# Patient Record
Sex: Female | Born: 1970 | Race: White | Hispanic: No | Smoking: Current some day smoker
Health system: Southern US, Community
[De-identification: ages and names within clinical notes are randomized; demographics above are authoritative.]

## PROBLEM LIST (undated history)

## (undated) DIAGNOSIS — J45909 Unspecified asthma, uncomplicated: Secondary | ICD-10-CM

## (undated) DIAGNOSIS — Q211 Atrial septal defect: Secondary | ICD-10-CM

## (undated) DIAGNOSIS — Z720 Tobacco use: Secondary | ICD-10-CM

## (undated) DIAGNOSIS — Z8709 Personal history of other diseases of the respiratory system: Secondary | ICD-10-CM

## (undated) DIAGNOSIS — I314 Cardiac tamponade: Secondary | ICD-10-CM

## (undated) DIAGNOSIS — Z8679 Personal history of other diseases of the circulatory system: Secondary | ICD-10-CM

## (undated) DIAGNOSIS — F1911 Other psychoactive substance abuse, in remission: Secondary | ICD-10-CM

## (undated) DIAGNOSIS — B1920 Unspecified viral hepatitis C without hepatic coma: Secondary | ICD-10-CM

## (undated) DIAGNOSIS — R413 Other amnesia: Secondary | ICD-10-CM

## (undated) DIAGNOSIS — Q2112 Patent foramen ovale: Secondary | ICD-10-CM

## (undated) DIAGNOSIS — B181 Chronic viral hepatitis B without delta-agent: Secondary | ICD-10-CM

## (undated) DIAGNOSIS — G8929 Other chronic pain: Secondary | ICD-10-CM

## (undated) DIAGNOSIS — I469 Cardiac arrest, cause unspecified: Secondary | ICD-10-CM

## (undated) DIAGNOSIS — N179 Acute kidney failure, unspecified: Secondary | ICD-10-CM

## (undated) DIAGNOSIS — F419 Anxiety disorder, unspecified: Secondary | ICD-10-CM

## (undated) DIAGNOSIS — R519 Headache, unspecified: Secondary | ICD-10-CM

## (undated) DIAGNOSIS — M1990 Unspecified osteoarthritis, unspecified site: Secondary | ICD-10-CM

## (undated) DIAGNOSIS — F32A Depression, unspecified: Secondary | ICD-10-CM

## (undated) DIAGNOSIS — R51 Headache: Secondary | ICD-10-CM

## (undated) DIAGNOSIS — K219 Gastro-esophageal reflux disease without esophagitis: Secondary | ICD-10-CM

## (undated) DIAGNOSIS — I2690 Septic pulmonary embolism without acute cor pulmonale: Secondary | ICD-10-CM

## (undated) DIAGNOSIS — F329 Major depressive disorder, single episode, unspecified: Secondary | ICD-10-CM

## (undated) DIAGNOSIS — I071 Rheumatic tricuspid insufficiency: Secondary | ICD-10-CM

## (undated) DIAGNOSIS — Z87440 Personal history of urinary (tract) infections: Secondary | ICD-10-CM

## (undated) DIAGNOSIS — I38 Endocarditis, valve unspecified: Secondary | ICD-10-CM

## (undated) HISTORY — DX: Personal history of urinary (tract) infections: Z87.440

## (undated) HISTORY — DX: Patent foramen ovale: Q21.12

## (undated) HISTORY — DX: Tobacco use: Z72.0

## (undated) HISTORY — DX: Other chronic pain: G89.29

## (undated) HISTORY — DX: Personal history of other diseases of the respiratory system: Z87.09

## (undated) HISTORY — DX: Atrial septal defect: Q21.1

## (undated) HISTORY — DX: Endocarditis, valve unspecified: I38

## (undated) HISTORY — PX: CARDIAC CATHETERIZATION: SHX172

## (undated) HISTORY — DX: Unspecified viral hepatitis C without hepatic coma: B19.20

## (undated) HISTORY — DX: Other psychoactive substance abuse, in remission: F19.11

## (undated) HISTORY — DX: Chronic viral hepatitis B without delta-agent: B18.1

## (undated) HISTORY — PX: DILATION AND CURETTAGE OF UTERUS: SHX78

## (undated) HISTORY — DX: Headache, unspecified: R51.9

## (undated) HISTORY — DX: Headache: R51

## (undated) HISTORY — DX: Personal history of other diseases of the circulatory system: Z86.79

---

## 1998-03-24 ENCOUNTER — Other Ambulatory Visit: Admission: RE | Admit: 1998-03-24 | Discharge: 1998-03-24 | Payer: Self-pay | Admitting: Obstetrics & Gynecology

## 1998-04-04 ENCOUNTER — Inpatient Hospital Stay (HOSPITAL_COMMUNITY): Admission: AD | Admit: 1998-04-04 | Discharge: 1998-04-04 | Payer: Self-pay | Admitting: Obstetrics and Gynecology

## 1998-04-12 ENCOUNTER — Inpatient Hospital Stay (HOSPITAL_COMMUNITY): Admission: AD | Admit: 1998-04-12 | Discharge: 1998-04-12 | Payer: Self-pay | Admitting: Obstetrics & Gynecology

## 1998-04-18 ENCOUNTER — Inpatient Hospital Stay (HOSPITAL_COMMUNITY): Admission: AD | Admit: 1998-04-18 | Discharge: 1998-04-18 | Payer: Self-pay | Admitting: Obstetrics and Gynecology

## 1998-04-19 ENCOUNTER — Inpatient Hospital Stay (HOSPITAL_COMMUNITY): Admission: AD | Admit: 1998-04-19 | Discharge: 1998-04-22 | Payer: Self-pay | Admitting: Obstetrics and Gynecology

## 1998-06-03 ENCOUNTER — Other Ambulatory Visit: Admission: RE | Admit: 1998-06-03 | Discharge: 1998-06-03 | Payer: Self-pay | Admitting: Obstetrics and Gynecology

## 1998-10-13 ENCOUNTER — Emergency Department (HOSPITAL_COMMUNITY): Admission: EM | Admit: 1998-10-13 | Discharge: 1998-10-13 | Payer: Self-pay | Admitting: Emergency Medicine

## 1998-11-05 ENCOUNTER — Inpatient Hospital Stay (HOSPITAL_COMMUNITY): Admission: EM | Admit: 1998-11-05 | Discharge: 1998-11-06 | Payer: Self-pay | Admitting: Emergency Medicine

## 1999-01-11 ENCOUNTER — Emergency Department (HOSPITAL_COMMUNITY): Admission: EM | Admit: 1999-01-11 | Discharge: 1999-01-11 | Payer: Self-pay | Admitting: Emergency Medicine

## 1999-08-31 ENCOUNTER — Inpatient Hospital Stay (HOSPITAL_COMMUNITY): Admission: AD | Admit: 1999-08-31 | Discharge: 1999-08-31 | Payer: Self-pay | Admitting: Obstetrics

## 1999-09-01 ENCOUNTER — Inpatient Hospital Stay (HOSPITAL_COMMUNITY): Admission: EM | Admit: 1999-09-01 | Discharge: 1999-09-01 | Payer: Self-pay | Admitting: *Deleted

## 1999-09-09 ENCOUNTER — Inpatient Hospital Stay (HOSPITAL_COMMUNITY): Admission: AD | Admit: 1999-09-09 | Discharge: 1999-09-09 | Payer: Self-pay | Admitting: Obstetrics

## 1999-09-09 ENCOUNTER — Emergency Department (HOSPITAL_COMMUNITY): Admission: EM | Admit: 1999-09-09 | Discharge: 1999-09-09 | Payer: Self-pay | Admitting: Emergency Medicine

## 1999-10-28 ENCOUNTER — Inpatient Hospital Stay (HOSPITAL_COMMUNITY): Admission: AD | Admit: 1999-10-28 | Discharge: 1999-10-28 | Payer: Self-pay | Admitting: Obstetrics & Gynecology

## 1999-12-11 ENCOUNTER — Encounter: Payer: Self-pay | Admitting: Emergency Medicine

## 1999-12-11 ENCOUNTER — Emergency Department (HOSPITAL_COMMUNITY): Admission: EM | Admit: 1999-12-11 | Discharge: 1999-12-11 | Payer: Self-pay | Admitting: Emergency Medicine

## 2000-04-12 ENCOUNTER — Emergency Department (HOSPITAL_COMMUNITY): Admission: EM | Admit: 2000-04-12 | Discharge: 2000-04-12 | Payer: Self-pay | Admitting: Emergency Medicine

## 2000-04-13 ENCOUNTER — Emergency Department (HOSPITAL_COMMUNITY): Admission: EM | Admit: 2000-04-13 | Discharge: 2000-04-13 | Payer: Self-pay | Admitting: Emergency Medicine

## 2000-04-13 ENCOUNTER — Encounter: Payer: Self-pay | Admitting: Emergency Medicine

## 2000-06-15 ENCOUNTER — Inpatient Hospital Stay (HOSPITAL_COMMUNITY): Admission: AD | Admit: 2000-06-15 | Discharge: 2000-06-15 | Payer: Self-pay | Admitting: Obstetrics

## 2000-06-21 ENCOUNTER — Emergency Department (HOSPITAL_COMMUNITY): Admission: EM | Admit: 2000-06-21 | Discharge: 2000-06-21 | Payer: Self-pay | Admitting: Emergency Medicine

## 2000-08-10 ENCOUNTER — Emergency Department (HOSPITAL_COMMUNITY): Admission: EM | Admit: 2000-08-10 | Discharge: 2000-08-10 | Payer: Self-pay | Admitting: Emergency Medicine

## 2000-11-08 ENCOUNTER — Emergency Department (HOSPITAL_COMMUNITY): Admission: EM | Admit: 2000-11-08 | Discharge: 2000-11-09 | Payer: Self-pay | Admitting: Emergency Medicine

## 2000-11-09 ENCOUNTER — Emergency Department (HOSPITAL_COMMUNITY): Admission: EM | Admit: 2000-11-09 | Discharge: 2000-11-09 | Payer: Self-pay | Admitting: Emergency Medicine

## 2001-03-23 ENCOUNTER — Emergency Department (HOSPITAL_COMMUNITY): Admission: EM | Admit: 2001-03-23 | Discharge: 2001-03-23 | Payer: Self-pay | Admitting: Emergency Medicine

## 2001-03-23 ENCOUNTER — Encounter: Payer: Self-pay | Admitting: Emergency Medicine

## 2001-08-14 ENCOUNTER — Emergency Department (HOSPITAL_COMMUNITY): Admission: EM | Admit: 2001-08-14 | Discharge: 2001-08-15 | Payer: Self-pay | Admitting: Emergency Medicine

## 2001-09-16 ENCOUNTER — Emergency Department (HOSPITAL_COMMUNITY): Admission: EM | Admit: 2001-09-16 | Discharge: 2001-09-16 | Payer: Self-pay | Admitting: *Deleted

## 2001-09-21 ENCOUNTER — Emergency Department (HOSPITAL_COMMUNITY): Admission: EM | Admit: 2001-09-21 | Discharge: 2001-09-22 | Payer: Self-pay | Admitting: *Deleted

## 2002-09-08 ENCOUNTER — Emergency Department (HOSPITAL_COMMUNITY): Admission: EM | Admit: 2002-09-08 | Discharge: 2002-09-08 | Payer: Self-pay | Admitting: Emergency Medicine

## 2002-10-11 ENCOUNTER — Emergency Department (HOSPITAL_COMMUNITY): Admission: EM | Admit: 2002-10-11 | Discharge: 2002-10-11 | Payer: Self-pay | Admitting: Emergency Medicine

## 2002-11-18 ENCOUNTER — Emergency Department (HOSPITAL_COMMUNITY): Admission: EM | Admit: 2002-11-18 | Discharge: 2002-11-18 | Payer: Self-pay | Admitting: Emergency Medicine

## 2003-02-19 ENCOUNTER — Inpatient Hospital Stay (HOSPITAL_COMMUNITY): Admission: AD | Admit: 2003-02-19 | Discharge: 2003-02-19 | Payer: Self-pay | Admitting: *Deleted

## 2003-08-08 ENCOUNTER — Inpatient Hospital Stay (HOSPITAL_COMMUNITY): Admission: AD | Admit: 2003-08-08 | Discharge: 2003-08-09 | Payer: Self-pay | Admitting: Obstetrics and Gynecology

## 2003-08-08 ENCOUNTER — Encounter: Payer: Self-pay | Admitting: Obstetrics and Gynecology

## 2004-05-02 ENCOUNTER — Emergency Department (HOSPITAL_COMMUNITY): Admission: EM | Admit: 2004-05-02 | Discharge: 2004-05-02 | Payer: Self-pay | Admitting: Emergency Medicine

## 2004-06-21 ENCOUNTER — Emergency Department (HOSPITAL_COMMUNITY): Admission: EM | Admit: 2004-06-21 | Discharge: 2004-06-21 | Payer: Self-pay | Admitting: Emergency Medicine

## 2005-01-16 ENCOUNTER — Emergency Department (HOSPITAL_COMMUNITY): Admission: EM | Admit: 2005-01-16 | Discharge: 2005-01-16 | Payer: Self-pay | Admitting: Emergency Medicine

## 2005-02-08 ENCOUNTER — Emergency Department (HOSPITAL_COMMUNITY): Admission: EM | Admit: 2005-02-08 | Discharge: 2005-02-08 | Payer: Self-pay | Admitting: Emergency Medicine

## 2005-02-26 ENCOUNTER — Emergency Department (HOSPITAL_COMMUNITY): Admission: EM | Admit: 2005-02-26 | Discharge: 2005-02-26 | Payer: Self-pay | Admitting: Emergency Medicine

## 2006-02-24 ENCOUNTER — Encounter: Payer: Self-pay | Admitting: Emergency Medicine

## 2006-02-25 ENCOUNTER — Inpatient Hospital Stay (HOSPITAL_COMMUNITY): Admission: EM | Admit: 2006-02-25 | Discharge: 2006-06-14 | Payer: Self-pay | Admitting: Internal Medicine

## 2006-02-25 ENCOUNTER — Ambulatory Visit: Payer: Self-pay | Admitting: Pulmonary Disease

## 2006-02-25 ENCOUNTER — Ambulatory Visit: Payer: Self-pay | Admitting: Cardiology

## 2006-02-28 ENCOUNTER — Ambulatory Visit: Payer: Self-pay | Admitting: Emergency Medicine

## 2006-02-28 ENCOUNTER — Ambulatory Visit: Payer: Self-pay | Admitting: Infectious Diseases

## 2006-02-28 ENCOUNTER — Ambulatory Visit: Payer: Self-pay | Admitting: Internal Medicine

## 2006-03-04 ENCOUNTER — Encounter: Payer: Self-pay | Admitting: Internal Medicine

## 2006-04-12 ENCOUNTER — Ambulatory Visit: Payer: Self-pay | Admitting: Infectious Diseases

## 2006-04-22 HISTORY — PX: PATENT FORAMEN OVALE CLOSURE: SHX2181

## 2006-04-22 HISTORY — PX: TRICUSPID VALVE SURGERY: SHX817

## 2006-05-07 HISTORY — PX: SUBXYPHOID PERICARDIAL WINDOW: SHX5075

## 2006-05-24 ENCOUNTER — Ambulatory Visit: Payer: Self-pay | Admitting: Infectious Diseases

## 2006-06-02 ENCOUNTER — Ambulatory Visit: Payer: Self-pay | Admitting: Physical Medicine & Rehabilitation

## 2006-06-15 ENCOUNTER — Inpatient Hospital Stay (HOSPITAL_COMMUNITY): Admission: EM | Admit: 2006-06-15 | Discharge: 2006-07-01 | Payer: Self-pay | Admitting: Emergency Medicine

## 2006-06-16 ENCOUNTER — Ambulatory Visit: Payer: Self-pay | Admitting: Dentistry

## 2009-08-06 ENCOUNTER — Telehealth: Payer: Self-pay | Admitting: Internal Medicine

## 2009-08-11 ENCOUNTER — Ambulatory Visit: Payer: Self-pay | Admitting: Cardiovascular Disease

## 2009-09-16 ENCOUNTER — Ambulatory Visit: Payer: Self-pay | Admitting: Cardiovascular Disease

## 2010-11-22 ENCOUNTER — Encounter: Payer: Self-pay | Admitting: Internal Medicine

## 2011-03-16 NOTE — Assessment & Plan Note (Signed)
[facility]                        [place] CARDIOLOGY OFFICE NOTE   Fitzgerald, Tina                       MRN:          [id number]  DATE:09/16/2009                            DOB:          12-31-1970    PROBLEM LIST:   1. h/o TV endocarditis with complicated course  2. PFO s/p closure  3. Hep B & C   INTERVAL HISTORY:  The patient states that since her last visit her  chest pain has greatly improved.  She no longer experiences any chest  discomfort during the day.  She still experiences some chest discomfort  at night when lying down, but it does not significantly bother her.  She  does housework during the day and is able to complete this without any  significant dyspnea on exertion.  She does not perform any other  significant physical activity.  She also continues to smoke cigarettes.   PHYSICAL EXAMINATION:  VITAL SIGNS:  Blood pressure 117/78, pulse 59,  sating 98% on room air, weight is 160 pounds.  GENERAL:  No acute distress.  HEENT:  Normocephalic, atraumatic.  HEART:  Very distant heart sounds.  There is regular rate and rhythm.  LUNGS:  Decreased breath sounds left base otherwise clear bilaterally.  ABDOMEN:  Soft, nontender.  EXTREMITIES:  Without edema.  SKIN:  Warm and dry.  PSYCHIATRIC:  The patient is appropriate.   LABORATORY DATA:  Review of the patient's chest x-ray shows no evidence  of cardiovascular disease, bilateral mammography showed no evidence of  malignancy, transthoracic echocardiogram showed an ejection fraction of  55-65%, and mildly dilated right ventricle and right atrium with  moderate-to-severe TR.  Of note, the patient's echocardiogram from the  2007 also showed dilated right heart and severe tricuspid regurgitation.  The patient's blood cultures have been negative x2.   ASSESSMENT AND PLAN:  The patient appears to do well from cardiovascular  standpoint.  Her chest pain that was musculoskeletal in  origin seems to  have resolved except for some persistent discomfort at night with lying  down.  Her workup for recurrent endocarditis has been negative and the  patient is not having any current signs or symptoms consistent with that  diagnosis.  She continues to smoke cigarettes and she is encouraged to  consider quitting;  however, considering her history of polysubstance abuse in the past, she  has made significant  strides in this regard, as she has been clean for approximately 4 years.  The patient is encouraged to obtain a primary care physician and she  states they will do so.     Brayton El, MD  Electronically Signed    SGA/MedQ  DD: 09/16/2009  DT: 09/17/2009  Job #: [id number]

## 2011-03-16 NOTE — Assessment & Plan Note (Signed)
[facility]                        [place] CARDIOLOGY OFFICE NOTE   Tina Fitzgerald, Tina Fitzgerald                       MRN:          [id number]  DATE:08/11/2009                            DOB:          12/05/1970    CHIEF COMPLAINT:  Chest pain and fatigue.   HISTORY OF PRESENT ILLNESS:  Tina Fitzgerald is a 40 year old white female  with past medical history significant for tricuspid valve methicillin-  sensitive endocarditis, cardiac tamponade, PFO status post surgical  closure, hepatitis B and C, history of polysubstance abuse, who is  presenting with right- sided chest pain as well as increased fatigue  over the past 4 weeks.  The patient states that for the past 4 weeks,  she has had a discomfort beneath the medial aspect of her right breast.  The pain is often exacerbated by bending over and lying down at night.  She also, however, feels the pain intermittently throughout the day.  She states it can last hours at that time and is often worsened by  movement of her torso.  She denies any radiation of the pain or  associated symptoms, although she does state that she has some chronic  issues with dizziness.  The patient also states that for the past 4  weeks, she has had significantly increased fatigue.  She denies any  increase in dyspnea on exertion, lower extremity edema, syncope, PND, or  orthopnea.  She also denies any fevers, but does occasionally have some  chills.  She has not seen a physician since 2007 when she had surgical  debridement of her tricuspid valve regurgitation, had a  pericardiocentesis, and surgical closure of a patent foramen ovale.  The  patient has a history of polysubstance abuse, but has not used any drugs  since then and prior to the past 4 months, she was in her normal state  of health.  Lastly, the patient does deny any trauma to the area.   PAST MEDICAL HISTORY:  Methicillin-sensitive tricuspid valve  endocarditis, status  post debridement of the valve.  The postop course  was complicated by pericardial tamponade.  The patient also had a PFO  that was closed during the surgery.  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  She  had a large pericardial effusion is status post subxiphoid window.  She  has a history of enterococcal bacteremia and fungemia, history of  hepatitis B and C, history of acute renal failure, ventilatory dependent  respiratory failure, history of septic pulmonary emboli.   SOCIAL HISTORY:  History of polysubstance abuse, however, she now lives  with her mother and has not used substances in the past 3 years.  She  does smoke tobacco, but denies any alcohol use.   FAMILY HISTORY:  Negative for premature coronary disease and negative  for malignancy.   ALLERGIES:  No known drug allergies.   MEDICATIONS:  The patient is currently not taking any medications except  Goody Powders p.r.n.   REVIEW OF SYSTEMS:  The patient states that her appetite has been  increased recently in the evenings.  She also endorses anxiety-like  symptoms and occasional headaches.  Other systems as in HPI, otherwise  negative.   PHYSICAL EXAMINATION:  VITAL SIGNS:  Blood pressure is 116/80, pulse is  72, she weighs 161 pounds, and she is sating 99% on room air.  GENERAL:  She is in no acute distress.  HEENT:  Nonfocal.  NECK:  Supple.  There are no carotid bruits.  There is no JVD.  HEART:  Regular rate and rhythm with a 1/6 systolic murmur heard at the  left sternal border.  LUNGS:  Decreased breath sounds at the left base, otherwise clear to  auscultation.  ABDOMEN:  Soft and nontender.  EXTREMITIES:  Without edema.  SKIN:  Cool and dry.  PSYCHIATRIC:  The patient is appropriate.  NEURO:  The patient does have some gait instability.  MUSCULOSKELETAL:  The patient has tenderness to palpation over the  medial portion of the right breast  extending to the sternum.  There are  no masses in that area that are palpated, but the breast tissue does  appear to be tender.   EKG from today independently reviewed by myself demonstrates normal  sinus rhythm with a incomplete right-bundle branch block.  Review of the  patient's medical history as above.   ASSESSMENT AND PLAN:  1. Chest discomfort.  The patient's chest discomfort seems      reproducible on exam.  It is an area with overlying breast tissue.      We will proceed with a PA and lateral chest x-ray as well as a      screening mammography to further evaluate this.  It certainly      appears not to be cardiac in origin.  We recommend that she take      anti-inflammatory medications such as Aleve for this problem.  2. Increased fatigue and other constitutional symptoms.  The patient      has a history of endocarditis and is therefore at increase risk for      recurrence.  Today, we will check an echocardiogram, blood cultures      x2, CMP, CBC, TSH.  We will see the patient back in clinic after      the results of these studies are obtained.     Brayton El, MD  Electronically Signed    SGA/MedQ  DD: 08/11/2009  DT: 08/12/2009  Job #: [id number]

## 2011-03-19 NOTE — Discharge Summary (Signed)
NAME:  Tina Fitzgerald, Tina Fitzgerald                   ACCOUNT NO.:  [id number]   MEDICAL RECORD NO.:  [id number]                   PATIENT TYPE:   LOCATION:                                       FACILITY:   PHYSICIAN:  Phil D. Rose, M.D.                  DATE OF BIRTH:  1970-12-26   DATE OF ADMISSION:  08/08/2003  DATE OF DISCHARGE:  08/09/2003                                 DISCHARGE SUMMARY   DATE THE PATIENT LEFT THE HOSPITAL:  August 09, 2003   HOSPITAL COURSE:  The patient is a 40 year old gravida 56, para 3-2-4-5 with  four therapeutic abortions at 30-5/7 weeks came into the MAU with fever,  nausea, vomiting for two weeks, marked dehydration with a history of illicit  drug use heroin and cocaine, refused a drug screen, and was admitted for  hydration and diagnosis.  The patient, however, the next day left the  hospital against medical advice and it is not known whether she will appear  in the clinic as directed or not.                                               Phil D. Okey Dupre, M.D.    PDR/MEDQ  D:  09/20/2003  T:  09/20/2003  Job:  [id number]

## 2011-03-19 NOTE — Consult Note (Signed)
NAMELUDELLA, PRANGER NO.:  [id number]   MEDICAL RECORD NO.:  [id number]          PATIENT TYPE:  INP   LOCATION:  5024                         FACILITY:  [facility]   PHYSICIAN:  Charlynne Pander, D.D.S.DATE OF BIRTH:  1971-04-30   DATE OF CONSULTATION:  06/16/2006  DATE OF DISCHARGE:                                   CONSULTATION   Tina Fitzgerald is a 40 year old female referred by Dr. Hannah Beat for a  dental consultation.  Patient with a history of falling and having trauma to  the mid face region where she suffered multiple abrasions and lacerations  and trauma to maxillary tooth #9.  Dental consultation requested to evaluate  the dental trauma.   MEDICAL HISTORY:  1. Status post fall with questionable syncope.  The patient suffered      multiple traumas including abrasions, lacerations, fracture of tooth #9      and a minimally displaced fracture of the frontal process of the left      maxilla per CT scan.  2. Cognitive defect possibly from previous anoxic injury.  3. History of tricuspid valve endocarditis with the previous Enterococcus      infection.  The patient is status post tricuspid valve debridement with      Dr. Tressie Stalker.  The patient does need antibiotic premed prior to      invasive dental procedures as part of the [employer] Association      guidelines.  4. History of IV drug abuse.  5. History of being hepatitis B and hepatitis C positive.  6. History of anemia of chronic disease.  7. History of septic emboli the lungs.  8. History of back pain.   ALLERGIES/DRUG REACTIONS:  None known.   MEDICATIONS:  1. Depakote 250 mg 3 times daily.  2. Lexapro 10 mg daily.  3. Senokot-S one at bedtime.  4. Nicotine patch daily.   SOCIAL HISTORY:  A patient with a history of previous IV drug abuse.  Apparently she has not had access to this since her previous  hospitalization.  Mother appears to be involved in her care and can be  contacted at [phone], extension [phone] or cell phone [phone].  The mother's  name is Lemont Fillers.   FAMILY HISTORY:  Noncontributory.   FUNCTION ASSESSMENT:  The patient is dependent for multiple ADLs.   REVIEW OF SYSTEMS:  This is reviewed as best possible from health history  assessment form and previous hospitalization notes.   DENTAL HISTORY:   CHIEF COMPLAINT:  Patient with a history of fall where she suffered multiple  abrasions and lacerations and trauma to tooth #9.  CT scan was done and  found to have minimally displaced fracture of the frontal process of the  left maxilla.  This was discuss with Dr. Narda Bonds and no other treatment  was deemed to be necessary.  Dental consultation requested for evaluation of  a loose tooth #9.   The patient does not appear to be a very good historian at this time and  indicates that she did see a  dentist approximately 1 year ago.  The patient  indicates that she lost a crown or traumatized tooth number 9 during the  fall.  The patient denies having any dental pain at this time from this  tooth.  The tooth is somewhat loose and may be suffering from a root  fracture.  Dental x-ray will be ordered at this time.   DENTAL EXAMINATIONS:  GENERAL:  The patient is a well-developed, slightly  built female in no acute distress.  VITAL SIGNS:  Blood pressure is 96/68, pulse 93, respirations are 20,  temperature is 97.1.  On exam, there is no palpable submandibular lymphadenopathy.  There are no  acute TMJ symptoms.  The patient denies sensitivity to the TMJ areas.  INTRAORAL EXAM:  The patient has normal saliva.  There is no evidence of  abscess formation within the mouth.  The occlusion appears to be acceptable  and there do not appear to be any steps within the remaining maxilla and  mandible areas.  DENTITION:  Tooth #9 coronal segment appears to be loose.  The patient may  be suffering from a loose core portion of the post and core or there  may be  some form of root fracture.  Dental x-rays will be required.  The patient is  missing multiple teeth and appears to be status post orthodontic therapy.  Occlusion appears to be intact at this time.  PERIODONTAL:  Patient with chronic periodontitis with plaque and calculus  accumulations  DENTAL CARIES:  There are no obvious dental caries noted at this time.  ENDODONTIC:  The patient currently denies acute pulpitis symptoms.  Tooth #9  appears to have had a previous root canal therapy and either has a fractured  core portion of a post and core or possible root fracture.  Definitive  dental x-ray will be required.  CROWN OR BRIDGE:  The patient may have had a crown on tooth #9 previously  but this is no longer in place.  PROSTHODONTIC:  There are no dentures.  OCCLUSION:  The occlusion appears to be stable at this time with no evidence  of mandible fracture.  I am unable to palpate any significant steps or  alveolar fracture at this time.   RADIOGRAPHIC INTERPRETATION:  Panoramic x-ray was ordered for June 16, 2006 through the Department of Radiology.   There are multiple missing teeth.  The patient appears to have had  orthodontics therapy with closing of multiple spaces.  There is no obvious  periapical pathology.  Tooth #9 may be suffering from a root fracture and a  periapical radiographs is recommended.  This will need to be done in a  hospital dental clinic as can be arranged at [facility].   ASSESSMENT:  1. History of dental trauma from a fall.  Patient with multiple facial      lacerations and abrasions and trauma to tooth #9.  Will need dental      periapical radiographs at the hospital dental clinic at [facility].      Will discuss with Dr. Angelena Sole to arrange further evaluation of this      tooth.  It may need to be extracted or may need to follow up with a      dentist of her choice for evaluation for further root canal therapy     post-and-core fabrication and  subsequent new crown fabrication.  2. Chronic periodontitis with bone loss.  3. Plaque and calculus accumulations.  4. Multiple missing  teeth status post orthodontic therapy with an      apparently stable occlusion.  5. 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  Need to      assess who is the patient's decision maker at this time.  6. History of hepatitis B and C with risk for bleeding with invasive      dental procedures.  7. History of tricuspid valve endocarditis and need for antibiotic      premedication prior to invasive dental procedures   PLAN/RECOMMENDATIONS:  I discussed the risks, benefits and complications of  the various treatment options with the patient in relationship to her  medical and dental conditions. The next is to further evaluate the trauma to  the maxillary anterior area with selective dental x-rays.  The patient will  need to be transported via [facility] to the hospital dental clinic as time  and space can be arranged to allow for dental x-rays and treatment as  indicated.  I will discuss with Dr. Angelena Sole further.  In the meantime, the  patient denies acute pain from this area and this is consistent with  previous root canal therapy to the area.  This tooth should pose minimal  risk for aspiration but this needs to be followed closely.      Charlynne Pander, D.D.S.  Electronically Signed     RFK/MEDQ  D:  06/17/2006  T:  06/17/2006  Job:  [id number]   cc:   Hettie Holstein, D.O.

## 2011-03-19 NOTE — Op Note (Signed)
Tina Fitzgerald, Tina Fitzgerald NO.:  [id number]   MEDICAL RECORD NO.:  [id number]          PATIENT TYPE:  INP   LOCATION:  5024                         FACILITY:  [facility]   PHYSICIAN:  Charlynne Pander, D.D.S.DATE OF BIRTH:  1970-11-25   DATE OF PROCEDURE:  06/20/2006  DATE OF DISCHARGE:                                 OPERATIVE REPORT   PREOPERATIVE DIAGNOSES:  1. Status post fall with trauma to the maxillary anterior tooth #9.  2. Vertical root fracture making tooth #9 nonrestorable.   POSTOPERATIVE DIAGNOSES:  1. Status post fall with trauma to the maxillary anterior tooth #9.  2. Vertical root fracture making tooth #9 nonrestorable.   PROCEDURES:  1. Dental examination.  2. Periapical radiographs of tooth #9.  3. Surgical extraction of tooth #9 with alveoloplasty.   SURGEON:  Charlynne Pander, D.D.S.   ASSISTANT:  Elliot Dally (Sales executive).   ANESTHESIA:  Local anesthesia with a total utilization of 36 mg of Xylocaine  with 0.018 mg of epinephrine.   MEDICATIONS:  1. Amoxicillin 2.0 grams by mouth 1 hour prior to invasive dental      procedures.  2. Local anesthesia as above.   SPECIMENS:  There was one root segment which was discarded.   CULTURES:  None.   DRAINS:  None.   COMPLICATIONS:  None.   ESTIMATED BLOOD LOSS:  Less than 15 mls.   FLUIDS:  None.   INDICATIONS:  The patient was admitted secondary to a fall with multiple  facial lacerations and abrasions with trauma to tooth #9.  The patient was  examined at [facility] room 3708.  At that time tooth  #9 was loose; and a periapical radiograph needed to be obtained to determine  if the core portion was loose or if there was a root fracture.  The patient  was then scheduled to be transported by [facility] to the hospital dental  clinic for dental x-ray, examination, and discussion of treatment options.  A periapical radiograph was obtained today; and with  examination, it was  determined that tooth #9 was not restorable due to a vertical root fracture  measuring at least 7 mm below the gum line.  The various treatment options  were discussed with the patient.  The various treatment options and  potential complications and risks and benefits were also discussed with the  mother via the phone.  Both gave consent for surgical extraction with  alveoloplasty as indicated.  Both agreed that they would followup with a  general dentist of their choice for evaluation for replacement of the tooth  with a bridge or with an implant and crown.  The patient, therefore, was  noted to be affected by history of dental trauma with a vertical root  fracture making the tooth #9 nonrestorable..   OPERATIVE FINDINGS:  The patient was examined in the dental clinic.  Tooth  #9 was identified for extraction.  The patient was noted be affected by  dental trauma and a vertical root fracture, making the tooth nonrestorable.  The aforementioned necessitated removal of tooth #  9, at this time.   DESCRIPTION OF PROCEDURE:  The patient was brought to the dental clinic.  A  dental x-ray and examination were performed.  An informed consent was  obtained through the patient and through the mother.  The patient was then  ready to proceed with a dental extraction.  The patient was prepped and  draped in the usual manner for a dental medicine procedure.  The patient was  anesthetized utilizing infiltration on the facial and palatal aspects of  tooth #9 with 36 mg of Xylocaine with 0.018 mg of epinephrine.  A Woodson  elevator was utilized to remove the soft tissue from the hard tissue.  The  piece of the distal root was then removed with a rongeurs.  Tooth #9 was  then subluxated carefully with a series of small elevators.  Tooth #9 was  then carefully removed atraumatically with a 150 forceps without further  complications.  The socket was curetted and compressed appropriately.   The  socket was irrigated with sterile saline.  A piece of Gelfoam was placed in  the extraction socket to aid hemostasis.  Surgical site was then closed  utilizing 3-0 chromic gut suture in a figure-of-eight suture technique x1.  The patient was given postop instructions, verbally; and these instructions  were then placed on the hospital chart.  The patient then had a 4 x 4 gauze  placed to aid hemostasis.  The patient was instructed to avoid straws and  vigorous swishing, spitting, or smoking at this time.  The suture will  dissolve by itself in approximately 1 week.  There were no complications  with the procedure.  The patient tolerated the procedure well.  Postop blood  pressure and pulse were acceptable.  The patient then had a list of postop  instructions placed on the hospital chart.  The patient will be stable for  discharge from a dental standpoint at this time.      Charlynne Pander, D.D.S.  Electronically Signed     RFK/MEDQ  D:  06/20/2006  T:  06/20/2006  Job:  [id number]   cc:   Hettie Holstein, D.O.

## 2011-03-19 NOTE — Consult Note (Signed)
[name], Fitzgerald NO.:  [id number]   MEDICAL RECORD NO.:  [id number]          PATIENT TYPE:  INP   LOCATION:  5024                         FACILITY:  [facility]   PHYSICIAN:  Antonietta Breach, M.D.  DATE OF BIRTH:  10/30/1971   DATE OF CONSULTATION:  06/23/2006  DATE OF DISCHARGE:                                   CONSULTATION   [name] continues with impaired short-term memory.  She demonstrates  impaired judgment by not being able to follow medical recommendations,  although she does not have the capacity to function independently outside  the hospital, she continues to forget that she has been instructed to stay  on the ward and she repeatedly tries to go out on the elevators.   She told a nurse today, as well as the undersigned, that she had been out of  the hospital for 5 hours and had been driven by a female friend around town to  run some errands.  See the mental status below.   The patient's mood is within normal limits.  She is not combative.  She is  not having any hallucinations or delusions.  She is redirectable.  She is  not having any adverse psychotropic effects.   Mental status exam:  [name] is alert.  She is demonstrating some mild  distractibility on concentration.  Thought process involves a partial  confabulation, particularly when she does not recall things.   Thought content:  No thoughts of harming herself, no thoughts of harming  others.  No delusions, no hallucinations.  She has short-term deficits and  cannot recall what date it is.  She does know it is August.  She does not  know the day of the month.  When she was asked where she was at, she said  [place] and then was able to correct herself as the conversation  proceeded and she realized that she was in a hospital.  Then, she was able  to say the name of the hospital.  Her judgment is impaired.  Her insight is  poor.   ASSESSMENT:  1. Unspecified persistent mental  disorder, NOS-294.9.  2. The patient has some dementia symptoms.  3. Bipolar disorder not otherwise specified.  4. Cocaine dependence.   RECOMMENDATIONS:  1. Would continue the Depakote at 250 mg t.i.d. as the primary mood      stabilizer with periodic checking of CBC and LFTs for Depakote      screening.  2. Would continue the Lexapro 10 mg daily for anti-depression and would      schedule the patient with the [facility] for      psychotropic medication follow up.  3. Would also schedule the patient for alcohol drug services follow up,      ADS.  4. The patient's capacity to respond to self-help groups such as 12-step      method and psychotherapy is limited at this time.  However, if her      capacity improves, she would then again be a candidate for 12-step  groups and substance abuse counseling.  5. Would confirm that the reversible memory dysfunction etiology workup      has been completed including RPR, thyroid function tests, B12, and      folic acid.  6. Would consider Aricept and/or Namenda therapy if the reversible memory      workup is negative.   DISCUSSION:  [name] has critical deficits in short-term memory,  reasoning, and judgment.  She does not have the capacity for informed  consent.  She does not have the capacity to provide guardianship.  She also  does not have the capacity to take care of administrative and financial  affairs.      Antonietta Breach, M.D.  Electronically Signed     JW/MEDQ  D:  06/23/2006  T:  06/24/2006  Job:  [id number]

## 2011-03-19 NOTE — H&P (Signed)
NAMEMATRACA, HUNKINS NO.:  [id number]   MEDICAL RECORD NO.:  [id number]          PATIENT TYPE:  INP   LOCATION:  1829                         FACILITY:  [facility]   PHYSICIAN:  Hettie Holstein, D.O.    DATE OF BIRTH:  09/10/1971   DATE OF ADMISSION:  06/15/2006  DATE OF DISCHARGE:                                HISTORY & PHYSICAL   PRIMARY CARE PHYSICIAN:  Unassigned.   CHIEF COMPLAINT:  Status post fall, questionable syncope.   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  For  full details, please refer to the recent summations done over the past few  weeks and also in the past medical history outlined further below.   Tina Fitzgerald was discharged in medically stable condition to [facility].  She had suffered a mild anoxic injury during her recent  hospitalization felt to be related to hypoperfusion in the setting of a  cardiac tamponade, who subsequently underwent a pericardial window with  resolution.  In any event, at the baseline she had initially appeared with  some nonspecific cognitive deficits.  She had seen Psychiatry, Dr.  Jeanie Sewer, during her hospitalization, who had initiated Depakote and  Lexapro to continue for at least the next month.  In any event, she had been  doing fairly well, according to her; however, her history is again not  absolutely reliable.  She stated that she had been walking around doing  fairly well since her discharge.  [facility] unfortunately has declined  to  reaccept her at the assisted living due to this wandering behavior.  In any  event, she presents with no other companions to provide further history.  In  any event, history is provided by notes recorded by the ED nursing staff in  addition to EMS record that reported that she was walking in front of [employer] [address] and reportedly tripped on the curb.  She fell forward onto her  face, nose, and the knees.  She denied loss of consciousness; in fact, she  had abrasions on her outstretched hand indicating that she did try to catch  herself.  She was oriented at the time of their evaluation.  She had some  abrasions on her forehead, bridge of the nose, and had a laceration on her  upper lip.  She had abrasions on her knees bilaterally.  In the emergency  department, she was evaluated by Dr. Lynelle Doctor.  She had a cervical brace  placed.  She underwent CT scanning of her head as well as a cervical spine  film, plain x-rays of her hands.  There was no evidence of fractures with  the exception of a minimally displaced fracture of  the frontal process of  her left maxilla.  I discussed these findings with the on-call ENT, Dr.  Ezzard Standing, who felt that this was not requiring of surgical intervention and  only conservative management from here.  He did recommend dental followup,  which we will attempt to coordinate.   PAST MEDICAL HISTORY:  As below.  1. Tina Fitzgerald had suffered a cardiac arrest with a transient      anoxic injury with nonspecific cognitive deficit that has minimally      improved throughout her course.  She is alertable and orientable;      however, she does have a poor retention and exhibits some confabulation      perhaps to make up for the short term memory deficits.  Apparently she      has been wandering at the previous facility, which was not behavior      noted here during her prolonged course.  2. Recent prolonged hospital course with transient courses on a mechanical       ventilator due to an asystolic arrest presumed to be related to cardiac      tamponade.  She had septic pulmonary emboli initially and an empyema      for which she underwent chest tube drainage with remarkable resolution.  3. History of tricuspid valve endocarditis with a Methicillin-SENSITIVE      Staph aureus and an Enterococcus bacteremia as well as a fungemia.  She      had vegetation on her septal leaflet.  She underwent median sternotomy      and debridement for cardiothoracic surgery by Dr. Tressie Stalker on April 22, 2006.  Subsequently, she did undergo a series of cardiac      catheterizations and assessments by Dr. Gala Romney of [employer] [facility].  The surgery on April 22, 2006, preserved the anterior and      posterior leaflets of her tricuspid valve.  In addition, she underwent      closure of a patent foramen ovale during that operation.  On May 07, 2006, she required a subxiphoid window by Dr. Dorris Fetch for a large      pericardial effusion.  In addition, on May 19, 2006, she underwent      drainage of her pericardial effusion with 800 ml of fluid evacuated at      that time.  She underwent serial echocardiogram followups with the last      one revealing no evidence of a pericardial effusion on a two-D      echocardiogram dated June 13, 2006.  She was noted to have a      moderately dilated right ventricle, moderately reduced right      ventricular systolic function, and severe tricuspid regurgitation.  4. She did have a history of Enterococcal bacteremia as well as fungemia      under which she went treatment per recommendations of Infectious      Disease.  She is to undergo q.2 weeks blood cultures, her most recent      being on June 14, 2006, negative to date.  She is to continue these      at q.2 weeks x2 months for surveillance.  5. Anemia of chronic disease per previous hospitalization course, though     her last hemoglobin was 12.5 and her last  MCV was 93, and the last      platelet count was 350,  last WBC count was 6.2.  6. Resolved renal failure.  She did undergo a course of hemodialysis, but      her renal function has returned to normal.  7. She does have a history of hepatitis B and hepatitis C and had been      seen by Dr. Lindie Spruce during previous hospitalizations for concerns      regarding cholecystitis with elevated LFTs.  It was felt that there was      no need to pursue surgical intervention or a percutaneous drainage at      the time, though this could be required in the future if she were to      become symptomatic.   MEDICATIONS:  1. Depakote 250 mg p.o. t.i.d. to continue for one month and a trial off      when she is in her new place and setting for at least a month and then      Lexapro 10 mg daily.  2. Multivitamins daily.   ALLERGIES:  No known drug allergies.   SOCIAL HISTORY:  Tina Fitzgerald does have a prior history of IV drug  abuse; however, she has not had access to these since she has been in the  hospital for the past several months.  Apparently her mother is involved,  pager [phone].  Other numbers include [phone], ext. [phone], cell [phone].  Her mother's name is Lemont Fillers.   REVIEW OF SYSTEMS:  Tina Fitzgerald's review of systems is not reliable  as she does tend to confabulate.   PHYSICAL EXAMINATION:  VITAL SIGNS:  Temperature is 98.2, heart rate 79,  respirations 28, blood pressure 108/66.  GENERAL:  She is alert, pleasant, in no acute distress.  She does have  abrasions from the bridge of her nose to the tip of her nose down her upper  lip, and her hands and knees.  NECK:  Supple and nontender.  CARDIOVASCULAR:  Normal S1 and S2 with a left sternal border diastolic  murmur.  LUNGS:  Clear to auscultation bilaterally.  ABDOMEN:  Soft and nontender.  EXTREMITIES:  No edema.  Her peripheral pulses are symmetrical and palpable  bilaterally.  NEUROLOGIC:  She is alert and oriented.   She continues to maintain that  Redge Gainer is [employer], I am not certain.  We do reorient her, though she  does exhibit poor retention.   LABORATORY STUDIES:  She underwent imaging studies that were not revealing  of acute fractures.  She underwent CT of her head with findings only of a  minimally displaced fracture of the frontal process of the left maxilla as  described above.  I discussed this with Dr. Ovidio Kin.  Otherwise, no  other fractures were discovered.   ASSESSMENT AND PLAN:  1. Status post fall/tripped.  2. History of tricuspid valve endocarditis with a complicated course      including a pericardial tamponade and an effusion that had resolved at      time of discharge.  3. History of intravenous drug abuse.  4. History of hepatitis B and C.  5. Nonspecific cognitive impairments.  6. Multiple abrasions and lacerations.   Our plan at this time is we are going to admit Tina Fitzgerald. Unfortunately, she is not being accepted back at [facility].  Requesting  assistance of Case Management and a Child psychotherapist.  She may need a more  supervised environment.  We will place Steri-Strips on her and follow her  clinical course,  and then we will attempt to address her loose tooth with a  Dentistry consultation if this can be made available.  In any event, we will  follow her on the telemetry and follow her clinically for the possibility  that this was a syncopal event, though this certainly does not seem so as  described by available historians.      Hettie Holstein, D.O.  Electronically Signed     ESS/MEDQ  D:  06/15/2006  T:  06/15/2006  Job:  [id number]

## 2011-03-19 NOTE — Discharge Summary (Signed)
Tina Fitzgerald, Tina Fitzgerald NO.:  [id number]   MEDICAL RECORD NO.:  [id number]          PATIENT TYPE:  INP   LOCATION:  5024                         FACILITY:  [facility]   PHYSICIAN:  Hillery Aldo, M.D.   DATE OF BIRTH:  1971-02-17   DATE OF ADMISSION:  06/15/2006  DATE OF DISCHARGE:  07/01/2006                                 DISCHARGE SUMMARY   PRIMARY CARE PHYSICIAN:  The patient is unassigned.   DISCHARGE DIAGNOSES:  1. Status post fall suffering facial trauma.  2. Fracture of the #9 maxillary tooth, status post extraction.  3. 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  4. History of tricuspid valve endocarditis with a complicated course      including pericardial tamponade.  5. Status post median sternotomy and debridement on April 22, 2006.  6. Status post closure of a patent foramen ovale.  7. History of large pericardial effusion, status post subxiphoid window.  8. History of Enterococcal bacteremia and fungemia.  9. Anemia of chronic disease.  10.History of acute renal failure.  11.History of hepatitis B.  12.History of hepatitis C.  13.History of polysubstance abuse.  14.Ventilatory-dependent respiratory failure, secondary to asystolic      arrest related to history of cardiac tamponade.  15.History of septic pulmonary emboli and empyema.  16.Minimally-displaced left frontal process maxilla fracture.   DISCHARGE MEDICATIONS:  1. Depakote 250 mg t.i.d..  2. Lexapro 10 mg daily.  3. Senokot S, 1 tab q.h.s..  4. Nicotine patch 21 mg daily.  5. Protonix 40 mg daily.  6. Carafate 1 gm t.i.d.  7. Maalox suspension 30-60 mL q.4h. p.r.n.  8. Tylenol 650 mg q.4h. p.r.n.  9. Ultracet, 2 tabs q.4h. p.r.n.  10.Ibuprofen 800 mg q.8h. p.r.n.  11.Trazodone 50 mg q.h.s. p.r.n.  12.Multivitamin daily.   CONSULTATIONS:  1. Dr. Kristin Bruins of Dental Medicine.  2. Dr. Jeanie Sewer of Psychiatry.   BRIEF ADMISSION  HISTORY OF PRESENT ILLNESS:  The patient is a 40 year old  female with a past medical history of tricuspid valve endocarditis with  prolonged hospitalization and multiple medical complications during that  hospitalization, who was discharged to [facility].  The patient exhibited wandering behavior and frequently left the facility,  and during one of these excursions, she tripped and fell suffering facial  trauma, for which she was transported to the emergency department for  further evaluation and workup.  Because of her wandering behavior and safety  issues, [facility] would not accept her back to their facility.   PROCEDURES AND DIAGNOSTIC STUDIES:  1. CT scan of the head on June 15, 2006 showed minimally-displaced left      frontal process maxilla fracture.  No acute intracranial abnormalities.      No additional fractures.  2. Cervical spine four views on June 15, 2006 were negative.  3. Four views of the left knee on June 15, 2006 were negative.  4. Four views of the right knee on June 15, 2006 were negative.  5. Three views of  the right hand on June 15, 2006 were negative.  6. Orthopantogram on June 16, 2006 showed probable root canal and crown      of tooth #9.  No fracture identified.   DISCHARGE LABORATORY VALUES:  White blood cell count was 6.2, hemoglobin  11.6, hematocrit 34, platelets 192, sodium 139, potassium 4.3, chloride 108,  bicarb 26, BUN 27, creatinine 0.9, glucose 95.  The TSH was 1.368.  The B12  was 568, RBC folate 19.7, and RPR was nonreactive.   HOSPITAL COURSE BY PROBLEM:  1. Facial trauma, status post fall:  The patient's facial trauma was fully      evaluated with radiographs as noted above.  Dental Medicine was      consulted due to her maxillary fracture.  It was felt that she had a      probable root fracture and would need the tooth extracted.  This was      done, and the patient's postoperative course was  unremarkable.  Her      fall was not felt to be due to any kind of syncopal event.  Her      hospitalization was prolonged, secondary to placement issues.   1. Mood disorder, not otherwise specified:  The patient was seen in      consultation with Psychiatry, who recommended continuing her on her      usual doses of Depakote and Lexapro.  Trazodone was added for sleep      p.r.n..  It was felt that the patient would need long-term care in a      locked unit to maintain her safety due to her wandering behavior and      short-term memory deficits.  She was maintained with 24-hour sitter      after it became apparent that the patient's wandering behavior was      going to continue in the hospital.  She had no further psychiatric      issues.   1. Recent endocarditis:  Patient had a recent prolonged hospital course      with endocarditis.  Please see previous discharge summaries for a full      account of that hospitalization.  There were no medical complications      during this hospitalization.   DISPOSITION:  The patient was accepted to [facility] for  [place], [place].  The patient is stable for discharge and plans are  to transfer her today to that facility.      Hillery Aldo, M.D.  Electronically Signed     CR/MEDQ  D:  07/01/2006  T:  07/01/2006  Job:  [id number]

## 2011-06-24 ENCOUNTER — Encounter: Payer: Self-pay | Admitting: Cardiovascular Disease

## 2011-07-21 ENCOUNTER — Encounter: Payer: Self-pay | Admitting: Cardiovascular Disease

## 2011-10-13 ENCOUNTER — Encounter: Payer: Self-pay | Admitting: Cardiovascular Disease

## 2013-04-16 ENCOUNTER — Ambulatory Visit (INDEPENDENT_AMBULATORY_CARE_PROVIDER_SITE_OTHER): Payer: Medicare Other | Admitting: Cardiovascular Disease

## 2013-04-16 ENCOUNTER — Encounter: Payer: Self-pay | Admitting: Cardiovascular Disease

## 2013-04-16 VITALS — BP 119/87 | HR 66 | Ht 65.5 in | Wt 168.8 lb

## 2013-04-16 DIAGNOSIS — E119 Type 2 diabetes mellitus without complications: Secondary | ICD-10-CM

## 2013-04-16 DIAGNOSIS — Z8679 Personal history of other diseases of the circulatory system: Secondary | ICD-10-CM

## 2013-04-16 DIAGNOSIS — Z720 Tobacco use: Secondary | ICD-10-CM

## 2013-04-16 DIAGNOSIS — R42 Dizziness and giddiness: Secondary | ICD-10-CM

## 2013-04-16 DIAGNOSIS — I369 Nonrheumatic tricuspid valve disorder, unspecified: Secondary | ICD-10-CM

## 2013-04-16 DIAGNOSIS — F17200 Nicotine dependence, unspecified, uncomplicated: Secondary | ICD-10-CM

## 2013-04-16 DIAGNOSIS — I079 Rheumatic tricuspid valve disease, unspecified: Secondary | ICD-10-CM

## 2013-04-16 DIAGNOSIS — I2510 Atherosclerotic heart disease of native coronary artery without angina pectoris: Secondary | ICD-10-CM

## 2013-04-16 HISTORY — DX: Personal history of other diseases of the circulatory system: Z86.79

## 2013-04-16 NOTE — Progress Notes (Signed)
History of Present Illness: 42 yo female with history of TV endocarditis, former polysubstance abuse, Hepatitis B and C, PFO s/p closure here today to re-establish cardiology care. She was last seen in November 2010 by Dr. Raynelle Bring in the [facility] office. 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 Seen in 2010 in our [place] office for right sided chest pain which was not felt to be cardiac.   She tells me today that she has had dizziness and SOB. She has fatigue and flu-like symptoms in the am. She is a very anxious person. She smokes 1 ppd. No chest pain. She has been using a friends supplemental O2 at times because it relaxes her and helps her sleep.   Primary Care Physician: Dr. Anner Crete [facility])  Past Medical History  Diagnosis Date  . Endocarditis     (TV) with complicated course  . PFO (patent foramen ovale)     s/p closure  . Hepatitis B carrier   . Hepatitis C   . Tobacco abuse     Past Surgical History  Procedure Laterality Date  . Tricuspid valve surgery      Endocarditis debridement  . Patent foramen ovale closure      Current Outpatient Prescriptions  Medication Sig Dispense Refill  . famotidine (PEPCID) 20 MG tablet Take 20 mg by mouth daily.      Marland Kitchen PROAIR HFA 108 (90 BASE) MCG/ACT inhaler       . sertraline (ZOLOFT) 50 MG tablet Take 50 mg by mouth daily.        No current facility-administered medications for this visit.    No Known Allergies  History   Social History  . Marital Status: Divorced    Spouse Name: N/A    Number of Children: 6  . Years of Education: N/A   Occupational History  . disabled    Social History Main Topics  . Smoking status: Current Every Day Smoker -- 1.00 packs/day for 30 years    Types: Cigarettes  . Smokeless tobacco: Not on file  . Alcohol Use: No  . Drug Use: Yes   Comment: last used in 2007  . Sexually Active: Not on file   Other Topics Concern  . Not on file   Social History Narrative  . No narrative on file    Family History  Problem Relation Age of Onset  . Heart disease Neg Hx     Review of Systems:  As stated in the HPI and otherwise negative.   BP 119/87  Pulse 66  Ht 5' 5.5" (1.664 m)  Wt 168 lb 12.8 oz (76.567 kg)  BMI 27.65 kg/m2  Physical Examination: General: Well developed, well nourished, NAD HEENT: OP clear, mucus membranes moist SKIN: warm, dry. No rashes. Neuro: No focal deficits Musculoskeletal: Muscle strength 5/5 all ext Psychiatric: Mood and affect normal Neck: No JVD, no carotid bruits, no thyromegaly, no lymphadenopathy. Lungs:Clear bilaterally, no wheezes, rhonci, crackles Cardiovascular: Regular rate and rhythm. No murmurs, gallops or rubs. Abdomen:Soft. Bowel sounds present. Non-tender.  Extremities: No lower extremity edema. Pulses are 2 + in the bilateral DP/PT.  EKG: NSR, rate 68 bpm.   Assessment and Plan:   1. History of TV endocarditis: She had tricuspid valve debridement in 2007 secondary to endocarditis in setting of IV drug abuse. She had not used IV drugs since then. Overall  feeling poorly lately with c/o SOB and fatigue. EKG is normal today. BP is in a normal range. Will arrange echocardiogram to assess her valves and assess LVEF. I do not think she needs an ischemic workup at this time. No chest pain is reported.

## 2013-04-16 NOTE — Patient Instructions (Addendum)
Your physician recommends that you schedule a follow-up appointment in: about 6 weeks.   Your physician has requested that you have an echocardiogram. Echocardiography is a painless test that uses sound waves to create images of your heart. It provides your doctor with information about the size and shape of your heart and how well your heart's chambers and valves are working. This procedure takes approximately one hour. There are no restrictions for this procedure.

## 2013-04-23 ENCOUNTER — Ambulatory Visit (HOSPITAL_COMMUNITY): Payer: Medicare Other | Attending: Cardiovascular Disease | Admitting: Radiology

## 2013-04-23 DIAGNOSIS — B1920 Unspecified viral hepatitis C without hepatic coma: Secondary | ICD-10-CM | POA: Insufficient documentation

## 2013-04-23 DIAGNOSIS — F17200 Nicotine dependence, unspecified, uncomplicated: Secondary | ICD-10-CM | POA: Insufficient documentation

## 2013-04-23 DIAGNOSIS — I369 Nonrheumatic tricuspid valve disorder, unspecified: Secondary | ICD-10-CM

## 2013-04-23 DIAGNOSIS — B1910 Unspecified viral hepatitis B without hepatic coma: Secondary | ICD-10-CM | POA: Insufficient documentation

## 2013-04-23 DIAGNOSIS — I079 Rheumatic tricuspid valve disease, unspecified: Secondary | ICD-10-CM

## 2013-04-23 NOTE — Progress Notes (Signed)
Echocardiogram performed.  

## 2013-04-26 ENCOUNTER — Telehealth: Payer: Self-pay | Admitting: Cardiovascular Disease

## 2013-04-26 NOTE — Telephone Encounter (Signed)
Spoke with pt and her husband. They would like to move appt scheduled for May 14, 2013 to an earlier date if possible. Appt moved to May 02, 2013 at 10:00

## 2013-04-26 NOTE — Telephone Encounter (Signed)
Left message to call back  

## 2013-04-26 NOTE — Telephone Encounter (Signed)
New problem ° ° °Pt returning your call. °

## 2013-05-01 ENCOUNTER — Encounter: Payer: Self-pay | Admitting: *Deleted

## 2013-05-02 ENCOUNTER — Encounter: Payer: Self-pay | Admitting: *Deleted

## 2013-05-02 ENCOUNTER — Ambulatory Visit (INDEPENDENT_AMBULATORY_CARE_PROVIDER_SITE_OTHER): Payer: Medicare Other | Admitting: Cardiovascular Disease

## 2013-05-02 ENCOUNTER — Encounter (HOSPITAL_COMMUNITY): Payer: Self-pay | Admitting: Pharmacy Technician

## 2013-05-02 ENCOUNTER — Encounter: Payer: Self-pay | Admitting: Cardiovascular Disease

## 2013-05-02 VITALS — BP 107/77 | HR 69 | Resp 12 | Ht 65.5 in | Wt 168.0 lb

## 2013-05-02 DIAGNOSIS — I071 Rheumatic tricuspid insufficiency: Secondary | ICD-10-CM

## 2013-05-02 DIAGNOSIS — I079 Rheumatic tricuspid valve disease, unspecified: Secondary | ICD-10-CM

## 2013-05-02 LAB — CBC WITH DIFFERENTIAL/PLATELET
Basophils Absolute: 0.1 10*3/uL (ref 0.0–0.1)
Eosinophils Relative: 2.1 % (ref 0.0–5.0)
HCT: 46.3 % — ABNORMAL HIGH (ref 36.0–46.0)
Hemoglobin: 15.7 g/dL — ABNORMAL HIGH (ref 12.0–15.0)
Lymphocytes Relative: 27.3 % (ref 12.0–46.0)
Lymphs Abs: 2.6 10*3/uL (ref 0.7–4.0)
Monocytes Relative: 5.1 % (ref 3.0–12.0)
Neutro Abs: 6.3 10*3/uL (ref 1.4–7.7)
RBC: 4.94 Mil/uL (ref 3.87–5.11)
WBC: 9.7 10*3/uL (ref 4.5–10.5)

## 2013-05-02 LAB — BASIC METABOLIC PANEL
BUN: 10 mg/dL (ref 6–23)
Chloride: 108 mEq/L (ref 96–112)
GFR: 84.72 mL/min (ref >60.00)
Glucose, Bld: 96 mg/dL (ref 70–99)
Potassium: 4.2 mEq/L (ref 3.5–5.1)
Sodium: 137 mEq/L (ref 135–145)

## 2013-05-02 NOTE — Patient Instructions (Addendum)
Your physician has requested that you have a cardiac catheterization. Cardiac catheterization is used to diagnose and/or treat various heart conditions. Doctors may recommend this procedure for a number of different reasons. The most common reason is to evaluate chest pain. Chest pain can be a symptom of coronary artery disease (CAD), and cardiac catheterization can show whether plaque is narrowing or blocking your heart's arteries. This procedure is also used to evaluate the valves, as well as measure the blood flow and oxygen levels in different parts of your heart. For further information please visit [email or web address] Please follow instruction sheet, as given.   Your physician has requested that you have a TEE. During a TEE, sound waves are used to create images of your heart. It provides your doctor with information about the size and shape of your heart and how well your heart's chambers and valves are working. In this test, a transducer is attached to the end of a flexible tube that's guided down your throat and into your esophagus (the tube leading from you mouth to your stomach) to get a more detailed image of your heart. You are not awake for the procedure. Please see the instruction sheet given to you today. For further information please visit [email or web address]  Coronary Angiography Coronary angiography is an X-ray procedure used to look at the arteries in the heart. In this procedure, a dye is injected through a long, hollow tube (catheter). The catheter is about the size of a piece of cooked spaghetti. The catheter injects a dye into an artery in your groin. X-rays are then taken to show if there is a blockage in the arteries of your heart. BEFORE THE PROCEDURE   Let your caregiver know if you have allergies to shellfish or contrast dye. Also let your caregiver know if you have kidney problems or failure.  Do not eat or drink starting from midnight up to the time of the procedure, or  as directed.  You may drink enough water to take your medications the morning of the procedure if you were instructed to do so.  You should be at the hospital or outpatient facility where the procedure is to be done 60 minutes prior to the procedure or as directed. PROCEDURE  You may be given an IV medication to help you relax before the procedure.  You will be prepared for the procedure by washing and shaving the area where the catheter will be inserted. This is usually done in the groin but may be done in the fold of your arm by your elbow.  A medicine will be given to numb your groin where the catheter will be inserted.  A specially trained doctor will insert the catheter into an artery in your groin. The catheter is guided by using a special type of X-ray (fluoroscopy) to the blood vessel being examined.  A special dye is then injected into the catheter and X-rays are taken. The dye helps to show where any narrowing or blockages are located in the heart arteries. AFTER THE PROCEDURE   After the procedure you will be kept in bed lying flat for several hours. You will be instructed to not bend or cross your legs.  The groin insertion site will be watched and checked frequently.  The pulse in your feet will be checked frequently.  Additional blood tests, X-rays and an EKG may be done.  You may stay in the hospital overnight for observation. SEEK IMMEDIATE MEDICAL CARE IF:   You develop chest  pain, shortness of breath, feel faint, or pass out.  There is bleeding, swelling, or drainage from the catheter insertion site.  You develop pain, discoloration, coldness, or severe bruising in the leg or area where the catheter was inserted.  You have a fever. Document Released: 04/24/2003 Document Revised: 01/10/2012 Document Reviewed: 06/12/2008 [facility] Patient Information 2014 [place], [place].   Transesophageal Echocardiography A transesophageal echocardiogram (TEE) is a special  type of test that produces images of the heart by sound waves (echocardiogram). This type of echocardiogram can obtain better images of the heart than a standard echocardiogram. A TEE is done by passing a flexible tube down the esophagus. The heart is located in front of the esophagus. Because the heart and esophagus are close to one another, your caregiver can take very clear, detailed pictures of the heart via ultrasound waves. WHY HAVE A TEE? Your caregiver may need more information based on your medical condition. A TEE is usually performed due to the following:  Your caregiver needs more information based on standard echocardiogram findings.  If you had a stroke, this might have happened because a clot formed in your heart. A TEE can visualize different areas of the heart and check for clots.  To check valve anatomy and function. Your caregiver will especially look at the mitral valve.  To check for redness, soreness, and swelling (inflammation) on the inside lining of the heart (endocarditis).  To evaluate the dividing wall (septum) of the heart and presence of a hole that did not close after birth (patent foramen ovale, PFO).  To help diagnose a tear in the wall of the aorta (aortic dissection).  During cardiac valve surgery, a TEE probe is placed. This allows the surgeon to assess the valve repair before closing the chest. LET YOUR CAREGIVER KNOW ABOUT:   Swallowing difficulties.  An esophageal obstruction.  Use of aspirin or antiplatelet therapy. RISKS AND COMPLICATIONS  Though extremely rare, an esophageal tear (rupture) is a potential complication. BEFORE THE PROCEDURE   Arrive at least 1 hour before the procedure or as told by your caregiver.  Do not eat or drink for 6 hours before the procedure or as told by your caregiver.  An intravenous (IV) access tube will be started in the arm. PROCEDURE   A medicine to help you relax (sedative) will be given through the IV.  A  medicine that numbs the area (local anesthetic) may be sprayed to the back of the throat.  Your blood pressure, heart rate, and breathing (vital signs) will be monitored during the procedure.  The TEE probe is a long, flexible tube. It is about the width of an adult female's index finger. The tip of the probe is placed into the back of the mouth and you will be asked to swallow. This helps to pass the tip of the probe into the esophagus. Once the tip of the probe is in the correct area, your caregiver can take pictures of the heart.  A TEE is usually not a painful procedure. You may feel the probe press against the back of the throat. The probe does not enter the trachea and does not affect your breathing.  Your time spent at the hospital is usually less than 2 hours. AFTER THE PROCEDURE   You will be in bed, resting until you have fully returned to consciousness.  When you first awaken, your throat may feel slightly sore and will probably still feel numb. This will improve slowly over time.  You will not be allowed to eat or drink until it is clear that numbness has improved.  Once you have been able to drink, urinate, and sit on the edge of the bed without feeling sick to your stomach (nauseous) or dizzy, you may be cleared to dress and go home.  Do not drive yourself home. You have had medications that can continue to make you feel drowsy and can impair your reflexes.  You should have a friend or family member with you for the next 24 hours after your examination. Obtaining the test results It is your responsibility to obtain your test results. Ask the lab or department performing the test when and how you will get your results. SEEK IMMEDIATE MEDICAL CARE IF:   There is chest pain.  You have a hard time breathing or have shortness of breath.  You cough or throw up (vomit) blood. MAKE SURE YOU:   Understand these instructions.  Will watch this condition.  Will get help right  away if you is not doing well or gets worse. Document Released: 01/08/2003 Document Revised: 01/10/2012 Document Reviewed: 04/01/2009 [facility] Patient Information 2014 [place], [place].

## 2013-05-02 NOTE — Progress Notes (Signed)
 History of Present Illness: 42 yo female with history of TV endocarditis, former polysubstance abuse, Hepatitis B and C, PFO s/p closure here today for cardiac follow up. I saw her 04/16/13 to re-establish cardiology care. She had been followed in the past in our [place] office by Dr. Allen. 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 At the visit in our office 04/16/13, she c/o dizziness, SOB, fatigue, flu-like symptoms.  She smokes 1 ppd. No chest pain. She has been using a friends supplemental O2 at times because it relaxes her and helps her sleep. I arranged an echo 04/23/13 which showed LVEF=45%, dilated RV and RA, severe TR.   She is here today for follow up. She continues to c/o SOB, dizziness, fatigue and headaches. No syncope. She becomes profoundly dyspneic with exertion.   Primary Care Physician: Dr. Wells ([facility])  Past Medical History  Diagnosis Date  . Endocarditis     (TV) with complicated course  . PFO (patent foramen ovale)     s/p closure  . Hepatitis B carrier   . Hepatitis C   . Tobacco abuse     Past Surgical History  Procedure Laterality Date  . Tricuspid valve surgery      Endocarditis debridement  . Patent foramen ovale closure      Current Outpatient Prescriptions  Medication Sig Dispense Refill  . famotidine (PEPCID) 20 MG tablet Take 20 mg by mouth daily.      . PROAIR HFA 108 (90 BASE) MCG/ACT inhaler       . sertraline (ZOLOFT) 50 MG tablet Take 50 mg by mouth daily.        No current facility-administered medications for this visit.    No Known Allergies  History   Social History  . Marital Status: Divorced    Spouse Name: N/A    Number of Children: 6  . Years of Education: N/A   Occupational History  . disabled    Social History Main Topics  . Smoking status: Current Every Day Smoker -- 1.00  packs/day for 30 years    Types: Cigarettes  . Smokeless tobacco: Not on file  . Alcohol Use: No  . Drug Use: Yes     Comment: last used in 2007  . Sexually Active: Not on file   Other Topics Concern  . Not on file   Social History Narrative  . No narrative on file    Family History  Problem Relation Age of Onset  . Heart disease Neg Hx     Review of Systems:  As stated in the HPI and otherwise negative.   BP 107/77  Pulse 69  Resp 12  Wt 168 lb (76.204 kg)  BMI 27.52 kg/m2  Physical Examination: General: Well developed, well nourished, NAD HEENT: OP clear, mucus membranes moist SKIN: warm, dry. No rashes. Neuro: No focal deficits Musculoskeletal: Muscle strength 5/5 all ext Psychiatric: Mood and affect normal Neck: No JVD, no carotid bruits, no thyromegaly, no lymphadenopathy. Lungs:Clear bilaterally, no wheezes, rhonci, crackles Cardiovascular: Regular rate and rhythm with systolic murmur. No gallops or rubs. Abdomen:Soft. Bowel sounds present. Non-tender.  Extremities: No lower extremity edema. Pulses are 2 + in the bilateral DP/PT.  Echo 04/23/13: Left ventricle: The cavity size was normal. Wall thickness was normal. The estimated ejection fraction was 45%. Diffuse hypokinesis. - Right ventricle: The cavity size   was severely dilated. - Right atrium: The atrium was moderately dilated. - Atrial septum: No defect or patent foramen ovale was identified. - Tricuspid valve: Etiology of the severe TR is not apparent Severe regurgitation.  Assessment and Plan:   1. 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 She is s/p TV debridement in 2007 in setting of endocarditis. No follow up since then. She will need TEE and Right/Left heart cath. We will have surgical follow up arranged after the TEE and cath. Will schedule procedures on 05/08/13. Risks and benefits are reviewed. She agrees to proceed. Will arrange TEE then cath later that day.  

## 2013-05-08 ENCOUNTER — Observation Stay (HOSPITAL_COMMUNITY)
Admission: RE | Admit: 2013-05-08 | Discharge: 2013-05-09 | Disposition: A | Discharge status: Home / Self Care | Payer: Medicare Other | Source: Ambulatory Visit | Attending: Cardiology | Admitting: Cardiology

## 2013-05-08 ENCOUNTER — Other Ambulatory Visit: Payer: Self-pay | Admitting: *Deleted

## 2013-05-08 ENCOUNTER — Encounter (HOSPITAL_COMMUNITY)
Admission: RE | Disposition: A | Discharge status: Home / Self Care | Payer: Self-pay | Source: Ambulatory Visit | Attending: Cardiology

## 2013-05-08 ENCOUNTER — Encounter (HOSPITAL_COMMUNITY): Payer: Self-pay

## 2013-05-08 DIAGNOSIS — I079 Rheumatic tricuspid valve disease, unspecified: Principal | ICD-10-CM | POA: Insufficient documentation

## 2013-05-08 DIAGNOSIS — F3289 Other specified depressive episodes: Secondary | ICD-10-CM | POA: Insufficient documentation

## 2013-05-08 DIAGNOSIS — K219 Gastro-esophageal reflux disease without esophagitis: Secondary | ICD-10-CM | POA: Insufficient documentation

## 2013-05-08 DIAGNOSIS — J45909 Unspecified asthma, uncomplicated: Secondary | ICD-10-CM | POA: Insufficient documentation

## 2013-05-08 DIAGNOSIS — F17200 Nicotine dependence, unspecified, uncomplicated: Secondary | ICD-10-CM | POA: Insufficient documentation

## 2013-05-08 DIAGNOSIS — M129 Arthropathy, unspecified: Secondary | ICD-10-CM | POA: Insufficient documentation

## 2013-05-08 DIAGNOSIS — B1920 Unspecified viral hepatitis C without hepatic coma: Secondary | ICD-10-CM | POA: Insufficient documentation

## 2013-05-08 DIAGNOSIS — F329 Major depressive disorder, single episode, unspecified: Secondary | ICD-10-CM | POA: Insufficient documentation

## 2013-05-08 DIAGNOSIS — B1910 Unspecified viral hepatitis B without hepatic coma: Secondary | ICD-10-CM | POA: Insufficient documentation

## 2013-05-08 DIAGNOSIS — R413 Other amnesia: Secondary | ICD-10-CM | POA: Insufficient documentation

## 2013-05-08 DIAGNOSIS — Y840 Cardiac catheterization as the cause of abnormal reaction of the patient, or of later complication, without mention of misadventure at the time of the procedure: Secondary | ICD-10-CM | POA: Insufficient documentation

## 2013-05-08 DIAGNOSIS — F411 Generalized anxiety disorder: Secondary | ICD-10-CM | POA: Insufficient documentation

## 2013-05-08 DIAGNOSIS — Z79899 Other long term (current) drug therapy: Secondary | ICD-10-CM | POA: Insufficient documentation

## 2013-05-08 DIAGNOSIS — I369 Nonrheumatic tricuspid valve disorder, unspecified: Secondary | ICD-10-CM

## 2013-05-08 DIAGNOSIS — I071 Rheumatic tricuspid insufficiency: Secondary | ICD-10-CM

## 2013-05-08 DIAGNOSIS — IMO0002 Reserved for concepts with insufficient information to code with codable children: Secondary | ICD-10-CM | POA: Insufficient documentation

## 2013-05-08 DIAGNOSIS — R0602 Shortness of breath: Secondary | ICD-10-CM

## 2013-05-08 HISTORY — DX: Depression, unspecified: F32.A

## 2013-05-08 HISTORY — DX: Anxiety disorder, unspecified: F41.9

## 2013-05-08 HISTORY — DX: Unspecified osteoarthritis, unspecified site: M19.90

## 2013-05-08 HISTORY — DX: Other amnesia: R41.3

## 2013-05-08 HISTORY — PX: LEFT AND RIGHT HEART CATHETERIZATION WITH CORONARY ANGIOGRAM: SHX5449

## 2013-05-08 HISTORY — DX: Rheumatic tricuspid insufficiency: I07.1

## 2013-05-08 HISTORY — DX: Major depressive disorder, single episode, unspecified: F32.9

## 2013-05-08 HISTORY — DX: Septic pulmonary embolism without acute cor pulmonale: I26.90

## 2013-05-08 HISTORY — PX: TEE WITHOUT CARDIOVERSION: SHX5443

## 2013-05-08 HISTORY — DX: Cardiac arrest, cause unspecified: I46.9

## 2013-05-08 HISTORY — DX: Gastro-esophageal reflux disease without esophagitis: K21.9

## 2013-05-08 HISTORY — DX: Cardiac tamponade: I31.4

## 2013-05-08 HISTORY — DX: Acute kidney failure, unspecified: N17.9

## 2013-05-08 HISTORY — DX: Unspecified asthma, uncomplicated: J45.909

## 2013-05-08 LAB — POCT I-STAT 3, ART BLOOD GAS (G3+)
Acid-base deficit: 4 mmol/L — ABNORMAL HIGH (ref 0.0–2.0)
Bicarbonate: 21.1 mEq/L (ref 20.0–24.0)
O2 Saturation: 96 %
TCO2: 22 mmol/L (ref 0–100)
pCO2 arterial: 39.8 mmHg (ref 35.0–45.0)
pO2, Arterial: 85 mmHg (ref 80.0–100.0)

## 2013-05-08 LAB — PREGNANCY, URINE: Preg Test, Ur: NEGATIVE

## 2013-05-08 LAB — POCT I-STAT 3, VENOUS BLOOD GAS (G3P V)
Acid-base deficit: 4 mmol/L — ABNORMAL HIGH (ref 0.0–2.0)
O2 Saturation: 66 %
TCO2: 24 mmol/L (ref 0–100)

## 2013-05-08 SURGERY — ECHOCARDIOGRAM, TRANSESOPHAGEAL
Anesthesia: Moderate Sedation

## 2013-05-08 SURGERY — LEFT AND RIGHT HEART CATHETERIZATION WITH CORONARY ANGIOGRAM
Anesthesia: LOCAL

## 2013-05-08 MED ORDER — FAMOTIDINE 20 MG PO TABS
20.0000 mg | ORAL_TABLET | Freq: Every day | ORAL | Status: DC
  Administered 2013-05-08: 21:00:00 20 mg via ORAL
  Filled 2013-05-08 (×2): qty 1

## 2013-05-08 MED ORDER — SODIUM CHLORIDE 0.9 % IV SOLN: INTRAVENOUS | Status: DC

## 2013-05-08 MED ORDER — SODIUM CHLORIDE 0.9 % IJ SOLN: 3.0000 mL | Freq: Two times a day (BID) | INTRAMUSCULAR | Status: DC

## 2013-05-08 MED ORDER — SODIUM CHLORIDE 0.9 % IV SOLN
250.0000 mL | INTRAVENOUS | Status: DC | PRN
Start: 2013-05-08 — End: 2013-05-08

## 2013-05-08 MED ORDER — FENTANYL CITRATE 0.05 MG/ML IJ SOLN
INTRAMUSCULAR | Status: DC | PRN
  Administered 2013-05-08: 25 ug via INTRAVENOUS

## 2013-05-08 MED ORDER — HYDROXYZINE HCL 25 MG PO TABS
25.0000 mg | ORAL_TABLET | Freq: Every evening | ORAL | Status: DC | PRN
  Filled 2013-05-08: qty 1

## 2013-05-08 MED ORDER — DIPHENHYDRAMINE HCL 50 MG/ML IJ SOLN
INTRAMUSCULAR | Status: AC
  Filled 2013-05-08: qty 1

## 2013-05-08 MED ORDER — SODIUM CHLORIDE 0.9 % IJ SOLN: 3.0000 mL | INTRAMUSCULAR | Status: DC | PRN

## 2013-05-08 MED ORDER — FENTANYL CITRATE 0.05 MG/ML IJ SOLN
INTRAMUSCULAR | Status: AC
  Filled 2013-05-08: qty 2

## 2013-05-08 MED ORDER — HEPARIN (PORCINE) IN NACL 2-0.9 UNIT/ML-% IJ SOLN
INTRAMUSCULAR | Status: AC
  Filled 2013-05-08: qty 1000

## 2013-05-08 MED ORDER — ASPIRIN 81 MG PO CHEW: 324.0000 mg | CHEWABLE_TABLET | ORAL | Status: DC

## 2013-05-08 MED ORDER — SODIUM CHLORIDE 0.9 % IV SOLN: 250.0000 mL | INTRAVENOUS | Status: DC | PRN

## 2013-05-08 MED ORDER — MIDAZOLAM HCL 10 MG/2ML IJ SOLN
INTRAMUSCULAR | Status: DC | PRN
  Administered 2013-05-08 (×2): 2 mg via INTRAVENOUS

## 2013-05-08 MED ORDER — ALBUTEROL SULFATE HFA 108 (90 BASE) MCG/ACT IN AERS: 2.0000 | INHALATION_SPRAY | Freq: Four times a day (QID) | RESPIRATORY_TRACT | Status: DC | PRN

## 2013-05-08 MED ORDER — SODIUM CHLORIDE 0.9 % IV SOLN
INTRAVENOUS | Status: DC
  Administered 2013-05-08: 09:00:00 via INTRAVENOUS

## 2013-05-08 MED ORDER — LIDOCAINE HCL (PF) 1 % IJ SOLN
INTRAMUSCULAR | Status: AC
  Filled 2013-05-08: qty 30

## 2013-05-08 MED ORDER — DIAZEPAM 5 MG PO TABS: 5.0000 mg | ORAL_TABLET | ORAL | Status: DC

## 2013-05-08 MED ORDER — NICOTINE 21 MG/24HR TD PT24
21.0000 mg | MEDICATED_PATCH | TRANSDERMAL | Status: DC
  Administered 2013-05-08: 21 mg via TRANSDERMAL
  Filled 2013-05-08 (×2): qty 1

## 2013-05-08 MED ORDER — ACETAMINOPHEN 325 MG PO TABS: 650.0000 mg | ORAL_TABLET | ORAL | Status: DC | PRN

## 2013-05-08 MED ORDER — OXYCODONE-ACETAMINOPHEN 5-325 MG PO TABS
2.0000 | ORAL_TABLET | Freq: Once | ORAL | Status: AC
  Administered 2013-05-08: 2 via ORAL

## 2013-05-08 MED ORDER — OXYCODONE-ACETAMINOPHEN 5-325 MG PO TABS
ORAL_TABLET | ORAL | Status: AC
  Filled 2013-05-08: qty 2

## 2013-05-08 MED ORDER — IBUPROFEN 200 MG PO TABS
200.0000 mg | ORAL_TABLET | Freq: Four times a day (QID) | ORAL | Status: DC | PRN
  Filled 2013-05-08: qty 1

## 2013-05-08 MED ORDER — MIDAZOLAM HCL 2 MG/2ML IJ SOLN
INTRAMUSCULAR | Status: AC
  Filled 2013-05-08: qty 2

## 2013-05-08 MED ORDER — MIDAZOLAM HCL 5 MG/ML IJ SOLN
INTRAMUSCULAR | Status: AC
  Filled 2013-05-08: qty 2

## 2013-05-08 MED ORDER — ONDANSETRON HCL 4 MG/2ML IJ SOLN: 4.0000 mg | Freq: Four times a day (QID) | INTRAMUSCULAR | Status: DC | PRN

## 2013-05-08 MED ORDER — BUTAMBEN-TETRACAINE-BENZOCAINE 2-2-14 % EX AERO
INHALATION_SPRAY | CUTANEOUS | Status: DC | PRN
  Administered 2013-05-08: 2 via TOPICAL

## 2013-05-08 MED ORDER — SERTRALINE HCL 50 MG PO TABS
50.0000 mg | ORAL_TABLET | Freq: Every day | ORAL | Status: DC
  Filled 2013-05-08: qty 1

## 2013-05-08 NOTE — Interval H&P Note (Signed)
History and Physical Interval Note:  05/08/2013 10:08 AM  Tina Fitzgerald  has presented today for surgery, with the diagnosis of TRICUSPID VALVE REGURGITATION  The various methods of treatment have been discussed with the patient and family. After consideration of risks, benefits and other options for treatment, the patient has consented to  Procedure(s): TRANSESOPHAGEAL ECHOCARDIOGRAM (TEE) (N/A) as a surgical intervention .  The patient's history has been reviewed, patient examined, no change in status, stable for surgery.  I have reviewed the patient's chart and labs.  Questions were answered to the patient's satisfaction.     Olga Millers

## 2013-05-08 NOTE — Progress Notes (Signed)
CLIENT GOT UP TO BATHROOM AND C/O PAIN RIGHT GROIN AND HEMATOMA 4X4CM NOTED AND PRESSURE HELD FOR AND SOFTER AND DR MCALHANY IN TO SEE AND TO STAY 23HR OBS

## 2013-05-08 NOTE — H&P (Signed)
Tina Fitzgerald  05/02/2013 10:00 AM   Office Visit  MRN:  [id number]   Description: 42 year old female  Provider: Kathleene Hazel, MD  Department: [facility]         Diagnoses    Tricuspid valve regurgitation    -  Primary    397.0          Progress Notes    Kathleene Hazel, MD at 05/02/2013 11:04 AM    Status: Signed                      History of Present Illness: 42 yo female with history of TV endocarditis, former polysubstance abuse, Hepatitis B and C, PFO s/p closure here today for cardiac follow up. I saw her 04/16/13 to re-establish cardiology care. She had been followed in the past in our [place] office by Dr. Freida Busman. 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 At the visit in our office 04/16/13, she c/o dizziness, SOB, fatigue, flu-like symptoms.  She smokes 1 ppd. No chest pain. She has been using a friends supplemental O2 at times because it relaxes her and helps her sleep. I arranged an echo 04/23/13 which showed LVEF=45%, dilated RV and RA, severe TR.    She is here today for follow up. She continues to c/o SOB, dizziness, fatigue and headaches. No syncope. She becomes profoundly dyspneic with exertion.    Primary Care Physician: Dr. Anner Crete [facility])    Past Medical History   Diagnosis  Date   .  Endocarditis         (TV) with complicated course   .  PFO (patent foramen ovale)         s/p closure   .  Hepatitis B carrier     .  Hepatitis C     .  Tobacco abuse           Past Surgical History   Procedure  Laterality  Date   .  Tricuspid valve surgery           Endocarditis debridement   .  Patent foramen ovale closure             Current Outpatient Prescriptions   Medication  Sig  Dispense  Refill   .  famotidine (PEPCID) 20 MG tablet  Take 20 mg by mouth daily.         Marland Kitchen  PROAIR HFA  108 (90 BASE) MCG/ACT inhaler           .  sertraline (ZOLOFT) 50 MG tablet  Take 50 mg by mouth daily.              No current facility-administered medications for this visit.        No Known Allergies    History       Social History   .  Marital Status:  Divorced       Spouse Name:  N/A       Number of Children:  6   .  Years of Education:  N/A       Occupational History   .  disabled         Social History Main Topics   .  Smoking status:  Current Every Day Smoker -- 1.00 packs/day for 30 years  Types:  Cigarettes   .  Smokeless tobacco:  Not on file   .  Alcohol Use:  No   .  Drug Use:  Yes         Comment: last used in 2007   .  Sexually Active:  Not on file       Other Topics  Concern   .  Not on file       Social History Narrative   .  No narrative on file         Family History   Problem  Relation  Age of Onset   .  Heart disease  Neg Hx          Review of Systems:  As stated in the HPI and otherwise negative.    BP 107/77  Pulse 69  Resp 12  Wt 168 lb (76.204 kg)  BMI 27.52 kg/m2   Physical Examination: General: Well developed, well nourished, NAD  HEENT: OP clear, mucus membranes moist  SKIN: warm, dry. No rashes. Neuro: No focal deficits  Musculoskeletal: Muscle strength 5/5 all ext  Psychiatric: Mood and affect normal  Neck: No JVD, no carotid bruits, no thyromegaly, no lymphadenopathy.  Lungs:Clear bilaterally, no wheezes, rhonci, crackles Cardiovascular: Regular rate and rhythm with systolic murmur. No gallops or rubs. Abdomen:Soft. Bowel sounds present. Non-tender.   Extremities: No lower extremity edema. Pulses are 2 + in the bilateral DP/PT.   Echo 04/23/13: Left ventricle: The cavity size was normal. Wall thickness was normal. The estimated ejection fraction was 45%. Diffuse hypokinesis. - Right ventricle: The cavity size was severely dilated. - Right atrium: The atrium was moderately dilated. - Atrial septum:  No defect or patent foramen ovale was identified. - Tricuspid valve: Etiology of the severe TR is not apparent Severe regurgitation.   Assessment and Plan:    1. 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 She is s/p TV debridement in 2007 in setting of endocarditis. No follow up since then. She will need TEE and Right/Left heart cath. We will have surgical follow up arranged after the TEE and cath. Will schedule procedures on 05/08/13. Risks and benefits are reviewed. She agrees to proceed. Will arrange TEE then cath later that day.      For TEE; no changes. Olga Millers

## 2013-05-08 NOTE — Progress Notes (Signed)
Pt is s/p diagnostic right and left heart cath today with access in the right femoral artery and vein. After bedrest, she went to the restroom and was found to have a hematoma of the right groin. Now stable after pressure. She is lying in bed. Hemodynamically stable. No abdominal pain. Groin soft to touch. Will admit overnight for bedrest and plan d/c in am if stable.   Gianelle Mccaul 4:36 PM 05/08/2013

## 2013-05-08 NOTE — H&P (View-Only) (Signed)
History of Present Illness: 42 yo female with history of TV endocarditis, former polysubstance abuse, Hepatitis B and C, PFO s/p closure here today for cardiac follow up. I saw her 04/16/13 to re-establish cardiology care. She had been followed in the past in our [place] office by Dr. Freida Busman. 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 At the visit in our office 04/16/13, she c/o dizziness, SOB, fatigue, flu-like symptoms.  She smokes 1 ppd. No chest pain. She has been using a friends supplemental O2 at times because it relaxes her and helps her sleep. I arranged an echo 04/23/13 which showed LVEF=45%, dilated RV and RA, severe TR.   She is here today for follow up. She continues to c/o SOB, dizziness, fatigue and headaches. No syncope. She becomes profoundly dyspneic with exertion.   Primary Care Physician: Dr. Anner Crete [facility])  Past Medical History  Diagnosis Date  . Endocarditis     (TV) with complicated course  . PFO (patent foramen ovale)     s/p closure  . Hepatitis B carrier   . Hepatitis C   . Tobacco abuse     Past Surgical History  Procedure Laterality Date  . Tricuspid valve surgery      Endocarditis debridement  . Patent foramen ovale closure      Current Outpatient Prescriptions  Medication Sig Dispense Refill  . famotidine (PEPCID) 20 MG tablet Take 20 mg by mouth daily.      Marland Kitchen PROAIR HFA 108 (90 BASE) MCG/ACT inhaler       . sertraline (ZOLOFT) 50 MG tablet Take 50 mg by mouth daily.        No current facility-administered medications for this visit.    No Known Allergies  History   Social History  . Marital Status: Divorced    Spouse Name: N/A    Number of Children: 6  . Years of Education: N/A   Occupational History  . disabled    Social History Main Topics  . Smoking status: Current Every Day Smoker -- 1.00  packs/day for 30 years    Types: Cigarettes  . Smokeless tobacco: Not on file  . Alcohol Use: No  . Drug Use: Yes     Comment: last used in 2007  . Sexually Active: Not on file   Other Topics Concern  . Not on file   Social History Narrative  . No narrative on file    Family History  Problem Relation Age of Onset  . Heart disease Neg Hx     Review of Systems:  As stated in the HPI and otherwise negative.   BP 107/77  Pulse 69  Resp 12  Wt 168 lb (76.204 kg)  BMI 27.52 kg/m2  Physical Examination: General: Well developed, well nourished, NAD HEENT: OP clear, mucus membranes moist SKIN: warm, dry. No rashes. Neuro: No focal deficits Musculoskeletal: Muscle strength 5/5 all ext Psychiatric: Mood and affect normal Neck: No JVD, no carotid bruits, no thyromegaly, no lymphadenopathy. Lungs:Clear bilaterally, no wheezes, rhonci, crackles Cardiovascular: Regular rate and rhythm with systolic murmur. No gallops or rubs. Abdomen:Soft. Bowel sounds present. Non-tender.  Extremities: No lower extremity edema. Pulses are 2 + in the bilateral DP/PT.  Echo 04/23/13: Left ventricle: The cavity size was normal. Wall thickness was normal. The estimated ejection fraction was 45%. Diffuse hypokinesis. - Right ventricle: The cavity size  was severely dilated. - Right atrium: The atrium was moderately dilated. - Atrial septum: No defect or patent foramen ovale was identified. - Tricuspid valve: Etiology of the severe TR is not apparent Severe regurgitation.  Assessment and Plan:   1. 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 She is s/p TV debridement in 2007 in setting of endocarditis. No follow up since then. She will need TEE and Right/Left heart cath. We will have surgical follow up arranged after the TEE and cath. Will schedule procedures on 05/08/13. Risks and benefits are reviewed. She agrees to proceed. Will arrange TEE then cath later that day.

## 2013-05-08 NOTE — CV Procedure (Signed)
    Cardiac Catheterization Operative Report  Tina Fitzgerald [id number] 7/8/201412:02 PM No primary provider on file.  Procedure Performed:  1. Left Heart Catheterization 2. Selective Coronary Angiography 3. Right Heart Catheterization 4. Left ventricular angiogram  Operator: Verne Carrow, MD  Indication: 42 yo female with history of TV endocarditis, former polysubstance abuse, Hepatitis B and C, PFO s/p closure here today for cardiac cath. I saw her 04/16/13 to re-establish cardiology care. She had been followed in the past in our [place] office by Dr. Freida Busman. 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 At the visit in our office 04/16/13, she c/o dizziness, SOB, fatigue, flu-like symptoms. She smokes 1 ppd. No chest pain.  I arranged an echo 04/23/13 which showed LVEF=45%, dilated RV and RA, severe TR. TEE this am with severe TR.                                Procedure Details: The risks, benefits, complications, treatment options, and expected outcomes were discussed with the patient. The patient and/or family concurred with the proposed plan, giving informed consent. The patient was brought to the cath lab after IV hydration was begun and oral premedication was given. The patient was further sedated with Versed and Fentanyl. The right groin was prepped and draped in the usual manner. Using the modified Seldinger access technique, a 5 French sheath was placed in the right femoral artery. A 7 French sheath was inserted into the right femoral vein. A balloon tipped catheter was used to perform a right heart catheterization. Standard diagnostic catheters were used to perform selective coronary angiography. A pigtail catheter was used to perform a left ventricular angiogram. There were no immediate complications. The patient was taken to the recovery area in stable  condition.   Hemodynamic Findings: Ao: 152/87              LV: 153/5/12 RA: 12                RV: 29/8/12 PA: 26/5 (mean 16)     PCWP:  8 Fick Cardiac Output: 3.86 L/min Fick Cardiac Index: 2.08 L/min/m2 Central Aortic Saturation: 96% Pulmonary Artery Saturation: 66%  Angiographic Findings:  Left main: No obstructive disease.   Left Anterior Descending Artery:Large caliber vessel that courses to the apex. Several small caliber diagonal branches. No obstructive disease.   Circumflex Artery: Moderate caliber vessel with early obtuse marginal branch. No obstructive disease.  Right Coronary Artery: Large dominant vessel with no obstructive disease.   Left Ventricular Angiogram: LVEF=45% with global hypokinesis.   Impression: 1. No angiographic evidence of CAD 2. Mild global LV systolic dysfunction 3. Known severe tricuspid valve regurgitation  Recommendations: Will review findings of TEE this am and plan referral to see Dr. Cornelius Moras in [place] surgery office for evaluation of her tricuspid valve. Dr. Cornelius Moras performed her tricuspid valve procedure in 2007.        Complications:  None; patient tolerated the procedure well.

## 2013-05-08 NOTE — Interval H&P Note (Signed)
History and Physical Interval Note:  05/08/2013 11:21 AM  Tina Fitzgerald  has presented today for cardiac cath  with the diagnosis of TV regurgitation.  The various methods of treatment have been discussed with the patient and family. After consideration of risks, benefits and other options for treatment, the patient has consented to  Procedure(s): LEFT AND RIGHT HEART CATHETERIZATION WITH CORONARY ANGIOGRAM (N/A) as a surgical intervention .  The patient's history has been reviewed, patient examined, no change in status, stable for surgery.  I have reviewed the patient's chart and labs.  Questions were answered to the patient's satisfaction.     MCALHANY,CHRISTOPHER

## 2013-05-08 NOTE — Progress Notes (Signed)
  Echocardiogram Echocardiogram Transesophageal has been performed.  Mohamud Mrozek 05/08/2013, 10:45 AM

## 2013-05-08 NOTE — CV Procedure (Signed)
See full TEE report in [facility]; low normal LV function; tricuspid leaflets do not coapt; wide open TR.

## 2013-05-09 ENCOUNTER — Encounter (HOSPITAL_COMMUNITY): Payer: Self-pay | Admitting: General Practice

## 2013-05-09 DIAGNOSIS — I079 Rheumatic tricuspid valve disease, unspecified: Secondary | ICD-10-CM

## 2013-05-09 LAB — BASIC METABOLIC PANEL
CO2: 20 mEq/L (ref 19–32)
Calcium: 8.7 mg/dL (ref 8.4–10.5)
Creatinine, Ser: 0.78 mg/dL (ref 0.50–1.10)
GFR calc Af Amer: >90 mL/min (ref >90)
Sodium: 136 mEq/L (ref 135–145)

## 2013-05-09 LAB — CBC
MCH: 31.7 pg (ref 26.0–34.0)
MCV: 90.3 fL (ref 78.0–100.0)
Platelets: 167 10*3/uL (ref 150–400)
RBC: 4.86 MIL/uL (ref 3.87–5.11)
RDW: 13.3 % (ref 11.5–15.5)
WBC: 10 10*3/uL (ref 4.0–10.5)

## 2013-05-09 NOTE — Progress Notes (Signed)
Utilization Review Completed.   Roseana Rhine, RN, BSN Nurse Case Manager  [phone]  

## 2013-05-09 NOTE — Discharge Summary (Signed)
See full note this am. cdm 

## 2013-05-09 NOTE — Discharge Summary (Signed)
Discharge Summary   Patient ID: Tina Fitzgerald MRN: [id number], DOB/AGE: [birth year] 42 y.o. Admit date: 05/08/2013 D/C date:     05/09/2013  Primary Cardiologist: Clifton James  Primary Discharge Diagnoses:  1. Severe TR - for outpt referral to [facility] 2. History of TV endocarditis in 2007 - prolonged hospitalization 2007 requiring surgical debridement of the TV with asystolic cardiac arrest, pericardial tamponade s/p subxiphoid window and PFO closure surgically at that time, along with septic pulmonary emboli, respiratory failure, and renal failure requiring short duration of dialysis at that time 3. R groin hematoma 4. Tobacco abuse  Secondary Discharge Diagnoses:  1. Former polysubstance abuse 2. Hepatitis B 3. Hepatitis C 4. PFO s/p closure at time of TV surgery 5. Pericardial tamponade at time of TV surgery 6. Asthma 7. Memory loss short term - "since [facility] in 2007"  8. GERD 9. Arthritis 10. Anxiety 11. Depression  Hospital Course: Tina Fitzgerald is a 42 y/o F with history of TV endocarditis, former polysubstance abuse, Hepatitis B and C, PFO s/p closure who recently presented to the office to Dr. Clifton James to re-establish cardiac care. She had previously been followed in the past in our  office by Dr. Freida Busman. 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 At office visit on 04/16/13, she c/o dizziness, SOB, fatigue, and flu-like symptoms. Dr. Clifton James arranged an echo 04/23/13 which showed LVEF=45%, dilated RV and RA, severe TR. At her followup visit on 05/02/13, she continued to report SOB, DOE, dizziness, fatigue and headaches. She denied syncope. Dr. Clifton James recommended TEE and cath. She was brought into the hospital 05/08/13 for these procedures. TEE showed low normal  LV function; tricuspid leaflets do not coapt; wide open TR. R&L heart cath showed: 1. No angiographic evidence of CAD  2. Mild global LV systolic dysfunction - EF 45% 3. Known severe tricuspid valve regurgitation Post-procedurally she developed a groin hematoma after getting up to go to the bathroom. Manual pressure was held and she was placed in 23 hour observation. She remained hemodynamically stable. H/H has remained stable. Dr. Clifton James notes that a referral to CT surgery has been placed as an outpatient. The office will call pt with this information. Smoking cessation has been advised. Dr. Clifton James has seen and examined her today and feels she is stable for discharge. He would like to see her back in 4-6 weeks. He does not have any f/u appt in this time frame so we have scheduled with an extender on a day when he is in the office as well.  Discharge Vitals: Blood pressure 132/98, pulse 80, temperature 98 F (36.7 C), temperature source Oral, resp. rate 20, height 5' 5.6" (1.666 m), weight 174 lb 2.6 oz (79 kg), last menstrual period 04/25/2013, SpO2 95.00%.  Labs: Lab Results  Component Value Date   WBC 10.0 05/09/2013   HGB 15.4* 05/09/2013   HCT 43.9 05/09/2013   MCV 90.3 05/09/2013   PLT 167 05/09/2013     Recent Labs Lab 05/09/13 0430  NA 136  K 4.2  CL 108  CO2 20  BUN 17  CREATININE 0.78  CALCIUM 8.7  GLUCOSE 100*    Diagnostic Studies/Procedures   TEE & cath, see above   Discharge Medications     Medication List  famotidine 20 MG tablet  Commonly known as:  PEPCID  Take 20 mg by mouth daily.     hydrOXYzine 25 MG tablet  Commonly known as:  ATARAX/VISTARIL  Take 25 mg by mouth at bedtime as needed. For sleep     ibuprofen 200 MG tablet  Commonly known as:  ADVIL,MOTRIN  Take 200 mg by mouth every 6 (six) hours as needed for pain.     naproxen 500 MG tablet  Commonly known as:  NAPROSYN  Take 500 mg by mouth 2 (two) times daily with a meal. Only takes  during menstrual cycle     omega-3 acid ethyl esters 1 G capsule  Commonly known as:  LOVAZA  Take 1 g by mouth daily.     ONE-A-DAY WOMENS FORMULA PO  Take 1 tablet by mouth daily.     OVER THE COUNTER MEDICATION  Apply 1 application topically daily as needed. For rash     potassium gluconate 595 MG Tabs  Take 595 mg by mouth every other day.     PROAIR HFA 108 (90 BASE) MCG/ACT inhaler  Generic drug:  albuterol  Inhale 2 puffs into the lungs every 6 (six) hours as needed for wheezing or shortness of breath.     sertraline 50 MG tablet  Commonly known as:  ZOLOFT  Take 50 mg by mouth daily.     zinc gluconate 50 MG tablet  Take 50 mg by mouth every 7 (seven) days.        Disposition   The patient will be discharged in stable condition to home. Discharge Orders   Future Appointments Provider Department Dept Phone   06/11/2013 10:00 AM Rosalio Macadamia, NP [facility] Main Office [place]) [phone]   Future Orders Complete By Expires     Diet - low sodium heart healthy  As directed     Increase activity slowly  As directed     Comments:      No driving for 2 days. No lifting over 5 lbs for 1 week. No sexual activity for 1 week. Keep procedure site clean & dry. If you notice increased pain, swelling, bleeding or pus, call/return!  You may shower, but no soaking baths/hot tubs/pools for 1 week.      Follow-up Information   Follow up with [facility]. (The cardiac surgery office will call you to set up an appointment. If you have not heard from them in 3 days, please call their office.)    Contact information:   [address] [employer] [address] [place] [address] [phone]      Follow up with Norma Fredrickson, NP. (You will see Norma Fredrickson NP on a day when Dr. Clifton James is in the office - 06/11/13 at 10am)    Contact information:   [address] [place] [address] [phone] [facility]         Duration of Discharge  Encounter: Greater than 30 minutes including physician and PA time.  Signed, Mahsa Hanser PA-C 05/09/2013, 8:40 AM

## 2013-05-09 NOTE — Progress Notes (Addendum)
    SUBJECTIVE: Feels great. Right groin sore.   BP 142/85  Pulse 62  Temp(Src) 98.1 F (36.7 C) (Oral)  Resp 17  Wt 174 lb 2.6 oz (79 kg)  BMI 28.53 kg/m2  SpO2 94%  LMP 04/25/2013  Intake/Output Summary (Last 24 hours) at 05/09/13 0657 Last data filed at 05/09/13 4098  Gross per 24 hour  Intake    630 ml  Output   1850 ml  Net  -1220 ml    PHYSICAL EXAM General: Well developed, well nourished, in no acute distress. Alert and oriented x 3.  Psych:  Good affect, responds appropriately Neck: No JVD. No masses noted.  Lungs: Clear bilaterally with no wheezes or rhonci noted.  Heart: RRR with no murmurs noted. Abdomen: Bowel sounds are present. Soft, non-tender.  Extremities: No lower extremity edema. Right groin soft. No hematoma.   LABS: Basic Metabolic Panel:  Recent Labs  11/91/47 0430  NA 136  K 4.2  CL 108  CO2 20  GLUCOSE 100*  BUN 17  CREATININE 0.78  CALCIUM 8.7   CBC:  Recent Labs  05/09/13 0430  WBC 10.0  HGB 15.4*  HCT 43.9  MCV 90.3  PLT 167   Current Meds: . famotidine  20 mg Oral Daily  . nicotine  21 mg Transdermal Q24H  . sertraline  50 mg Oral Daily     ASSESSMENT AND PLAN:  1. Severe TR: Referral to CT surgery has been placed as an outpatient.   2. Right groin hematoma: Post diagnostic cath yesterday. Small hematoma post cath. H/H stable this am.   3. Tobacco abuse: Smoking cessation encouraged. Will not d/c with nicotine patch.   D/C home this am. Follow up with me as planned in 4-6 weeks.   Tina Fitzgerald  7/9/20146:57 AM

## 2013-05-14 ENCOUNTER — Ambulatory Visit: Payer: Medicare Other | Admitting: Cardiovascular Disease

## 2013-05-22 ENCOUNTER — Telehealth: Payer: Self-pay | Admitting: Cardiovascular Disease

## 2013-05-22 NOTE — Telephone Encounter (Signed)
Spoke with pt, she usually takes naproxen for menstrual cramps. She wanted to make sure was still okay to take. Med is listed on discharge summ and okay given for pt to use as needed for menstrual cramps.

## 2013-05-22 NOTE — Telephone Encounter (Signed)
New problem   Pt wants to know if something for pain can be called in for her

## 2013-05-24 ENCOUNTER — Other Ambulatory Visit: Payer: Self-pay | Admitting: *Deleted

## 2013-05-24 ENCOUNTER — Encounter: Payer: Self-pay | Admitting: Thoracic Surgery (Cardiothoracic Vascular Surgery)

## 2013-05-24 ENCOUNTER — Institutional Professional Consult (permissible substitution) (INDEPENDENT_AMBULATORY_CARE_PROVIDER_SITE_OTHER): Payer: Medicare Other | Admitting: Thoracic Surgery (Cardiothoracic Vascular Surgery)

## 2013-05-24 VITALS — BP 124/82 | HR 62 | Resp 18 | Ht 65.0 in | Wt 170.0 lb

## 2013-05-24 DIAGNOSIS — I071 Rheumatic tricuspid insufficiency: Secondary | ICD-10-CM

## 2013-05-24 DIAGNOSIS — I079 Rheumatic tricuspid valve disease, unspecified: Secondary | ICD-10-CM

## 2013-05-24 NOTE — Progress Notes (Signed)
[address]       Tina Fitzgerald [address]             [phone]     CARDIOTHORACIC SURGERY CONSULTATION REPORT  Referring Provider is Tina Fitzgerald PCP is Tina Fitzgerald  Chief Complaint  Patient presents with  . 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  At that time she presented with an asystolic cardiac arrest and multiple septic pulmonary emboli with respiratory failure and renal failure.  Surgical debridement of the tricuspid valve was performed when she failed a trial of medical therapy. She was not felt to be candidate for tricuspid valve replacement at the time. She slowly recovered although she did require delayed subxiphoid pericardial window for a large postoperative pericardial effusion.  She gradually recovered after a prolonged convalescence. She reportedly never went back any type of substance abuse, and she has within the past 2 years gotten married. She has been disabled since 2007 because of problems with memory loss attributed to her previous illness and surgery.  She apparently was seen by Tina Fitzgerald at [facility] in [place] in 2010 for right-sided chest discomfort that was not felt to be cardiac origin.  She was otherwise lost to followup from a cardiac standpoint until last month when she presented to Tina Fitzgerald to reestablish care.  Followup transthoracic echocardiogram revealed severe tricuspid regurgitation but also was notable for local left ventricular systolic dysfunction with ejection fraction estimated 45% there was severe right ventricular chamber enlargement.  Subsequently a  trans-esophageal echocardiogram was performed. Left ventricular systolic function appeared more normal on this exam with ejection fraction estimated 55%. The right ventricle was moderately dilated. There was severe tricuspid regurgitation. No other abnormalities were noted.  Left and right heart catheterization was performed and notable for the absence of significant coronary artery disease. There was mild global left ventricular systolic dysfunction with ejection fraction estimated 45%. Pulmonary artery pressures were only slightly elevated. The patient was referred for elective surgical consultation.  The patient is a difficult historian although she is accompanied by her husband who helps considerably. She describes some vague tightness across her chest with exertion. She has mild exertional shortness of breath. She has frequent dizzy spells and headaches of unclear etiology. She has never had syncope.  She has occasional palpitations. She denies any lower extremity edema. She has not had any abdominal bloating.  She has had problems with stress urinary incontinence and recurrent urinary tract infections as well as recurrent skin infections in the perineal region.  Past Medical History  Diagnosis Date  . Endocarditis     a. 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  Marland Kitchen PFO (patent foramen ovale)     a. s/p closure in 2007.  Marland Kitchen Hepatitis B carrier   . Tobacco abuse   . Asthma   . Hepatitis C   . Cardiac tamponade  a. s/p subxiphoid window 2007 in setting of TV endocarditis. b. She had suffered a mild anoxic injury during her recent hospitalization felt to be related to hypoperfusion Tina Fitzgerald 06/15/2006 (05/09/2013)  .  Cardiac arrest     a. 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  . Memory loss, short term     "since OHS in 2007" (05/09/2013)  . GERD (gastroesophageal reflux disease)   . Arthritis     "knees" (05/09/2013)  . Anxiety   . Depression   . ARF (acute renal failure)     a. 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  . Septic pulmonary embolism     a. During admission for TV endocarditis.  . Severe tricuspid regurgitation     a. By TEE/cath 04/2013 - for outpt referral to [facility].     Past Surgical History  Procedure Laterality Date  . Tricuspid valve surgery  04/22/2006    debridement of septal leaflet for MSSA bacterial endocarditis with closure PFO  . Patent foramen ovale closure  04/22/2006    closed during tricuspid valve surgery  . Cardiac catheterization      multiple/notes 06/15/2006 (05/09/2013)  . Subxyphoid pericardial window  05/07/2006    for large postoperative pericardial effusion   . Dilation and curettage of uterus      "couple of those years ago; one a couple years ago after I lost a pregnancy" (05/09/2013)  . Tee without cardioversion N/A 05/08/2013    Procedure: TRANSESOPHAGEAL ECHOCARDIOGRAM (TEE);  Surgeon: Tina Bunting, MD;  Location: [facility] ENDOSCOPY;  Service: Cardiovascular;  Laterality: N/A;    Family History  Problem Relation Age of Onset  . Hyperlipidemia Mother   . Hyperlipidemia Brother   . Heart disease Maternal Grandmother     History   Social History  . Marital Status: Married    Spouse Name: N/A    Number of Children: 6  . Years of Education: N/A   Occupational History  . disabled    Social History Main Topics  . Smoking status: Current Every Day Smoker -- 0.75 packs/day for 30 years    Types: Cigarettes  . Smokeless tobacco: Never Used  . Alcohol Use: No  . Drug Use: Yes    Special: Marijuana, Heroin, "Crack" cocaine, Cocaine     Comment: 05/09/2013 "<1 joint/day; last heroin & crack  was before heart OR in 2007"  . Sexually Active: Yes   Other Topics Concern  . Not on file   Social History Narrative  . No narrative on file    Current Outpatient Prescriptions  Medication Sig Dispense Refill  . famotidine (PEPCID) 20 MG tablet Take 20 mg by mouth daily.      . hydrOXYzine (ATARAX/VISTARIL) 25 MG tablet Take 25 mg by mouth at bedtime as needed. For sleep      . ibuprofen (ADVIL,MOTRIN) 200 MG tablet Take 200 mg by mouth every 6 (six) hours as needed for pain.      . Multiple Vitamins-Calcium (ONE-A-DAY WOMENS FORMULA PO) Take 1 tablet by mouth daily.      . naproxen (NAPROSYN) 500 MG tablet Take 500 mg by mouth 2 (two) times daily with a meal. Only takes during menstrual cycle      . omega-3 acid ethyl esters (LOVAZA) 1 G capsule Take 1 g by mouth daily.      Marland Kitchen OVER THE COUNTER MEDICATION Apply 1 application topically daily as needed. For rash      .  potassium gluconate 595 MG TABS Take 595 mg by mouth every other day.      Marland Kitchen PROAIR HFA 108 (90 BASE) MCG/ACT inhaler Inhale 2 puffs into the lungs every 6 (six) hours as needed for wheezing or shortness of breath.       . sertraline (ZOLOFT) 50 MG tablet Take 50 mg by mouth daily.       Marland Kitchen zinc gluconate 50 MG tablet Take 50 mg by mouth every 7 (seven) days.       No current facility-administered medications for this visit.    No Known Allergies    Review of Systems:   General:  normal appetite, decreased energy, + weight gain, no weight loss, no fever  Cardiac:  + chest pain with exertion, no chest pain at rest, +SOB with exertion, no resting SOB, no PND, no orthopnea, no palpitations, no arrhythmia, no atrial fibrillation, no LE edema, + dizzy spells, no syncope  Respiratory:  + shortness of breath, + home oxygen (she uses a family member's oxygen for treating her headaches), no productive cough, no dry cough, no bronchitis, + wheezing, no hemoptysis, no asthma, no pain with inspiration or cough, no sleep apnea, no  CPAP at night  GI:   no difficulty swallowing, + reflux, + frequent heartburn, no hiatal hernia, no abdominal pain, no constipation, no diarrhea, no hematochezia, no hematemesis, no melena  GU:   no dysuria,  + frequency, + urinary tract infection, + hematuria, no kidney stones, no kidney disease  Vascular:  no pain suggestive of claudication, no pain in feet, no leg cramps, + varicose veins, no DVT, no non-healing foot ulcer  Neuro:   no stroke, no TIA's, no seizures, + headaches, no temporary blindness one eye,  no slurred speech, no peripheral neuropathy, no chronic pain, no instability of gait, + significant memory/cognitive dysfunction  Musculoskeletal: no arthritis, no joint swelling, no myalgias, no difficulty walking, normal mobility   Skin:   no rash, no itching, + recurrent skin infections in perineal region, no pressure sores or ulcerations  Psych:   + anxiety, + depression, + nervousness, + unusual recent stress  Eyes:   no blurry vision, no floaters, no recent vision changes, + wears glasses or contacts  ENT:   no hearing loss, no loose or painful teeth, partial dentures, last saw dentist unknown  Hematologic:  + easy bruising, + abnormal bleeding, no clotting disorder, no frequent epistaxis  Endocrine:  no diabetes, does not check CBG's at home     Physical Exam:   BP 124/82  Pulse 62  Resp 18  Ht 5\' 5"  (1.651 m)  Wt 170 lb (77.111 kg)  BMI 28.29 kg/m2  SpO2 96%  LMP 04/25/2013  General:  Anxious but o/w  well-appearing  HEENT:  Unremarkable   Neck:   no JVD, no bruits, no adenopathy   Chest:   clear to auscultation, symmetrical breath sounds, no wheezes, no rhonchi   CV:   RRR, soft systolic murmur   Abdomen:  soft, non-tender, no masses   Extremities:  warm, well-perfused, pulses palpable, no LE edema  Rectal/GU  Deferred  Neuro:   Grossly non-focal and symmetrical throughout  Skin:   Clean and dry, no rashes, no breakdown   Diagnostic Tests:  Transthoracic  Echocardiography  Patient: Tina Fitzgerald, Tina Fitzgerald MR #: [id number] Study Date: 04/23/2013 Gender: F Age: 67 Height: 165.1cm Weight: 74.8kg BSA: 1.28m^2 Pt. Status: Room:  ATTENDING Charlton Haws SONOGRAPHER Aida Raider, RDCS PERFORMING [facility],  Site [facility] ORDERING McAlhany, Christopher REFERRING Chinchilla, Christopher cc:  ------------------------------------------------------------ LV EF: 45%  ------------------------------------------------------------ Indications: 424.2 Tricuspid valve disease.  ------------------------------------------------------------ History: PMH: Acquired from the patient and from the patient's chart. PMH: History of Tricuspid Valve Endocarditis. Hepatitis B and C. History of PFO. Risk factors: Former Polysubstance Abuse. Former IV Drug Abuse. Current tobacco use.  ------------------------------------------------------------ Study Conclusions  - Left ventricle: The cavity size was normal. Wall thickness was normal. The estimated ejection fraction was 45%. Diffuse hypokinesis. - Right ventricle: The cavity size was severely dilated. - Right atrium: The atrium was moderately dilated. - Atrial septum: No defect or patent foramen ovale was identified. - Tricuspid valve: Etiology of the severe TR is not apparent Severe regurgitation.  ------------------------------------------------------------ Labs, prior tests, procedures, and surgery: Status post Tricuspid Valve Debridement-2007. Status post PFO closure. Transthoracic echocardiography. M-mode, complete 2D, spectral Doppler, and color Doppler. Height: Height: 165.1cm. Height: 65in. Weight: Weight: 74.8kg. Weight: 164.7lb. Body mass index: BMI: 27.5kg/m^2. Body surface area: BSA: 1.29m^2. Blood pressure: 119/87. Patient status: Outpatient. Location: [facility]  3  ------------------------------------------------------------  ------------------------------------------------------------ Left ventricle: The cavity size was normal. Wall thickness was normal. The estimated ejection fraction was 45%. Diffuse hypokinesis.  ------------------------------------------------------------ Aortic valve: Mildly thickened leaflets.  ------------------------------------------------------------ Mitral valve: Mildly thickened leaflets .  ------------------------------------------------------------ Left atrium: The atrium was normal in size.  ------------------------------------------------------------ Atrial septum: No defect or patent foramen ovale was identified.  ------------------------------------------------------------ Right ventricle: The cavity size was severely dilated.  ------------------------------------------------------------ Pulmonic valve: Doppler: Mild regurgitation.  ------------------------------------------------------------ Tricuspid valve: Etiology of the severe TR is not apparent Doppler: Severe regurgitation. Mean gradient: 2mm Hg (D). Peak gradient: 6mm Hg (D).  ------------------------------------------------------------ Right atrium: The atrium was moderately dilated.  ------------------------------------------------------------ Pericardium: The pericardium was normal in appearance.  ------------------------------------------------------------  2D measurements Normal Doppler measurements Normal Left ventricle Left ventricle LVID ED, 39.3 mm 43-52 Ea, lat 17. cm/s ------ chord, ann, tiss 7 PLAX DP LVID ES, 27.6 mm 23-38 E/Ea, lat 3.4 ------ chord, ann, tiss 7 PLAX DP FS, chord, 30 % >29 Ea, med 11 cm/s ------ PLAX ann, tiss LVPW, ED 9.76 mm ------ DP IVS/LVPW 0.8 <1.3 E/Ea, med 5.5 ------ ratio, ED ann, tiss 8 Ventricular septum DP IVS, ED 7.85 mm ------ LVOT LVOT Peak vel, S 75. cm/s ------ Diam, S 23 mm  ------ 2 Area 4.15 cm^2 ------ VTI, S 13. cm ------ Diam 23 mm ------ 9 Aorta Stroke vol 57. ml ------ Root diam, 29 mm ------ 8 ED Stroke 31. ml/m^2 ------ Left atrium index 7 AP dim 39 mm ------ Mitral valve AP dim 2.14 cm/m^2 <2.2 Peak E vel 61. cm/s ------ index 4 Peak A vel 46. cm/s ------ 6 Deceleratio 236 ms 150-23 n time 0 Peak E/A 1.3 ------ ratio Tricuspid valve Mean vel, D 68. cm/s ------ 2 Mean 2 mm Hg ------ gradient, D Peak 6 mm Hg ------ gradient, D Max inflow 121 cm/s ------ vel VTI at 40 cm ------ annulus Regurg peak 217 cm/s ------ vel Peak RV-RA 19 mm Hg ------ gradient, S Right ventricle Sa vel, lat 13. cm/s ------ ann, tiss 3 DP  ------------------------------------------------------------ Prepared and Electronically Authenticated by  Charlton Haws 2014-06-23T16:52:[id number]     Transesophageal Echocardiography  Patient: Tina Fitzgerald, Tina Fitzgerald MR #: [id number] Study Date: 05/08/2013 Gender: F Age: 42 Height: 157.5cm Weight: 76.4kg BSA: 1.3m^2 Pt. Status: Room: [facility]  ADMITTING Olga Millers ATTENDING Crenshaw, Arlys John PERFORMING Olga Millers SONOGRAPHER Dewitt Hoes, RDCS ORDERING [place], Christopher Myna Hidalgo, Christopher cc:  ------------------------------------------------------------  LV EF: 50% - 55%  ------------------------------------------------------------ Indications: Tricuspid insufficiency 424.2.  ------------------------------------------------------------ Study Conclusions  - Left ventricle: Systolic function was normal. The estimated ejection fraction was in the range of 50% to 55%. Hypokinesis of the distalanteroseptal myocardium. - Aortic valve: No evidence of vegetation. - Mitral valve: No evidence of vegetation. - Right ventricle: The cavity size was moderately dilated. Systolic function was mildly reduced. - Right atrium: The atrium was moderately dilated. - Atrial septum: No defect or patent  foramen ovale was identified. There was an atrial septal aneurysm. - Tricuspid valve: Mobility of the septal leaflet was restricted. No evidence of vegetation. Wide-open regurgitation. - Pulmonic valve: No evidence of vegetation. Transesophageal echocardiography. 2D and color Doppler. Height: Height: 157.5cm. Height: 62in. Weight: Weight: 76.4kg. Weight: 168lb. Body mass index: BMI: 30.8kg/m^2. Body surface area: BSA: 1.58m^2. Blood pressure: 120/62. Patient status: Outpatient. Location: Endoscopy.  ------------------------------------------------------------  ------------------------------------------------------------ Left ventricle: Systolic function was lownormal. The estimated ejection fraction was in the range of 50% to 55%. Regional wall motion abnormalities: Hypokinesis of the distalanteroseptal myocardium.  ------------------------------------------------------------ Aortic valve: Structurally normal valve. Trileaflet. Cusp separation was normal. No evidence of vegetation. Doppler: There was no stenosis. No regurgitation.  ------------------------------------------------------------ Aorta: Descending aorta: Mild atherosclerosis.  ------------------------------------------------------------ Mitral valve: Structurally normal valve. Leaflet separation was normal. No evidence of vegetation. Doppler: Trivial regurgitation.  ------------------------------------------------------------ Left atrium: The atrium was normal in size.  ------------------------------------------------------------ Atrial septum: No defect or patent foramen ovale was identified. There was an atrial septal aneurysm.  ------------------------------------------------------------ Right ventricle: The cavity size was moderately dilated. Systolic function was mildly reduced.  ------------------------------------------------------------ Pulmonic valve: Structurally normal valve. Cusp separation was  normal. No evidence of vegetation. Doppler: No regurgitation.  ------------------------------------------------------------ Tricuspid valve: Tricuspid valve leaflets do not coapt. Mobility of the septal leaflet was restricted. No evidence of vegetation. Doppler: Wide-open regurgitation.  ------------------------------------------------------------ Right atrium: The atrium was moderately dilated.  ------------------------------------------------------------ Pericardium: There was no pericardial effusion.  ------------------------------------------------------------ Prepared and Electronically Authenticated by  Olga Millers 2014-07-08T18:57:[id number]     Cardiac Catheterization Operative Report  Tina Fitzgerald  [id number]  7/8/201412:02 PM  No primary provider on file.  Procedure Performed:  1. Left Heart Catheterization 2. Selective Coronary Angiography 3. Right Heart Catheterization 4. Left ventricular angiogram Operator: Tina Carrow, MD  Indication: 42 yo female with history of TV endocarditis, former polysubstance abuse, Hepatitis B and C, PFO s/p closure here today for cardiac cath. I saw her 04/16/13 to re-establish cardiology care. She had been followed in the past in our [place] office by Tina. Freida Fitzgerald. 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 At the visit in our office 04/16/13, she c/o dizziness, SOB, fatigue, flu-like symptoms. She smokes 1 ppd. No chest pain. I arranged an echo 04/23/13 which showed LVEF=45%, dilated RV and RA, severe TR. TEE this am with severe TR.  Procedure Details:  The risks, benefits, complications, treatment options, and expected outcomes were discussed with the patient. The patient and/or family concurred with the proposed plan, giving informed consent. The patient was brought to the cath lab after IV hydration was  begun and oral premedication was given. The patient was further sedated with Versed and Fentanyl. The right groin was prepped and draped in the usual manner. Using the modified Seldinger access technique, a 5 French sheath was placed in the right femoral artery. A 7 French sheath was inserted into the right  femoral vein. A balloon tipped catheter was used to perform a right heart catheterization. Standard diagnostic catheters were used to perform selective coronary angiography. A pigtail catheter was used to perform a left ventricular angiogram. There were no immediate complications. The patient was taken to the recovery area in stable condition.  Hemodynamic Findings:  Ao: 152/87  LV: 153/5/12  RA: 12  RV: 29/8/12  PA: 26/5 (mean 16)  PCWP: 8  Fick Cardiac Output: 3.86 L/min  Fick Cardiac Index: 2.08 L/min/m2  Central Aortic Saturation: 96%  Pulmonary Artery Saturation: 66%  Angiographic Findings:  Left main: No obstructive disease.  Left Anterior Descending Artery:Large caliber vessel that courses to the apex. Several small caliber diagonal branches. No obstructive disease.  Circumflex Artery: Moderate caliber vessel with early obtuse marginal branch. No obstructive disease.  Right Coronary Artery: Large dominant vessel with no obstructive disease.  Left Ventricular Angiogram: LVEF=45% with global hypokinesis.  Impression:  1. No angiographic evidence of CAD  2. Mild global LV systolic dysfunction  3. Known severe tricuspid valve regurgitation  Recommendations: Will review findings of TEE this am and plan referral to see Tina. Cornelius Moras in [place] surgery office for evaluation of her tricuspid valve. Tina. Cornelius Moras performed her tricuspid valve procedure in 2007.  Complications: None; patient tolerated the procedure well.      Impression:  The patient has severe tricuspid regurgitation related to previous surgical debridement of the valve for management of medically refractory bacterial endocarditis in  the setting of IV drug abuse in 2007.  Recent followup echocardiogram demonstrates severe right ventricular chamber enlargement, perhaps with some secondary effects on left ventricular function, although this seemed less impressive on TEE. The patient does not have coronary artery disease and right heart pressures are essentially normal.  The patient has some symptoms of exertional shortness of breath and atypical chest pain that may or may not be related to her underlying tricuspid regurgitation and right heart dysfunction, but overall she does not seem to have any symptoms of right-sided heart failure at this time.  The patient's biggest physical complaints are that of recurrent dizzy spells and headaches of unclear etiology.  She also suffers from recurrent urinary tract infections and recurrent skin infections in the perineal region.    Overall I am quite concerned by the patient's significant right ventricular chamber enlargement seen on followup echocardiogram, potentially with associated depressed left ventricular function.  She appears to have successfully rehabilitated from her previous problems with IV drug abuse, and it might be reasonable to consider elective tricuspid valve repair or replacement.  However, surgical intervention would come with significant likelihood that the valve may need to be replaced, and this would also be associated with significant risk for postoperative complete heart block requiring pacemaker placement.  Even with successful valve repair the patient would have at the very minimum a tricuspid annuloplasty ring which could be a source for infection given her problems with recurrent bladder infections and skin infections.  Finally, the patient has not seen a dentist in several years.  All of these issues raise concerns as to whether or not surgical intervention should be considered at this time.   Plan:  We will send the patient for routine dental consultation. We will  discuss matters at length with Tina Fitzgerald and consider referring the patient to a gynecologist and/or urologist locally hearing [facility] to further assist with management of her recurrent urinary and pelvic skin infections.    Salvatore Decent. Cornelius Moras, MD 05/24/2013 7:15 PM

## 2013-05-25 ENCOUNTER — Encounter: Payer: Self-pay | Admitting: *Deleted

## 2013-05-28 ENCOUNTER — Ambulatory Visit (HOSPITAL_COMMUNITY): Payer: Self-pay | Admitting: Dentistry

## 2013-05-28 ENCOUNTER — Encounter (HOSPITAL_COMMUNITY): Payer: Self-pay | Admitting: Dentistry

## 2013-05-28 VITALS — BP 109/75 | HR 72 | Temp 98.2°F

## 2013-05-28 DIAGNOSIS — M264 Malocclusion, unspecified: Secondary | ICD-10-CM

## 2013-05-28 DIAGNOSIS — Z0189 Encounter for other specified special examinations: Secondary | ICD-10-CM

## 2013-05-28 DIAGNOSIS — IMO0002 Reserved for concepts with insufficient information to code with codable children: Secondary | ICD-10-CM

## 2013-05-28 DIAGNOSIS — K036 Deposits [accretions] on teeth: Secondary | ICD-10-CM

## 2013-05-28 DIAGNOSIS — I38 Endocarditis, valve unspecified: Secondary | ICD-10-CM

## 2013-05-28 DIAGNOSIS — K0530 Chronic periodontitis, unspecified: Secondary | ICD-10-CM

## 2013-05-28 DIAGNOSIS — K0889 Other specified disorders of teeth and supporting structures: Secondary | ICD-10-CM

## 2013-05-28 DIAGNOSIS — K08409 Partial loss of teeth, unspecified cause, unspecified class: Secondary | ICD-10-CM

## 2013-05-28 DIAGNOSIS — I079 Rheumatic tricuspid valve disease, unspecified: Secondary | ICD-10-CM

## 2013-05-28 DIAGNOSIS — K089 Disorder of teeth and supporting structures, unspecified: Secondary | ICD-10-CM

## 2013-05-28 MED ORDER — AMOXICILLIN 500 MG PO CAPS: ORAL_CAPSULE | ORAL | Status: DC

## 2013-05-28 MED ORDER — CHLORHEXIDINE GLUCONATE 0.12 % MT SOLN: OROMUCOSAL | Status: DC

## 2013-05-28 NOTE — Patient Instructions (Signed)
The patient is to return to clinic for initial periodontal therapy with antibiotic premedication given one hour prior dental appointment. Patient is to use on amoxicillin 500 mg and take four capsules one hour prior to dental appointment. Patient is to brush after meals and at bedtime. Patient is to floss at bedtime. Patient is to use chlorhexidine rinses twice daily after breakfast and at bedtime to aid in this infection of the oral cavity.  Dr. Kristin Bruins

## 2013-05-28 NOTE — Progress Notes (Signed)
DENTAL CONSULTATION  Date of Consultation:  05/28/2013 Patient Name:   Tina Fitzgerald Date of Birth:   1971/09/20 Medical Record Number: [id number]  VITALS: BP 109/75  Pulse 72  Temp(Src) 98.2 F (36.8 C)  LMP 04/25/2013  CHIEF COMPLAINT: The patient was referred by Dr. Cornelius Moras for a medically necessary pre-heart valve surgery dental protocol examination.  HPI: Tina Fitzgerald is a 42 year old female recently diagnosed with severe tricuspid regurgitation. Patient with anticipated tricuspid valve repair or replacement with Dr. Cornelius Moras. Patient now seen as part of a medically necessary pre-heart valve surgery dental protocol examination to rule out dental infection that may affect the patient's systemic health and anticipated heart valve surgery.  Patient currently denies acute toothache, swellings, or abscesses. The patient does have some occasional sensitivity associated with the upper right molar at the gum line. This is an area of gingival recession.  Patient last saw the dentist approximately one year ago in [place], [place] for evaluation for implant therapy to replace tooth #9. Patient was UNable to proceed with that treatment due to economic restrictions. Patient also had a lower tooth extracted last year in [place], [place] with no complications by report. The patient, nor her husband, can remember the names of the dentists seen for previous treatment and evaluations. The patient has a maxillary acrylic partial denture to replace tooth #9. This was fabricated approximately 2007 or 2008.  The patient currently is having no problems with this maxillary acrylic partial denture. Patient has not been seen for a dental cleaning for periodontal therapy appointment for " a long time".   Patient Active Problem List   Diagnosis Date Noted  . Severe tricuspid regurgitation 05/24/2013  . History of tricuspid valve disorder 04/16/2013  . Tobacco abuse 04/16/2013    PMH: Past Medical History  Diagnosis Date   . Endocarditis     a. 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  Marland Kitchen PFO (patent foramen ovale)     a. s/p closure in 2007.  Marland Kitchen Hepatitis B carrier   . Tobacco abuse   . Asthma   . Hepatitis C   . Cardiac tamponade     a. s/p subxiphoid window 2007 in setting of TV endocarditis. b. She had suffered a mild anoxic injury during her recent hospitalization felt to be related to hypoperfusion [name] 06/15/2006 (05/09/2013)  . Cardiac arrest     a. 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  . Memory loss, short term     "since OHS in 2007" (05/09/2013)  . GERD (gastroesophageal reflux disease)   . Arthritis     "knees" (05/09/2013)  . Anxiety   . Depression   . ARF (acute renal failure)     a. 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  . Septic pulmonary embolism     a. During admission for TV endocarditis.  . Severe tricuspid regurgitation     a. By TEE/cath 04/2013 - for outpt referral to [facility].     PSH: Past Surgical History  Procedure Laterality Date  . Tricuspid valve surgery  04/22/2006    debridement of septal leaflet for MSSA bacterial endocarditis with closure PFO  . Patent foramen ovale closure  04/22/2006  closed during tricuspid valve surgery  . Cardiac catheterization      multiple/notes 06/15/2006 (05/09/2013)  . Subxyphoid pericardial window  05/07/2006    for large postoperative pericardial effusion   . Dilation and curettage of uterus      "couple of those years ago; one a couple years ago after I lost a pregnancy" (05/09/2013)  . Tee without cardioversion N/A 05/08/2013    Procedure: TRANSESOPHAGEAL ECHOCARDIOGRAM (TEE);  Surgeon:  Lewayne Bunting, MD;  [facility] ENDOSCOPY;  Service: Cardiovascular;  Laterality: N/A;    ALLERGIES: Allergies  Allergen Reactions  . Latex Rash    " I'm allergic to latex, and it breaks by skin out in an itchy rash"   MEDICATIONS: Current Outpatient Prescriptions  Medication Sig Dispense Refill  . famotidine (PEPCID) 20 MG tablet Take 20 mg by mouth daily.      . hydrOXYzine (ATARAX/VISTARIL) 25 MG tablet Take 25 mg by mouth at bedtime as needed. For sleep      . ibuprofen (ADVIL,MOTRIN) 200 MG tablet Take 200 mg by mouth every 6 (six) hours as needed for pain.      . Multiple Vitamins-Calcium (ONE-A-DAY WOMENS FORMULA PO) Take 1 tablet by mouth daily.      . naproxen (NAPROSYN) 500 MG tablet Take 500 mg by mouth 2 (two) times daily with a meal. Only takes during menstrual cycle      . omega-3 acid ethyl esters (LOVAZA) 1 G capsule Take 1 g by mouth daily.      Marland Kitchen OVER THE COUNTER MEDICATION Apply 1 application topically daily as needed. For rash      . potassium gluconate 595 MG TABS Take 595 mg by mouth every other day.      Marland Kitchen PROAIR HFA 108 (90 BASE) MCG/ACT inhaler Inhale 2 puffs into the lungs every 6 (six) hours as needed for wheezing or shortness of breath.       . sertraline (ZOLOFT) 50 MG tablet Take 50 mg by mouth daily.       Marland Kitchen zinc gluconate 50 MG tablet Take 50 mg by mouth every 7 (seven) days.       No current facility-administered medications for this visit.    LABS: Lab Results  Component Value Date   WBC 10.0 05/09/2013   HGB 15.4* 05/09/2013   HCT 43.9 05/09/2013   MCV 90.3 05/09/2013   PLT 167 05/09/2013      Component Value Date/Time   NA 136 05/09/2013 0430   K 4.2 05/09/2013 0430   CL 108 05/09/2013 0430   CO2 20 05/09/2013 0430   GLUCOSE 100* 05/09/2013 0430   BUN 17 05/09/2013 0430   CREATININE 0.78 05/09/2013 0430   CALCIUM 8.7 05/09/2013 0430   GFRNONAA >90 05/09/2013 0430   GFRAA >90 05/09/2013 0430   Lab Results  Component Value Date   INR 1.1* 05/02/2013   No  results found for this basename: PTT    SOCIAL HISTORY: History   Social History  . Marital Status: Married    Spouse Name: N/A    Number of Children: 6  . Years of Education: N/A   Occupational History  . disabled    Social History Main Topics  . Smoking status: Current Every Day Smoker -- 0.75 packs/day for 30 years    Types: Cigarettes  . Smokeless tobacco: Never Used  . Alcohol Use: No  . Drug Use: Yes    Special: Marijuana, Heroin, "Crack" cocaine, Cocaine  Comment: 05/09/2013 "<1 joint/day; last heroin & crack was before heart OR in 2007"  . Sexually Active: Yes   Other Topics Concern  . Not on file   Social History Narrative  . No narrative on file    FAMILY HISTORY: Family History  Problem Relation Age of Onset  . Hyperlipidemia Mother   . Hyperlipidemia Brother   . Heart disease Maternal Grandmother      REVIEW OF SYSTEMS: Reviewed with the patient and husband today and reviewed from Dr. Orvan July note of 05/24/13.  DENTAL HISTORY: CHIEF COMPLAINT: The patient was referred by Dr. Cornelius Moras for a medically necessary pre-heart valve surgery dental protocol examination.  HPI: REYGAN Tina Fitzgerald is a 42 year old female recently diagnosed with severe tricuspid regurgitation. Patient with anticipated tricuspid valve repair or replacement with Dr. Cornelius Moras. Patient now seen as part of a medically necessary pre-heart valve surgery dental protocol examination to rule out dental infection that may affect the patient's systemic health and anticipated heart valve surgery.  Patient currently denies acute toothache, swellings, or abscesses. The patient does have some occasional sensitivity associated with the upper right molar at the gum line. This is an area of gingival recession.  Patient last saw the dentist approximately one year ago in [place], [place] for evaluation for implant therapy to replace tooth #9. Patient was UNable to proceed with that treatment due to economic restrictions.  Patient also had a lower tooth extracted last year in [place], [place] with no complications by report. The patient, nor her husband, can remember the names of the dentists seen for previous treatment and evaluations. The patient has a maxillary acrylic partial denture to replace tooth #9. This was fabricated approximately 2007 or 2008.  The patient currently is having no problems with this maxillary acrylic partial denture. Patient has not been seen for a dental cleaning for periodontal therapy appointment for " a long time".   DENTAL EXAMINATION:  GENERAL: The patient is a well-developed, well-nourished female in no acute distress. HEAD AND NECK: There is no palpable submandibular lymphadenopathy. The patient denies acute TMJ symptoms. INTRAORAL EXAM: Patient has normal saliva. I do not see any evidence of abscess formation. DENTITION: Patient is missing tooth numbers 1, 4, 9, 13, 16, 17, 21, 24, 28, and 32. Multiple premolar teeth were extracted for orthodontic therapy as a teenager. Many of the spaces have an closed with the orthodontic therapy.  PERIODONTAL: The patient has chronic periodontitis with plaque and calculus accumulations, selective areas gingival recession, and mandibular anterior tooth mobility as charted. Patient has a 5 mm dehiscence on the facial aspect of tooth #25. This tooth also has class I mobility. DENTAL CARIES/SUBOPTIMAL RESTORATIONS: No obvious dental caries are noted. The patient does have a fracture line on the lingual of tooth #19. This tooth could be evaluated for crown restoration. ENDODONTIC: The patient currently denies acute pulpitis symptoms. I do not see any evidence of periapical pathology. CROWN AND BRIDGE: There are no crown or bridge restorations. The patient could be evaluated for future crown or bridge therapy by the primary dentist of her choice  PROSTHODONTIC: The patient has a maxillary acrylic partial denture replacing tooth #9. The patient indicates that  this partial denture" fits fine". The retention and stability of this acrylic partial denture, however, is less than ideal. This replacement also is at risk for aspiration and patient was cautioned on the use and care of this aesthetic replacement. Patient was encouraged to take this appliance out at bedtime. She  has not been doing this. OCCLUSION: The patient has a poor occlusal scheme secondary to multiple missing teeth, supra-eruption and drifting of the unopposed teeth into the edentulous areas, and lack of replacement of missing teeth with clinically acceptable dental prostheses. The occlusion does appear to be stable however.  RADIOGRAPHIC INTERPRETATION: An orthopantogram was obtained and supplemented with a full series of dental radiographs.  ASSESSMENTS: 1. Chronic periodontitis with bone loss 2. Plaque and calculus accumulations 3. Gingival recession 4. Incipient tooth mobility 5. Multiple missing teeth. 6. Supra-eruption and drifting of the unopposed teeth into the edentulous areas 7. History of orthodontic therapy as a teenager 8. Multiple flexure lesions 9. Periodontal dehiscence on the facial of tooth #25 with a class I mobility 10. History of maxillary acrylic partial denture with less than ideal retention and stability and some risk for aspiration/ingestion of the appliance 11. Poor occlusal scheme but a stable occlusion at this time 12. Fracture line on the lingual of tooth #19 with questionable need for full coverage restoration in the future 13. History of endocarditis with need for antibiotic premedication prior to invasive dental procedures according to American Heart Association guidelines 14. History of latex allergy with need for latex precautions.  PLAN/RECOMMENDATIONS: 1. I discussed the risks, benefits, and complications of various treatment options with the patient in relationship to to her current medical and dental conditions, history of endocarditis, tricuspid  regurgitation, and anticipated heart valve surgery. We discussed various treatment options to include no treatment, extraction of tooth #25 with alveoloplasty, pre-prosthetic surgery as indicated, periodontal therapy, dental restorations, root canal therapy, crown and bridge therapy, implant therapy, and replacement of missing teeth as indicated. The patient currently wishes to proceed with periodontal therapy with antibiotic premedication.  This has been scheduled for Wednesday, 05/30/2013 at 10:30 AM. Patient will take 2 G of amoxicillin by mouth one hour prior to dental appointment. This prescription has been faxed to [facility] pharmacy.  The patient will then followup with a primary dentist of her choice for routine dental care and discussion of other dental treatment needs. The patient adamantly refused extraction of tooth #25 at this time. The patient was again informed of the potential risk for aspiration or ingestion of the maxillary acrylic partial denture. The patient is to start taking the acrylic partial denture out at bedtime. The patient is to start using chlorhexidine rinses twice daily as instructed. This prescription was also faxed to [facility] pharmacy. The patient apparently has a latex allergy and latex precautions will be utilized during dental treatment.  2. Discussion of findings with medical team and coordination of future medical and dental care.  Charlynne Pander, DDS

## 2013-05-30 ENCOUNTER — Ambulatory Visit (HOSPITAL_COMMUNITY): Payer: Self-pay | Admitting: Dentistry

## 2013-05-30 ENCOUNTER — Encounter (HOSPITAL_COMMUNITY): Payer: Self-pay | Admitting: Dentistry

## 2013-05-30 VITALS — BP 119/82 | HR 69 | Temp 97.7°F

## 2013-05-30 DIAGNOSIS — K036 Deposits [accretions] on teeth: Secondary | ICD-10-CM

## 2013-05-30 DIAGNOSIS — K0530 Chronic periodontitis, unspecified: Secondary | ICD-10-CM

## 2013-05-30 DIAGNOSIS — I079 Rheumatic tricuspid valve disease, unspecified: Secondary | ICD-10-CM

## 2013-05-30 DIAGNOSIS — I38 Endocarditis, valve unspecified: Secondary | ICD-10-CM

## 2013-05-30 MED ORDER — CEPHALEXIN 500 MG PO CAPS: ORAL_CAPSULE | ORAL | Status: DC

## 2013-05-30 NOTE — Patient Instructions (Signed)
The patient is to brush her teeth after meals and at bedtime. Patient is to floss at bedtime. Patient is to followup for general dentist once medically stable from the anticipated valve surgery for routine periodontal maintenance. Patient is to use Keflex/cephalexin prior to invasive dental procedures as per American heart association guidelines. Patient is now dentally cleared for tricuspid valve surgery as indicated.  Dr. Kristin Bruins

## 2013-05-30 NOTE — Progress Notes (Signed)
05/30/2013  Patient:            Tina Fitzgerald Date of Birth:  Dec 02, 1970 MRN:                [id number]  BP 119/82  Pulse 69  Temp(Src) 97.7 F (36.5 C) (Oral)  LMP 04/25/2013  Tina Fitzgerald presents for periodontal therapy today. Patient took 2 Grams of cephalexin antibiotic therapy one hour prior to invasive dental procedures per American Heart Association guidelines. Patient agrees to proceed with dental cleaning today. Patient refused extraction of tooth number 25 again today.  Procedure: Adult prophylaxis with sonic scaler and hand curettes. [place]. Floss. Oral hygiene instructions provided. 30 second chlorhexidine rinse provided preoperatively and postoperatively. Patient tolerated the procedure well. Patient is cleared for heart valve surgery at this time. Patient was instructed to followup with her regular general dentist once medically stable from the anticipated heart valve surgery in approximately 4-6 months. Patient will require antibiotic premedication prior to invasive dental procedures. Patient dismissed in stable condition.  Charlynne Pander, DDS

## 2013-05-31 ENCOUNTER — Encounter (HOSPITAL_COMMUNITY): Payer: Medicare Other

## 2013-05-31 ENCOUNTER — Telehealth: Payer: Self-pay | Admitting: *Deleted

## 2013-05-31 NOTE — Telephone Encounter (Signed)
Spoke with pt and gave her information from Dr. Clifton James. Her gynecologist is Dr. Juliene Pina at [facility] in [place]. She will contact them to schedule office visit. I have asked her to forward notes from this office visit to Dr. Clifton James and Dr. Barry Dienes.  I told her Dr. Clifton James will complete paperwork and we would call her when it was ready.

## 2013-05-31 NOTE — Telephone Encounter (Signed)
I tried to reach pt to discuss message below from Dr. Clifton James but voicemail is full. No other number listed for pt. Will continue to try to reach her.  ----------------------------------------  Would you let Tina Fitzgerald know that I will fill out that paperwork for her but it may not be ready until next week. Also, can we ask her to set up an appt with OB-Gyn for evaluation of her issues with pelvic infections (see CT surgery note from last week). Thanks, chris

## 2013-06-06 ENCOUNTER — Ambulatory Visit: Payer: Medicare Other | Admitting: Cardiovascular Disease

## 2013-06-06 NOTE — Telephone Encounter (Signed)
Spoke with pt and told her paperwork has been completed and will be mailed to her.

## 2013-06-07 ENCOUNTER — Ambulatory Visit (HOSPITAL_COMMUNITY)
Admission: RE | Admit: 2013-06-07 | Discharge: 2013-06-07 | Disposition: A | Discharge status: Home / Self Care | Payer: Medicare Other | Source: Ambulatory Visit | Attending: Thoracic Surgery (Cardiothoracic Vascular Surgery) | Admitting: Thoracic Surgery (Cardiothoracic Vascular Surgery)

## 2013-06-07 ENCOUNTER — Encounter: Payer: Self-pay | Admitting: Cardiovascular Disease

## 2013-06-07 DIAGNOSIS — F17200 Nicotine dependence, unspecified, uncomplicated: Secondary | ICD-10-CM | POA: Insufficient documentation

## 2013-06-07 DIAGNOSIS — I079 Rheumatic tricuspid valve disease, unspecified: Secondary | ICD-10-CM

## 2013-06-07 DIAGNOSIS — Z01811 Encounter for preprocedural respiratory examination: Secondary | ICD-10-CM | POA: Insufficient documentation

## 2013-06-07 LAB — PULMONARY FUNCTION TEST

## 2013-06-07 MED ORDER — ALBUTEROL SULFATE (5 MG/ML) 0.5% IN NEBU
2.5000 mg | INHALATION_SOLUTION | Freq: Once | RESPIRATORY_TRACT | Status: AC
  Administered 2013-06-07: 2.5 mg via RESPIRATORY_TRACT

## 2013-06-11 ENCOUNTER — Ambulatory Visit (INDEPENDENT_AMBULATORY_CARE_PROVIDER_SITE_OTHER): Payer: Medicare Other | Admitting: Nurse Practitioner

## 2013-06-11 ENCOUNTER — Encounter: Payer: Self-pay | Admitting: Nurse Practitioner

## 2013-06-11 VITALS — BP 108/80 | HR 72 | Ht 65.5 in | Wt 169.4 lb

## 2013-06-11 DIAGNOSIS — I079 Rheumatic tricuspid valve disease, unspecified: Secondary | ICD-10-CM

## 2013-06-11 NOTE — Patient Instructions (Addendum)
See Dr. Cornelius Moras this Wednesday - his office will be calling you with time  Stay on your current medicines for now  We will see you back after your surgery  Call the [facility] office at [phone] if you have any questions, problems or concerns.

## 2013-06-11 NOTE — Progress Notes (Addendum)
Tina Fitzgerald Date of Birth: 04-20-1971 Medical Record #[id number]  History of Present Illness: Ms. Tina Fitzgerald is seen back today for a post hospital visit. Seen for Dr. Clifton Fitzgerald. She is a former polysubstance abuser, has had Hepatitis B & C, prior PFO with closure at time of her original TV surgery for endocarditis back in 2007 that was also associated with septic pulmonary emboli, respiratory failure and renal failure that required short term dialysis and pericardial tamponande with subxiphoid window at time of TV surgery as well, asthma, short term memory issues since her heart surgery, GERD, anxiety, depression and OA.   She came back here to reestablish cardiology care back in June. Was complaining of dizziness, SOB and fatigue. Had echo updated showing severe TR with EF 45%, dilated RV and RA. Then referred for TEE and cath. No angiographic evidence of CAD noted. EF is 45%. TEE showed that the tricuspid leaflets do not coapt with wide open TR. Most recently hospitalized back in early July following cardiac cath due to groin hematoma. Has been referred to [facility] for surgical intervention. Groin has done fine with no problems noted.   Comes back today. Here with her husband. Very nervous and tearful. Says she still "knows something is wrong". Feels achy. Still short of breath. Still smoking and has switched to E cigs. Has seen the dentist and saw a GYN in [place]. Waiting to see Dr. Cornelius Fitzgerald but her appointment is with Dr. Tyrone Fitzgerald instead??? She admits that she has "mental issues and it is hard for her to cope".   Current Outpatient Prescriptions  Medication Sig Dispense Refill  . cephALEXin (KEFLEX) 500 MG capsule Take four (4) capsules one hour prior dental appointment.  4 capsule  1  . chlorhexidine (PERIDEX) 0.12 % solution Rinse with 15 mls twice daily for 30 seconds. Use after breakfast and at bedtime. Spit out excess. Do not swallow.  480 mL  6  . famotidine (PEPCID) 20 MG tablet Take 20 mg by  mouth daily.      . hydrOXYzine (ATARAX/VISTARIL) 25 MG tablet Take 25 mg by mouth at bedtime as needed. For sleep      . ibuprofen (ADVIL,MOTRIN) 200 MG tablet Take 200 mg by mouth every 6 (six) hours as needed for pain.      . Multiple Vitamins-Calcium (ONE-A-DAY WOMENS FORMULA PO) Take 1 tablet by mouth daily.      . naproxen (NAPROSYN) 500 MG tablet Take 500 mg by mouth 2 (two) times daily with a meal. Only takes during menstrual cycle      . omega-3 acid ethyl esters (LOVAZA) 1 G capsule Take 1 g by mouth daily.      . potassium gluconate 595 MG TABS Take 595 mg by mouth every other day.      Marland Kitchen PROAIR HFA 108 (90 BASE) MCG/ACT inhaler Inhale 2 puffs into the lungs every 6 (six) hours as needed for wheezing or shortness of breath.       . sertraline (ZOLOFT) 50 MG tablet Take 50 mg by mouth daily.       Marland Kitchen zinc gluconate 50 MG tablet Take 50 mg by mouth every 7 (seven) days.       No current facility-administered medications for this visit.    Allergies  Allergen Reactions  . Amoxicillin Other (See Comments)    " I get very sick with this medication"  . Latex Rash    " I'm allergic to latex, and it breaks by skin  out in an itchy rash"    Past Medical History  Diagnosis Date  . Endocarditis     a. 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  Marland Kitchen PFO (patent foramen ovale)     a. s/p closure in 2007.  Marland Kitchen Hepatitis B carrier   . Tobacco abuse   . Asthma   . Hepatitis C   . Cardiac tamponade     a. s/p subxiphoid window 2007 in setting of TV endocarditis. b. She had suffered a mild anoxic injury during her recent hospitalization felt to be related to hypoperfusion Tina Fitzgerald 06/15/2006 (05/09/2013)  . Cardiac  arrest     a. 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  . Memory loss, short term     "since OHS in 2007" (05/09/2013)  . GERD (gastroesophageal reflux disease)   . Arthritis     "knees" (05/09/2013)  . Anxiety   . Depression   . ARF (acute renal failure)     a. 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  . Septic pulmonary embolism     a. During admission for TV endocarditis.  . Severe tricuspid regurgitation     a. By TEE/cath 04/2013 - for outpt referral to [facility].     Past Surgical History  Procedure Laterality Date  . Tricuspid valve surgery  04/22/2006    debridement of septal leaflet for MSSA bacterial endocarditis with closure PFO  . Patent foramen ovale closure  04/22/2006    closed during tricuspid valve surgery  . Cardiac catheterization      multiple/notes 06/15/2006 (05/09/2013)  . Subxyphoid pericardial window  05/07/2006    for large postoperative pericardial effusion   . Dilation and curettage of uterus      "couple of those years ago; one a couple years ago after I lost a pregnancy" (05/09/2013)  . Tee without cardioversion N/A 05/08/2013    Procedure: TRANSESOPHAGEAL ECHOCARDIOGRAM (TEE);  Surgeon: Tina Bunting, MD;  Location: [facility] ENDOSCOPY;  Service: Cardiovascular;  Laterality: N/A;    History  Smoking status  . Current Every Day Smoker -- 0.75 packs/day for 30 years  . Types: Cigarettes  Smokeless tobacco  . Never Used    History  Alcohol Use No    Family History  Problem Relation Age of Onset  . Hyperlipidemia Mother   . Hyperlipidemia Brother   . Heart disease Maternal Grandmother     Review of Systems: The review of systems is per the HPI.  All other systems were reviewed and are negative.  Physical Exam: BP 108/80  Pulse 72  Ht 5' 5.5" (1.664 m)  Wt 169 lb 6.4 oz (76.839 kg)  BMI 27.75 kg/m2 Patient is quite nervous but in no acute distress. Smells of tobacco. Skin is warm and dry.  Color is normal.  HEENT is unremarkable. Normocephalic/atraumatic. PERRL. Sclera are nonicteric. Neck is supple. No masses. No JVD. Lungs are clear. Cardiac exam shows a regular rate and rhythm. Soft systolic murmur. Abdomen is soft. Extremities are without edema. Gait and ROM are intact. No gross neurologic deficits noted.  LABORATORY DATA: Lab Results  Component Value Date   WBC 10.0 05/09/2013   HGB 15.4* 05/09/2013   HCT 43.9 05/09/2013  PLT 167 05/09/2013   GLUCOSE 100* 05/09/2013   NA 136 05/09/2013   K 4.2 05/09/2013   CL 108 05/09/2013   CREATININE 0.78 05/09/2013   BUN 17 05/09/2013   CO2 20 05/09/2013   INR 1.1* 05/02/2013   Echo Study Conclusions  - Left ventricle: Systolic function was normal. The estimated ejection fraction was in the range of 50% to 55%. Hypokinesis of the distalanteroseptal myocardium. - Aortic valve: No evidence of vegetation. - Mitral valve: No evidence of vegetation. - Right ventricle: The cavity size was moderately dilated. Systolic function was mildly reduced. - Right atrium: The atrium was moderately dilated. - Atrial septum: No defect or patent foramen ovale was identified. There was an atrial septal aneurysm. - Tricuspid valve: Mobility of the septal leaflet was restricted. No evidence of vegetation. Wide-open regurgitation. - Pulmonic valve: No evidence of vegetation.  Angiographic Findings:  Left main: No obstructive disease.  Left Anterior Descending Artery:Large caliber vessel that courses to the apex. Several small caliber diagonal branches. No obstructive disease.  Circumflex Artery: Moderate caliber vessel with early obtuse marginal branch. No obstructive disease.  Right Coronary Artery: Large dominant vessel with no obstructive disease.  Left Ventricular Angiogram: LVEF=45% with global hypokinesis.  Impression:  1. No angiographic evidence of CAD  2. Mild global LV systolic dysfunction  3. Known severe tricuspid valve regurgitation    Recommendations: Will review findings of TEE this am and plan referral to see Dr. Cornelius Fitzgerald in [place] surgery office for evaluation of her tricuspid valve. Dr. Cornelius Fitzgerald performed her tricuspid valve procedure in 2007.  Complications: None; patient tolerated the procedure well.    Assessment / Plan: 1. Severe TR - I have called [facility] - will get her back to see Dr. Cornelius Fitzgerald for further disposition for her situation. For now, no change in current therapy.  2. Tobacco abuse - cessation is encouraged however I do not feel she is going to be able to stop given her anxieties.   Patient is agreeable to this plan and will call if any problems develop in the interim.   Rosalio Macadamia, RN, ANP-C [facility] [address] [place], [place]  [address]

## 2013-06-13 ENCOUNTER — Ambulatory Visit (INDEPENDENT_AMBULATORY_CARE_PROVIDER_SITE_OTHER): Payer: Medicare Other | Admitting: Thoracic Surgery (Cardiothoracic Vascular Surgery)

## 2013-06-13 ENCOUNTER — Encounter: Payer: Self-pay | Admitting: Thoracic Surgery (Cardiothoracic Vascular Surgery)

## 2013-06-13 ENCOUNTER — Ambulatory Visit: Payer: Medicare Other | Admitting: Thoracic Surgery (Cardiothoracic Vascular Surgery)

## 2013-06-13 VITALS — BP 117/75 | HR 84 | Resp 20 | Ht 65.5 in | Wt 169.0 lb

## 2013-06-13 DIAGNOSIS — I079 Rheumatic tricuspid valve disease, unspecified: Secondary | ICD-10-CM

## 2013-06-13 DIAGNOSIS — I071 Rheumatic tricuspid insufficiency: Secondary | ICD-10-CM

## 2013-06-13 NOTE — Progress Notes (Signed)
[address]       [name] [address]             [phone]     CARDIOTHORACIC SURGERY OFFICE NOTE  Referring Provider is Verne Carrow D*MD PCP is Marin Comment, FNP   HPI:  Patient returns for followup of severe tricuspid regurgitation. She was seen in consultation on 05/24/2013. Since then she underwent pulmonary function tests and dental consultation with dental cleaning. She was seen in follow up by Marin Comment in [place] who has treated her in the past for recurrent skin infections in the perineal region.  She did not have a pelvic exam performed at this last visit, but she has been told that the skin infections were caused by shaving and given a prescription for oral Keflex.  She has also been seen in followup by Norma Fredrickson at Dr. Gibson Ramp office earlier this week.  She returns to our office with her husband for further followup today.  The patient has continued to have significant problems with anxiety. In fact, earlier today she called to cancel her appointment but then called back later to reschedule it. She freely admits that she is struggling with anxiety and depression.  She continues to complain of exertional shortness of breath and vague atypical chest pain. She states that she doesn't have much pain but she does feels as though something is wrong. She continues to smoke although she has cut back some and she has started using e-cigarettes.   Current Outpatient Prescriptions  Medication Sig Dispense Refill  . cephALEXin (KEFLEX) 500 MG capsule Take four (4) capsules one hour prior dental appointment.  4 capsule  1  . chlorhexidine (PERIDEX) 0.12 % solution Rinse with 15 mls twice daily for 30 seconds. Use after breakfast and at bedtime. Spit out excess. Do not swallow.  480 mL  6  . famotidine (PEPCID) 20 MG tablet Take 20 mg by mouth daily.      . hydrOXYzine (ATARAX/VISTARIL) 25 MG tablet Take 25 mg by mouth at bedtime as needed. For  sleep      . ibuprofen (ADVIL,MOTRIN) 200 MG tablet Take 200 mg by mouth every 6 (six) hours as needed for pain.      . Multiple Vitamins-Calcium (ONE-A-DAY WOMENS FORMULA PO) Take 1 tablet by mouth daily.      . naproxen (NAPROSYN) 500 MG tablet Take 500 mg by mouth 2 (two) times daily with a meal. Only takes during menstrual cycle      . omega-3 acid ethyl esters (LOVAZA) 1 G capsule Take 1 g by mouth daily.      . potassium gluconate 595 MG TABS Take 595 mg by mouth every other day.      Marland Kitchen PROAIR HFA 108 (90 BASE) MCG/ACT inhaler Inhale 2 puffs into the lungs every 6 (six) hours as needed for wheezing or shortness of breath.       . sertraline (ZOLOFT) 50 MG tablet Take 50 mg by mouth daily.       Marland Kitchen zinc gluconate 50 MG tablet Take 50 mg by mouth every 7 (seven) days.       No current facility-administered medications for this visit.      Physical Exam:   BP 117/75  Pulse 84  Resp 20  Ht 5' 5.5" (1.664 m)  Wt 169 lb (76.658 kg)  BMI 27.69 kg/m2  SpO2 97%  LMP 05/13/2013  General:  Anxious and visably upset  Chest:  Scattered rhonchi  CV:   RRR w/ systolic murmur  Incisions:  n/a  Abdomen:  soft  Extremities:  warm   Diagnostic Tests:  Pulmonary Function Tests  Baseline      Post-bronchodilator  FVC  2.92 L  (77% predicted) FVC  3.47 L  (91% predicted) FEV1  1.47 L  (47% predicted) FEV1  1.99 L  (64% predicted) FEF25-75 0.56 L  (18% predicted) FEF25-75 1.27 L  (41% predicted)  RV  2.83 L  (168% predicted) DLCO  54% predicted    Impression:  The patient has severe tricuspid regurgitation related to previous surgical debridement of the valve for management of 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 Recent followup echocardiogram demonstrates severe right ventricular chamber enlargement, perhaps with some secondary effects on left ventricular function, although this seemed less impressive on TEE. The patient does not have  coronary artery disease and right heart pressures are essentially normal. The patient has some symptoms of exertional shortness of breath and atypical chest pain that may or may not be related to her underlying tricuspid regurgitation and right heart dysfunction, but overall she does not seem to have any symptoms of right-sided heart failure at this time.     The patient has many other ongoing problems with likely contribute to and/or potentially completely explain her problems with shortness of breath and atypical chest pain. Pulmonary function testing reveals findings suggestive of severe airway obstruction and COPD. She might benefit from formal pulmonary medicine consultation to further adjust medical therapy and hopefully assist her with finding a way to quit smoking.  The patient clearly continues to struggle with anxiety and/or depression. She may have more significant underlying psychological problems as well. She might benefit from formal psychological evaluation.  I favor continued medical therapy for the patient's tricuspid regurgitation with close followup for the time being. I am reluctant to consider elective tricuspid valve repair or replacement at this time.   Plan:  I've discussed with the patient and her husband the results of her pulmonary function tests and how important it is for her to find a way to quit smoking.  I've offered to refer her for formal pulmonary medicine consultation to assist with medical therapy for COPD and tobacco abuse. I've also offered to refer the patient to the [facility] for formal psychological evaluation. I've reminded recommended that she returns to see Dr. Clifton James for repeat echocardiogram in 3 months.  We will plan to see her back in 6 months to see how she is getting along.    Salvatore Decent. Cornelius Moras, MD 06/13/2013 3:52 PM

## 2013-06-13 NOTE — Patient Instructions (Signed)
The patient should make every effort to stop smoking immediately and permanently.  

## 2013-06-15 ENCOUNTER — Other Ambulatory Visit: Payer: Self-pay | Admitting: *Deleted

## 2013-06-15 DIAGNOSIS — J449 Chronic obstructive pulmonary disease, unspecified: Secondary | ICD-10-CM

## 2013-06-15 DIAGNOSIS — R0602 Shortness of breath: Secondary | ICD-10-CM

## 2013-06-18 ENCOUNTER — Institutional Professional Consult (permissible substitution): Payer: Self-pay | Admitting: Internal Medicine

## 2013-06-20 ENCOUNTER — Encounter: Payer: Self-pay | Admitting: Thoracic Surgery (Cardiothoracic Vascular Surgery)

## 2013-06-21 ENCOUNTER — Ambulatory Visit: Payer: Medicare Other | Admitting: Cardiothoracic Surgery

## 2013-07-11 ENCOUNTER — Encounter: Payer: Self-pay | Admitting: Cardiovascular Disease

## 2013-07-12 ENCOUNTER — Telehealth: Payer: Self-pay | Admitting: Cardiovascular Disease

## 2013-07-12 ENCOUNTER — Encounter: Payer: Self-pay | Admitting: Cardiovascular Disease

## 2013-07-12 DIAGNOSIS — I38 Endocarditis, valve unspecified: Secondary | ICD-10-CM

## 2013-07-12 DIAGNOSIS — K0530 Chronic periodontitis, unspecified: Secondary | ICD-10-CM

## 2013-07-12 MED ORDER — CEPHALEXIN 500 MG PO CAPS: ORAL_CAPSULE | ORAL | Status: DC

## 2013-07-12 NOTE — Telephone Encounter (Signed)
Done

## 2013-07-12 NOTE — Telephone Encounter (Signed)
Routed to Dr. Letta Moynahan, RN.

## 2013-07-12 NOTE — Telephone Encounter (Signed)
Spoke with pt and told her letter had been written and I would fax to Dr. Lorin Picket.  She is aware of need for antibiotics prior to dental work. She has taken Keflex in past. Will send refills to [facility] in [place]

## 2013-07-12 NOTE — Telephone Encounter (Signed)
New message:  Pt is going to have dental work done and the dentist needs to get a letter stating any problems or contradictions she might have.  Please fax letter to [phone], Dr. Geryl Councilman.  Appt. Is pending this letter.  Going to have 2 teeth implanted.

## 2013-08-01 ENCOUNTER — Encounter: Payer: Self-pay | Admitting: Cardiovascular Disease

## 2013-08-01 ENCOUNTER — Other Ambulatory Visit (HOSPITAL_COMMUNITY): Payer: Self-pay | Admitting: Cardiovascular Disease

## 2013-08-01 ENCOUNTER — Ambulatory Visit (HOSPITAL_COMMUNITY): Payer: Medicare Other | Attending: Cardiovascular Disease | Admitting: Radiology

## 2013-08-01 ENCOUNTER — Ambulatory Visit (INDEPENDENT_AMBULATORY_CARE_PROVIDER_SITE_OTHER): Payer: Medicare Other | Admitting: Cardiovascular Disease

## 2013-08-01 VITALS — BP 122/73 | HR 57 | Ht 65.0 in | Wt 168.0 lb

## 2013-08-01 DIAGNOSIS — I369 Nonrheumatic tricuspid valve disorder, unspecified: Secondary | ICD-10-CM

## 2013-08-01 DIAGNOSIS — I071 Rheumatic tricuspid insufficiency: Secondary | ICD-10-CM

## 2013-08-01 DIAGNOSIS — R0602 Shortness of breath: Secondary | ICD-10-CM

## 2013-08-01 DIAGNOSIS — I079 Rheumatic tricuspid valve disease, unspecified: Secondary | ICD-10-CM | POA: Insufficient documentation

## 2013-08-01 DIAGNOSIS — J4489 Other specified chronic obstructive pulmonary disease: Secondary | ICD-10-CM | POA: Insufficient documentation

## 2013-08-01 DIAGNOSIS — Z8674 Personal history of sudden cardiac arrest: Secondary | ICD-10-CM | POA: Insufficient documentation

## 2013-08-01 DIAGNOSIS — F17200 Nicotine dependence, unspecified, uncomplicated: Secondary | ICD-10-CM | POA: Insufficient documentation

## 2013-08-01 DIAGNOSIS — Z720 Tobacco use: Secondary | ICD-10-CM

## 2013-08-01 DIAGNOSIS — J449 Chronic obstructive pulmonary disease, unspecified: Secondary | ICD-10-CM

## 2013-08-01 NOTE — Progress Notes (Signed)
History of Present Illness: 42 yo female with history of TV endocarditis, former polysubstance abuse, Hepatitis B and C, PFO s/p closure here today for cardiac follow up. I saw her 04/16/13 to re-establish cardiology care. She had been followed in the past in our [place] office by Dr. Freida Busman. 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 At the visit in our office 04/16/13, she c/o dizziness, SOB, fatigue, flu-like symptoms.  She smokes 1 ppd. No chest pain. She had been using a friends supplemental O2 at times because it relaxes her and helps her sleep. I arranged an echo 04/23/13 which showed LVEF=45%, dilated RV and RA, severe TR. Cardiac cath 05/08/13 with no evidence of CAD. TEE confirmed severe TR. PFTS with severe COPD. She continues to smoke.   She is here today for follow up. She feels great. Mild SOB but much improved. No LE edema. No chest pain. She is much more relaxed. Anxiety level has been lower. She has not followed up in Pulmonary clinic as recommended when she was seen by Dr. Cornelius Moras.   Primary Care Physician: Dr. Anner Crete [facility])  Past Medical History  Diagnosis Date  . Endocarditis     a. 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  Marland Kitchen PFO (patent foramen ovale)     a. s/p closure in 2007.  Marland Kitchen Hepatitis B carrier   . Tobacco abuse   . Asthma   . Hepatitis C   . Cardiac tamponade     a. s/p subxiphoid window 2007 in setting of TV endocarditis. b. She had suffered a mild anoxic injury during her recent hospitalization felt to be related to hypoperfusion [name]  06/15/2006 (05/09/2013)  . Cardiac arrest     a. 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  . Memory loss, short term     "since OHS in 2007" (05/09/2013)  . GERD (gastroesophageal reflux disease)   . Arthritis     "knees" (05/09/2013)  . Anxiety   . Depression   . ARF (acute renal failure)     a. 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  . Septic pulmonary embolism     a. During admission for TV endocarditis.  . Severe tricuspid regurgitation     a. By TEE/cath 04/2013 - for outpt referral to [facility].     Past Surgical History  Procedure Laterality Date  . Tricuspid valve surgery  04/22/2006    debridement of septal leaflet for MSSA bacterial endocarditis with closure PFO  . Patent foramen ovale closure  04/22/2006    closed during tricuspid valve surgery  . Cardiac catheterization      multiple/notes 06/15/2006 (05/09/2013)  . Subxyphoid pericardial window  05/07/2006    for large postoperative pericardial effusion   . Dilation and curettage of uterus      "couple of those years ago; one a couple years ago after I lost a pregnancy" (05/09/2013)  . Tee without cardioversion N/A 05/08/2013    Procedure: TRANSESOPHAGEAL ECHOCARDIOGRAM (TEE);  Surgeon: Lewayne Bunting, MD;  Location: [facility] ENDOSCOPY;  Service: Cardiovascular;  Laterality: N/A;    Current  Outpatient Prescriptions  Medication Sig Dispense Refill  . cephALEXin (KEFLEX) 500 MG capsule Take four (4) capsules one hour prior dental appointment.  4 capsule  3  . chlorhexidine (PERIDEX) 0.12 % solution Rinse with 15 mls twice daily for 30 seconds. Use after breakfast and at bedtime. Spit out excess. Do not swallow.  480 mL  6  . ibuprofen (ADVIL,MOTRIN) 200 MG tablet Take 200 mg by mouth every 6 (six) hours as needed for pain.      . Multiple Vitamins-Calcium (ONE-A-DAY WOMENS FORMULA PO) Take 1 tablet by mouth daily.      . naproxen (NAPROSYN) 500 MG tablet Take 500 mg by mouth  2 (two) times daily with a meal. Only takes during menstrual cycle      . omega-3 acid ethyl esters (LOVAZA) 1 G capsule Take 1 g by mouth daily.      . potassium gluconate 595 MG TABS Take 595 mg by mouth every other day.      Marland Kitchen PROAIR HFA 108 (90 BASE) MCG/ACT inhaler Inhale 2 puffs into the lungs every 6 (six) hours as needed for wheezing or shortness of breath.       . sertraline (ZOLOFT) 50 MG tablet Take 50 mg by mouth daily.       Marland Kitchen zinc gluconate 50 MG tablet Take 50 mg by mouth every 7 (seven) days.       No current facility-administered medications for this visit.    Allergies  Allergen Reactions  . Amoxicillin Other (See Comments)    " I get very sick with this medication"  . Latex Rash    " I'm allergic to latex, and it breaks by skin out in an itchy rash"    History   Social History  . Marital Status: Married    Spouse Name: N/A    Number of Children: 6  . Years of Education: N/A   Occupational History  . disabled    Social History Main Topics  . Smoking status: Current Every Day Smoker -- 0.75 packs/day for 30 years    Types: Cigarettes  . Smokeless tobacco: Never Used  . Alcohol Use: No  . Drug Use: Yes    Special: Marijuana, Heroin, "Crack" cocaine, Cocaine     Comment: 05/09/2013 "<1 joint/day; last heroin & crack was before heart OR in 2007"  . Sexual Activity: Yes   Other Topics Concern  . Not on file   Social History Narrative  . No narrative on file    Family History  Problem Relation Age of Onset  . Hyperlipidemia Mother   . Hyperlipidemia Brother   . Heart disease Maternal Grandmother     Review of Systems:  As stated in the HPI and otherwise negative.   BP 122/73  Pulse 57  Ht 5\' 5"  (1.651 m)  Wt 168 lb (76.204 kg)  BMI 27.96 kg/m2  Physical Examination: General: Well developed, well nourished, NAD HEENT: OP clear, mucus membranes moist SKIN: warm, dry. No rashes. Neuro: No focal deficits Musculoskeletal: Muscle strength 5/5 all  ext Psychiatric: Mood and affect normal Neck: No JVD, no carotid bruits, no thyromegaly, no lymphadenopathy. Lungs:Clear bilaterally, no wheezes, rhonci, crackles Cardiovascular: Regular rate and rhythm with systolic murmur. No gallops or rubs. Abdomen:Soft. Bowel sounds present. Non-tender.  Extremities: No lower extremity edema. Pulses are 2 + in the bilateral DP/PT.  Echo 04/23/13: Left ventricle: The cavity size was normal. Wall thickness was normal. The estimated ejection fraction was 45%. Diffuse  hypokinesis. - Right ventricle: The cavity size was severely dilated. - Right atrium: The atrium was moderately dilated. - Atrial septum: No defect or patent foramen ovale was identified. - Tricuspid valve: Etiology of the severe TR is not apparent Severe regurgitation.  Cardiac cath 05/08/13:  Hemodynamic Findings:  Ao: 152/87  LV: 153/5/12  RA: 12  RV: 29/8/12  PA: 26/5 (mean 16)  PCWP: 8  Fick Cardiac Output: 3.86 L/min  Fick Cardiac Index: 2.08 L/min/m2  Central Aortic Saturation: 96%  Pulmonary Artery Saturation: 66%  Angiographic Findings:  Left main: No obstructive disease.  Left Anterior Descending Artery:Large caliber vessel that courses to the apex. Several small caliber diagonal branches. No obstructive disease.  Circumflex Artery: Moderate caliber vessel with early obtuse marginal branch. No obstructive disease.  Right Coronary Artery: Large dominant vessel with no obstructive disease.  Left Ventricular Angiogram: LVEF=45% with global hypokinesis.  Impression:  1. No angiographic evidence of CAD  2. Mild global LV systolic dysfunction  3. Known severe tricuspid valve regurgitation  Recommendations: Will review findings of TEE this am and plan referral to see Dr. Cornelius Moras in [place] surgery office for evaluation of her tricuspid valve. Dr. Cornelius Moras performed her tricuspid valve procedure in 2007.   TEE 05/08/13:  Left ventricle: Systolic function was normal. The estimated ejection  fraction was in the range of 50% to 55%. Hypokinesis of the distalanteroseptal myocardium. - Aortic valve: No evidence of vegetation. - Mitral valve: No evidence of vegetation. - Right ventricle: The cavity size was moderately dilated. Systolic function was mildly reduced. - Right atrium: The atrium was moderately dilated. - Atrial septum: No defect or patent foramen ovale was identified. There was an atrial septal aneurysm. - Tricuspid valve: Mobility of the septal leaflet was restricted. No evidence of vegetation. Wide-open regurgitation. - Pulmonic valve: No evidence of vegetation.  Assessment and Plan:   1. Tricuspid Valve regurgitation: Severe by echo 04/23/13 and confirmed by TEE 05/08/13. Cardiac cath with no CAD. She has been evaluated by Dr. Cornelius Moras with CT surgery. Since she has no signs of right sided heart failure, no indication for tricuspid valve repair at this time. She is doing well. Will continue to follow.   2. COPD: We have discussed referral to Pulmonary clinic but she refuses.   3. Tobacco abuse: Smoking cessation recommended. She wishes to stop.

## 2013-08-01 NOTE — Patient Instructions (Addendum)
Your physician wants you to follow-up in:  6 months. You will receive a reminder letter in the mail two months in advance. If you don't receive a letter, please call our office to schedule the follow-up appointment.   

## 2013-08-01 NOTE — Progress Notes (Signed)
Echocardiogram performed.  

## 2013-08-27 ENCOUNTER — Encounter: Payer: Self-pay | Admitting: Cardiovascular Disease

## 2013-08-28 ENCOUNTER — Other Ambulatory Visit: Payer: Self-pay

## 2013-08-28 MED ORDER — ALBUTEROL SULFATE HFA 108 (90 BASE) MCG/ACT IN AERS: 2.0000 | INHALATION_SPRAY | Freq: Four times a day (QID) | RESPIRATORY_TRACT | Status: DC | PRN

## 2013-08-28 MED ORDER — SERTRALINE HCL 50 MG PO TABS: 50.0000 mg | ORAL_TABLET | Freq: Every day | ORAL | Status: DC

## 2013-09-05 ENCOUNTER — Other Ambulatory Visit: Payer: Self-pay | Admitting: *Deleted

## 2013-09-05 ENCOUNTER — Encounter: Payer: Self-pay | Admitting: Cardiovascular Disease

## 2013-09-05 DIAGNOSIS — I079 Rheumatic tricuspid valve disease, unspecified: Secondary | ICD-10-CM

## 2013-09-05 MED ORDER — FISH OIL 1000 MG PO CAPS: 1.0000 | ORAL_CAPSULE | Freq: Every day | ORAL | Status: DC

## 2013-09-06 ENCOUNTER — Other Ambulatory Visit: Payer: Self-pay

## 2013-10-02 ENCOUNTER — Other Ambulatory Visit (HOSPITAL_COMMUNITY): Payer: Self-pay | Admitting: Dentistry

## 2013-10-02 DIAGNOSIS — I079 Rheumatic tricuspid valve disease, unspecified: Secondary | ICD-10-CM

## 2013-10-02 DIAGNOSIS — Z0189 Encounter for other specified special examinations: Secondary | ICD-10-CM

## 2013-10-02 DIAGNOSIS — I38 Endocarditis, valve unspecified: Secondary | ICD-10-CM

## 2013-10-02 MED ORDER — CHLORHEXIDINE GLUCONATE 0.12 % MT SOLN: OROMUCOSAL | Status: DC

## 2013-10-04 ENCOUNTER — Encounter: Payer: Self-pay | Admitting: Cardiovascular Disease

## 2013-11-07 ENCOUNTER — Encounter: Payer: Self-pay | Admitting: Cardiovascular Disease

## 2013-11-12 ENCOUNTER — Encounter: Payer: Self-pay | Admitting: Cardiovascular Disease

## 2013-11-15 ENCOUNTER — Telehealth: Payer: Self-pay | Admitting: Cardiovascular Disease

## 2013-11-15 NOTE — Telephone Encounter (Signed)
Spoke with pt's husband who reports pt thinks she may be pregnant but this has not been confirmed. He states pt's primary care doctor previously referred pt to [place]N doctor in [place] and [facility] do not [facility] [place] [facility] [place] [facility].  I asked husband to contact primary care for follow up on this and request referral to doctor in [place] if that is where he would like pt to be seen.  Husband agreeable with this plan.

## 2013-11-15 NOTE — Telephone Encounter (Signed)
New message  Patient / husband think she pregnant.    Need a referral to [facility]

## 2013-11-19 ENCOUNTER — Other Ambulatory Visit: Payer: Self-pay | Admitting: Cardiovascular Disease

## 2013-11-26 ENCOUNTER — Telehealth: Payer: Self-pay | Admitting: Cardiovascular Disease

## 2013-11-26 NOTE — Telephone Encounter (Signed)
Spoke with pt and husband who report they saw in pt's my chart medical history that pt is a Hepatitis B carrier. They are asking when this diagnosis was made. I told them this was not a recent addition to chart and I do not know when diagnosis was made.

## 2013-11-26 NOTE — Telephone Encounter (Signed)
New problem   Pt has question concerning her mychart that states she is a Hepatitis B carrier. She need to speak to someone concerning this note. Please call pt

## 2013-11-27 ENCOUNTER — Other Ambulatory Visit: Payer: Self-pay | Admitting: Cardiovascular Disease

## 2013-11-27 NOTE — Telephone Encounter (Signed)
Please send to PCP

## 2013-11-27 NOTE — Telephone Encounter (Signed)
Should this be sent to pcp or will Dr. Clifton JamesMcAlhany refill again? Thanks, MI

## 2013-12-19 ENCOUNTER — Telehealth: Payer: Self-pay | Admitting: Cardiovascular Disease

## 2013-12-19 NOTE — Telephone Encounter (Signed)
Spoke with pt. She is asking who Dr. Clifton JamesMcAlhany would recommend for Primary Care and gynecology. Also requesting referral to these doctors. [place]

## 2013-12-19 NOTE — Telephone Encounter (Signed)
New message     Talk to the nurse to get referral to see doctors in [place].  She has separated from her husband.

## 2013-12-20 NOTE — Telephone Encounter (Signed)
Reviewed with Dr. Clifton JamesMcAlhany who recommends any [place] primary care--whichever location is most convenient for pt.  He would like primary care to recommend and make referral for gynecologist. Pt does not need referral for primary care. I spoke with pt and gave her this information. She would like to go to [place] location and I gave her the phone number for this office.  She states Zoloft has been increased to 100 mg daily and is asking if Dr. Clifton JamesMcAlhany will refill.  I told her she would need to contact provider who prescribed this for refills.

## 2014-01-16 ENCOUNTER — Other Ambulatory Visit (INDEPENDENT_AMBULATORY_CARE_PROVIDER_SITE_OTHER): Payer: Medicare Other

## 2014-01-16 ENCOUNTER — Encounter: Payer: Self-pay | Admitting: Physician Assistant

## 2014-01-16 ENCOUNTER — Ambulatory Visit (INDEPENDENT_AMBULATORY_CARE_PROVIDER_SITE_OTHER): Payer: Medicare Other | Admitting: Physician Assistant

## 2014-01-16 VITALS — BP 122/82 | HR 80 | Temp 97.8°F | Ht 65.5 in | Wt 155.4 lb

## 2014-01-16 DIAGNOSIS — B181 Chronic viral hepatitis B without delta-agent: Secondary | ICD-10-CM

## 2014-01-16 DIAGNOSIS — Z0000 Encounter for general adult medical examination without abnormal findings: Secondary | ICD-10-CM

## 2014-01-16 DIAGNOSIS — F3289 Other specified depressive episodes: Secondary | ICD-10-CM

## 2014-01-16 DIAGNOSIS — F329 Major depressive disorder, single episode, unspecified: Secondary | ICD-10-CM | POA: Insufficient documentation

## 2014-01-16 DIAGNOSIS — F32A Depression, unspecified: Secondary | ICD-10-CM

## 2014-01-16 DIAGNOSIS — E785 Hyperlipidemia, unspecified: Secondary | ICD-10-CM

## 2014-01-16 DIAGNOSIS — J449 Chronic obstructive pulmonary disease, unspecified: Secondary | ICD-10-CM

## 2014-01-16 DIAGNOSIS — Z23 Encounter for immunization: Secondary | ICD-10-CM

## 2014-01-16 DIAGNOSIS — F419 Anxiety disorder, unspecified: Secondary | ICD-10-CM

## 2014-01-16 DIAGNOSIS — K219 Gastro-esophageal reflux disease without esophagitis: Secondary | ICD-10-CM

## 2014-01-16 DIAGNOSIS — B1920 Unspecified viral hepatitis C without hepatic coma: Secondary | ICD-10-CM

## 2014-01-16 DIAGNOSIS — F411 Generalized anxiety disorder: Secondary | ICD-10-CM

## 2014-01-16 DIAGNOSIS — M1990 Unspecified osteoarthritis, unspecified site: Secondary | ICD-10-CM

## 2014-01-16 DIAGNOSIS — M129 Arthropathy, unspecified: Secondary | ICD-10-CM

## 2014-01-16 LAB — HEPATIC FUNCTION PANEL
ALBUMIN: 4.5 g/dL (ref 3.5–5.2)
ALT: 18 U/L (ref 0–35)
AST: 18 U/L (ref 0–37)
Alkaline Phosphatase: 46 U/L (ref 39–117)
BILIRUBIN DIRECT: 0.2 mg/dL (ref 0.0–0.3)
Total Bilirubin: 1.5 mg/dL — ABNORMAL HIGH (ref 0.3–1.2)
Total Protein: 7.4 g/dL (ref 6.0–8.3)

## 2014-01-16 LAB — LIPID PANEL
CHOL/HDL RATIO: 3
Cholesterol: 164 mg/dL (ref 0–200)
HDL: 47.3 mg/dL (ref >39.00)
LDL Cholesterol: 99 mg/dL (ref 0–99)
Triglycerides: 90 mg/dL (ref 0.0–149.0)
VLDL: 18 mg/dL (ref 0.0–40.0)

## 2014-01-16 LAB — BASIC METABOLIC PANEL
BUN: 16 mg/dL (ref 6–23)
CHLORIDE: 105 meq/L (ref 96–112)
CO2: 29 mEq/L (ref 19–32)
Calcium: 9.8 mg/dL (ref 8.4–10.5)
Creatinine, Ser: 0.8 mg/dL (ref 0.4–1.2)
GFR: 80.88 mL/min (ref >60.00)
Glucose, Bld: 107 mg/dL — ABNORMAL HIGH (ref 70–99)
POTASSIUM: 5.6 meq/L — AB (ref 3.5–5.1)
Sodium: 139 mEq/L (ref 135–145)

## 2014-01-16 LAB — CBC WITH DIFFERENTIAL/PLATELET
BASOS ABS: 0.1 10*3/uL (ref 0.0–0.1)
Basophils Relative: 1.3 % (ref 0.0–3.0)
EOS ABS: 0.2 10*3/uL (ref 0.0–0.7)
Eosinophils Relative: 1.5 % (ref 0.0–5.0)
HCT: 47.5 % — ABNORMAL HIGH (ref 36.0–46.0)
Hemoglobin: 16.1 g/dL — ABNORMAL HIGH (ref 12.0–15.0)
Lymphocytes Relative: 29.5 % (ref 12.0–46.0)
Lymphs Abs: 3.2 10*3/uL (ref 0.7–4.0)
MCHC: 34 g/dL (ref 30.0–36.0)
MCV: 95.3 fl (ref 78.0–100.0)
MONOS PCT: 6.8 % (ref 3.0–12.0)
Monocytes Absolute: 0.7 10*3/uL (ref 0.1–1.0)
NEUTROS ABS: 6.6 10*3/uL (ref 1.4–7.7)
NEUTROS PCT: 60.9 % (ref 43.0–77.0)
PLATELETS: 199 10*3/uL (ref 150.0–400.0)
RBC: 4.98 Mil/uL (ref 3.87–5.11)
RDW: 13.8 % (ref 11.5–14.6)
WBC: 10.8 10*3/uL — ABNORMAL HIGH (ref 4.5–10.5)

## 2014-01-16 LAB — TSH: TSH: 0.82 u[IU]/mL (ref 0.35–5.50)

## 2014-01-16 LAB — URINALYSIS, ROUTINE W REFLEX MICROSCOPIC
Bilirubin Urine: NEGATIVE
KETONES UR: NEGATIVE
LEUKOCYTES UA: NEGATIVE
NITRITE: POSITIVE — AB
Total Protein, Urine: NEGATIVE
UROBILINOGEN UA: 0.2 (ref 0.0–1.0)
Urine Glucose: NEGATIVE
pH: 6 (ref 5.0–8.0)

## 2014-01-16 MED ORDER — SERTRALINE HCL 100 MG PO TABS: 100.0000 mg | ORAL_TABLET | Freq: Every day | ORAL | Status: DC

## 2014-01-16 MED ORDER — NAPROXEN 500 MG PO TABS: 500.0000 mg | ORAL_TABLET | Freq: Two times a day (BID) | ORAL | Status: DC

## 2014-01-16 NOTE — Patient Instructions (Addendum)
It was great to meet you today Ms. Tina Fitzgerald!  Labs have been ordered for you, when you report to lab please be fasting.     Diet for Gastroesophageal Reflux Disease, Adult Reflux is when stomach acid flows up into the esophagus. The esophagus becomes irritated and sore (inflammation). When reflux happens often and is severe, it is called gastroesophageal reflux disease (GERD). What you eat can help ease any discomfort caused by GERD. FOODS OR DRINKS TO AVOID OR LIMIT  Coffee and black tea, with or without caffeine.  Bubbly (carbonated) drinks with caffeine or energy drinks.  Strong spices, such as pepper, cayenne pepper, curry, or chili powder.  Peppermint or spearmint.  Chocolate.  High-fat foods, such as meats, fried food, oils, butter, or nuts.  Fruits and vegetables that cause discomfort. This includes citrus fruits and tomatoes.  Alcohol. If a certain food or drink irritates your GERD, avoid eating or drinking it. THINGS THAT MAY HELP GERD INCLUDE:  Eat meals slowly.  Eat 5 to 6 small meals a day, not 3 large meals.  Do not eat food for a certain amount of time if it causes discomfort.  Wait 3 hours after eating before lying down.  Keep the head of your bed raised 6 to 9 inches (15 23 centimeters). Put a foam wedge or blocks under the legs of the bed.  Stay active. Weight loss, if needed, may help ease your discomfort.  Wear loose-fitting clothing.  Do not smoke or chew tobacco. Document Released: 04/18/2012 Document Reviewed: 04/18/2012 [facility] Patient Information 2014 [place].     Health Maintenance, Female A healthy lifestyle and preventative care can promote health and wellness.  Maintain regular health, dental, and eye exams.  Eat a healthy diet. Foods like vegetables, fruits, whole grains, low-fat dairy products, and lean protein foods contain the nutrients you need without too many calories. Decrease your intake of foods high in solid fats,  added sugars, and salt. Get information about a proper diet from your caregiver, if necessary.  Regular physical exercise is one of the most important things you can do for your health. Most adults should get at least 150 minutes of moderate-intensity exercise (any activity that increases your heart rate and causes you to sweat) each week. In addition, most adults need muscle-strengthening exercises on 2 or more days a week.   Maintain a healthy weight. The body mass index (BMI) is a screening tool to identify possible weight problems. It provides an estimate of body fat based on height and weight. Your caregiver can help determine your BMI, and can help you achieve or maintain a healthy weight. For adults 20 years and older:  A BMI below 18.5 is considered underweight.  A BMI of 18.5 to 24.9 is normal.  A BMI of 25 to 29.9 is considered overweight.  A BMI of 30 and above is considered obese.  Maintain normal blood lipids and cholesterol by exercising and minimizing your intake of saturated fat. Eat a balanced diet with plenty of fruits and vegetables. Blood tests for lipids and cholesterol should begin at age 68 and be repeated every 5 years. If your lipid or cholesterol levels are high, you are over 50, or you are a high risk for heart disease, you may need your cholesterol levels checked more frequently.Ongoing high lipid and cholesterol levels should be treated with medicines if diet and exercise are not effective.  If you smoke, find out from your caregiver how to quit. If you do  not use tobacco, do not start.  Lung cancer screening is recommended for adults aged 2 80 years who are at high risk for developing lung cancer because of a history of smoking. Yearly low-dose computed tomography (CT) is recommended for people who have at least a 30-pack-year history of smoking and are a current smoker or have quit within the past 15 years. A pack year of smoking is smoking an average of 1 pack of  cigarettes a day for 1 year (for example: 1 pack a day for 30 years or 2 packs a day for 15 years). Yearly screening should continue until the smoker has stopped smoking for at least 15 years. Yearly screening should also be stopped for people who develop a health problem that would prevent them from having lung cancer treatment.  If you are pregnant, do not drink alcohol. If you are breastfeeding, be very cautious about drinking alcohol. If you are not pregnant and choose to drink alcohol, do not exceed 1 drink per day. One drink is considered to be 12 ounces (355 mL) of beer, 5 ounces (148 mL) of wine, or 1.5 ounces (44 mL) of liquor.  Avoid use of street drugs. Do not share needles with anyone. Ask for help if you need support or instructions about stopping the use of drugs.  High blood pressure causes heart disease and increases the risk of stroke. Blood pressure should be checked at least every 1 to 2 years. Ongoing high blood pressure should be treated with medicines, if weight loss and exercise are not effective.  If you are 17 to 43 years old, ask your caregiver if you should take aspirin to prevent strokes.  Diabetes screening involves taking a blood sample to check your fasting blood sugar level. This should be done once every 3 years, after age 69, if you are within normal weight and without risk factors for diabetes. Testing should be considered at a younger age or be carried out more frequently if you are overweight and have at least 1 risk factor for diabetes.  Breast cancer screening is essential preventative care for women. You should practice "breast self-awareness." This means understanding the normal appearance and feel of your breasts and may include breast self-examination. Any changes detected, no matter how small, should be reported to a caregiver. Women in their 1s and 30s should have a clinical breast exam (CBE) by a caregiver as part of a regular health exam every 1 to 3 years.  After age 21, women should have a CBE every year. Starting at age 5, women should consider having a mammogram (breast X-ray) every year. Women who have a family history of breast cancer should talk to their caregiver about genetic screening. Women at a high risk of breast cancer should talk to their caregiver about having an MRI and a mammogram every year.  Breast cancer gene (BRCA)-related cancer risk assessment is recommended for women who have family members with BRCA-related cancers. BRCA-related cancers include breast, ovarian, tubal, and peritoneal cancers. Having family members with these cancers may be associated with an increased risk for harmful changes (mutations) in the breast cancer genes BRCA1 and BRCA2. Results of the assessment will determine the need for genetic counseling and BRCA1 and BRCA2 testing.  The Pap test is a screening test for cervical cancer. Women should have a Pap test starting at age 25. Between ages 33 and 92, Pap tests should be repeated every 2 years. Beginning at age 45, you should have a  Pap test every 3 years as long as the past 3 Pap tests have been normal. If you had a hysterectomy for a problem that was not cancer or a condition that could lead to cancer, then you no longer need Pap tests. If you are between ages 62 and 66, and you have had normal Pap tests going back 10 years, you no longer need Pap tests. If you have had past treatment for cervical cancer or a condition that could lead to cancer, you need Pap tests and screening for cancer for at least 20 years after your treatment. If Pap tests have been discontinued, risk factors (such as a new sexual partner) need to be reassessed to determine if screening should be resumed. Some women have medical problems that increase the chance of getting cervical cancer. In these cases, your caregiver may recommend more frequent screening and Pap tests.  The human papillomavirus (HPV) test is an additional test that may be  used for cervical cancer screening. The HPV test looks for the virus that can cause the cell changes on the cervix. The cells collected during the Pap test can be tested for HPV. The HPV test could be used to screen women aged 85 years and older, and should be used in women of any age who have unclear Pap test results. After the age of 51, women should have HPV testing at the same frequency as a Pap test.  Colorectal cancer can be detected and often prevented. Most routine colorectal cancer screening begins at the age of 27 and continues through age 37. However, your caregiver may recommend screening at an earlier age if you have risk factors for colon cancer. On a yearly basis, your caregiver may provide home test kits to check for hidden blood in the stool. Use of a small camera at the end of a tube, to directly examine the colon (sigmoidoscopy or colonoscopy), can detect the earliest forms of colorectal cancer. Talk to your caregiver about this at age 89, when routine screening begins. Direct examination of the colon should be repeated every 5 to 10 years through age 21, unless early forms of pre-cancerous polyps or small growths are found.  Hepatitis C blood testing is recommended for all people born from [birth year] through 1965 and any individual with known risks for hepatitis C.  Practice safe sex. Use condoms and avoid high-risk sexual practices to reduce the spread of sexually transmitted infections (STIs). Sexually active women aged 31 and younger should be checked for Chlamydia, which is a common sexually transmitted infection. Older women with new or multiple partners should also be tested for Chlamydia. Testing for other STIs is recommended if you are sexually active and at increased risk.  Osteoporosis is a disease in which the bones lose minerals and strength with aging. This can result in serious bone fractures. The risk of osteoporosis can be identified using a bone density scan. Women ages 82 and  over and women at risk for fractures or osteoporosis should discuss screening with their caregivers. Ask your caregiver whether you should be taking a calcium supplement or vitamin D to reduce the rate of osteoporosis.  Menopause can be associated with physical symptoms and risks. Hormone replacement therapy is available to decrease symptoms and risks. You should talk to your caregiver about whether hormone replacement therapy is right for you.  Use sunscreen. Apply sunscreen liberally and repeatedly throughout the day. You should seek shade when your shadow is shorter than you. Protect yourself by  wearing long sleeves, pants, a wide-brimmed hat, and sunglasses year round, whenever you are outdoors.  Notify your caregiver of new moles or changes in moles, especially if there is a change in shape or color. Also notify your caregiver if a mole is larger than the size of a pencil eraser.  Stay current with your immunizations. Document Released: 05/03/2011 Document Revised: 02/12/2013 Document Reviewed: 05/03/2011 [facility] Patient Information 2014 [place].

## 2014-01-16 NOTE — Progress Notes (Signed)
Pre visit review using our clinic review tool, if applicable. No additional management support is needed unless otherwise documented below in the visit note. 

## 2014-01-16 NOTE — Progress Notes (Signed)
Patient ID: Tina Fitzgerald is a 43 y.o. female DOB: [phone] 27, 2072 MRN: [id number]     HPI:  Patient is a 43 year old female who presents to the office to establish care. Patient is accompanied by her boyfriend. Patient reports significant history including tricuspid valve disorder. Follows with [facility], Dr. Clifton JamesMcAlhany, also history of COPD treated with Albuterol. Patient reports she was evaluated a while ago and was recommended to go to Pulmonology for further evaluation however never went. States uses her Albuterol inhaler multiple times a day sometimes. History of GERD treated with Famotidine. Depression/anxiety treated with Zoloft. Reports is a carrier of Hepatitis B and has Hepatitis C. Was past drug user. Denies chest pain/palpitations, visual change/disturbances, change in bowel/bladder habits, N/V/F/C, cough, pain/difficulty swallowing, lightheaded, dizziness, weakness.   Influenza: 10/14 Tetanus: uncertain, close to 10 years ago if not over PAP: unknown LMP: 12/04/13 Mammogram: 2014 Colonoscopy: never   ROS: As stated in HPI. All other systems negative  Past Medical History  Diagnosis Date  . Endocarditis     a. 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  Marland Kitchen. PFO (patent foramen ovale)     a. s/p closure in 2007.  Marland Kitchen. Hepatitis B carrier   . Tobacco abuse   . Asthma   . Hepatitis C   . Cardiac tamponade     a. s/p subxiphoid window 2007 in setting of TV endocarditis. b. She had suffered a mild anoxic injury during her recent hospitalization felt to be related to hypoperfusion [name]/notes 06/15/2006 (05/09/2013)  . Cardiac arrest     a. 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    . Memory loss, short term     "since OHS in 2007" (05/09/2013)  . GERD (gastroesophageal reflux disease)   . Arthritis     "knees" (05/09/2013)  . Anxiety   . Depression   . ARF (acute renal failure)     a. 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  . Septic pulmonary embolism     a. During admission for TV endocarditis.  . Severe tricuspid regurgitation     a. By TEE/cath 04/2013 - for outpt referral to [facility].   Marland Kitchen. History of tricuspid valve disorder 04/16/2013  . Chronic headaches   . Hx: UTI (urinary tract infection)    Family History  Problem Relation Age of Onset  . Hyperlipidemia Mother   . Hyperlipidemia Brother   . Heart disease Maternal Grandmother   . Alcohol abuse Father   . Arthritis Father   . Diabetes Daughter   . Ovarian cancer Cousin   . Mental illness Other    History   Social History  . Marital Status: Married    Spouse Name: N/A    Number of Children: 6  . Years of Education: N/A   Occupational History  . disabled    Social History Main Topics  . Smoking status: Current Every Day Smoker -- 0.75 packs/day for 30 years    Types: Cigarettes  . Smokeless tobacco: Never Used  . Alcohol Use: No  . Drug Use: Yes    Special: Marijuana, Heroin, "Crack" cocaine, Cocaine     Comment: 05/09/2013 "<1 joint/day; last heroin & crack was before heart OR  in 2007"  . Sexual Activity: Yes   Other Topics Concern  . Not on file   Social History Narrative  . No narrative on file   Past Surgical History  Procedure Laterality Date  . Tricuspid valve surgery  04/22/2006    debridement of septal leaflet for MSSA bacterial endocarditis with closure PFO  . Patent foramen ovale closure  04/22/2006    closed during tricuspid valve surgery  . Cardiac catheterization      multiple/notes 06/15/2006 (05/09/2013)  . Subxyphoid pericardial window  05/07/2006    for large postoperative pericardial effusion   . Dilation and curettage of uterus       "couple of those years ago; one a couple years ago after I lost a pregnancy" (05/09/2013)  . Tee without cardioversion N/A 05/08/2013    Procedure: TRANSESOPHAGEAL ECHOCARDIOGRAM (TEE);  Surgeon: Lewayne Bunting, MD;  Location: [facility] ENDOSCOPY;  Service: Cardiovascular;  Laterality: N/A;   Current Outpatient Prescriptions on File Prior to Visit  Medication Sig Dispense Refill  . albuterol (PROAIR HFA) 108 (90 BASE) MCG/ACT inhaler Inhale 2 puffs into the lungs every 6 (six) hours as needed for wheezing or shortness of breath.  1 Inhaler  2  . ibuprofen (ADVIL,MOTRIN) 200 MG tablet Take 200 mg by mouth every 6 (six) hours as needed for pain.      . Multiple Vitamins-Calcium (ONE-A-DAY WOMENS FORMULA PO) Take 1 tablet by mouth daily.      . naproxen (NAPROSYN) 500 MG tablet Take 500 mg by mouth 2 (two) times daily with a meal. Only takes during menstrual cycle      . Omega-3 Fatty Acids (FISH OIL) 1000 MG CAPS Take 1 capsule (1,000 mg total) by mouth daily.  30 capsule  0  . potassium gluconate 595 MG TABS Take 595 mg by mouth every other day.      . zinc gluconate 50 MG tablet Take 50 mg by mouth every 7 (seven) days.       No current facility-administered medications on file prior to visit.   Allergies  Allergen Reactions  . Amoxicillin Other (See Comments)    " I get very sick with this medication"  . Latex Rash    " I'm allergic to latex, and it breaks by skin out in an itchy rash"    PE:  Filed Vitals:   01/16/14 1320  BP: 122/82  Pulse: 80  Temp: 97.8 F (36.6 C)    CONSTITUTIONAL: Well developed, well nourished, pleasant, appears stated age, in NAD HEENT: normocephalic, atraumatic, bilateral ext/int canals normal. Bilateral TM's without injections, bulging, erythema. Nose normal, uvula midline, oropharynx clear and moist. EYES: PERRLA, bilateral EOM and conjunctiva normal NECK: FROM, supple, without thyromegaly or mass CARDIO: RRR, normal S1 and S2, distal pulses  intact. PULM/CHEST CTA bilateral, no wheezes, rales or rhonchi. Non tender. ABD: appearance normal, soft, nontender. Normal bowel sounds x 4 quadrants, non palpable liver, spleen, kidney GU: deferred to GYN.  MUSC: FROM U/LE bilateral, FROM thoracic and lumbar spine, no midline tenderness or step offs noted LYMPH: no cervical, supraclavicular adenopathy NEURO: alert and oriented x 3, no cranial nerve deficit, DTR's intact, motor strength and coordination NL. Negative romberg. Gait normal. SKIN: warm, dry, no rash or lesions noted. PSYCH: Mood and affect normal, speech normal.   Lab Results  Component Value Date   WBC 10.0 05/09/2013   HGB 15.4* 05/09/2013   HCT 43.9 05/09/2013   PLT 167 05/09/2013   GLUCOSE 100*  05/09/2013   NA 136 05/09/2013   K 4.2 05/09/2013   CL 108 05/09/2013   CREATININE 0.78 05/09/2013   BUN 17 05/09/2013   CO2 20 05/09/2013   INR 1.1* 05/02/2013     ASSESSMENT and PLAN   CPX/v70.0 - Patient has been counseled on age-appropriate routine health concerns for screening and prevention. These are reviewed and up-to-date. Immunizations are up-to-date or declined. Labs ordered and will be reviewed.  At time of discharge patient mentioned within last two weeks she and her boyfriend experienced a condom failure and is concerned she could be pregnant. Pregnancy test ordered today.  HM: Referral to GYN Tdap updated.  GERD Continue with famotidine for symptom relief Provided counseling on symptom reduction including smoking cessation, decrease in alcohol, spicy/greasy foods, smaller meals, not eating within two hours of bedtime.  COPD Not well controlled with Albuterol Referral to Pulmonology for evaluation  Depression Continue with Zoloft  Smoker Patient not motivated to quit at this time.  Arthritis Treated with Aleve.  Continue with current medications. Will adjust as needed based on labs.

## 2014-01-17 LAB — PREGNANCY, URINE: PREG TEST UR: NEGATIVE

## 2014-01-21 ENCOUNTER — Institutional Professional Consult (permissible substitution): Payer: Self-pay | Admitting: Pulmonary Disease

## 2014-01-23 ENCOUNTER — Encounter: Payer: Self-pay | Admitting: Physician Assistant

## 2014-01-23 ENCOUNTER — Ambulatory Visit (INDEPENDENT_AMBULATORY_CARE_PROVIDER_SITE_OTHER): Payer: Medicare Other | Admitting: Cardiovascular Disease

## 2014-01-23 ENCOUNTER — Encounter: Payer: Self-pay | Admitting: Cardiovascular Disease

## 2014-01-23 VITALS — BP 130/92 | HR 89 | Ht 65.5 in | Wt 157.2 lb

## 2014-01-23 DIAGNOSIS — Z720 Tobacco use: Secondary | ICD-10-CM

## 2014-01-23 DIAGNOSIS — F17200 Nicotine dependence, unspecified, uncomplicated: Secondary | ICD-10-CM

## 2014-01-23 DIAGNOSIS — I079 Rheumatic tricuspid valve disease, unspecified: Secondary | ICD-10-CM

## 2014-01-23 DIAGNOSIS — I071 Rheumatic tricuspid insufficiency: Secondary | ICD-10-CM

## 2014-01-23 NOTE — Progress Notes (Signed)
History of Present Illness: 43 yo female with history of TV endocarditis, former polysubstance abuse, Hepatitis B and C, PFO s/p closure here today for cardiac follow up. I saw her 04/16/13 to re-establish cardiology care. She had been followed in the past in our [place] office by Dr. Freida Fitzgerald. 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 At the visit in our office 04/16/13, she c/o dizziness, SOB, fatigue, flu-like symptoms.  I arranged an echo 04/23/13 which showed LVEF=45%, dilated RV and RA, severe TR. Cardiac cath 05/08/13 with no evidence of CAD. TEE confirmed severe TR. PFTS with severe COPD. She was seen by Dr. Cornelius Fitzgerald and continued conservative therapy was recommended. She is still smoking 3/4 ppd.   She is here today for follow up. She feels great. No LE edema. No chest pain. She is much more relaxed. Anxiety level has been lower.   Primary Care Physician: Dr. Anner Crete [facility])  Past Medical History  Diagnosis Date  . Endocarditis     a. 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  Marland Kitchen PFO (patent foramen ovale)     a. s/p closure in 2007.  Marland Kitchen Hepatitis B carrier   . Tobacco abuse   . Asthma   . Hepatitis C   . Cardiac tamponade     a. s/p subxiphoid window 2007 in setting of TV endocarditis. b. She had suffered a mild anoxic injury during her recent hospitalization felt to be related to hypoperfusion Tina Fitzgerald 06/15/2006 (05/09/2013)  . Cardiac arrest     a. 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  . Memory loss, short term     "since OHS in  2007" (05/09/2013)  . GERD (gastroesophageal reflux disease)   . Arthritis     "knees" (05/09/2013)  . Anxiety   . Depression   . ARF (acute renal failure)     a. 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  . Septic pulmonary embolism     a. During admission for TV endocarditis.  . Severe tricuspid regurgitation     a. By TEE/cath 04/2013 - for outpt referral to [facility].   Marland Kitchen History of tricuspid valve disorder 04/16/2013  . Chronic headaches   . Hx: UTI (urinary tract infection)     Past Surgical History  Procedure Laterality Date  . Tricuspid valve surgery  04/22/2006    debridement of septal leaflet for MSSA bacterial endocarditis with closure PFO  . Patent foramen ovale closure  04/22/2006    closed during tricuspid valve surgery  . Cardiac catheterization      multiple/notes 06/15/2006 (05/09/2013)  . Subxyphoid pericardial window  05/07/2006    for large postoperative pericardial effusion   . Dilation and curettage of uterus      "couple of those years ago; one a couple years ago after I lost a pregnancy" (05/09/2013)  . Tee without cardioversion N/A 05/08/2013    Procedure: TRANSESOPHAGEAL ECHOCARDIOGRAM (TEE);  Surgeon: Lewayne Bunting, MD;  Location: [facility] ENDOSCOPY;  Service: Cardiovascular;  Laterality: N/A;    Current Outpatient Prescriptions  Medication Sig Dispense Refill  . albuterol (PROAIR  HFA) 108 (90 BASE) MCG/ACT inhaler Inhale 2 puffs into the lungs every 6 (six) hours as needed for wheezing or shortness of breath.  1 Inhaler  2  . famotidine (PEPCID) 20 MG tablet Take 20 mg by mouth daily.      Marland Kitchen ibuprofen (ADVIL,MOTRIN) 200 MG tablet Take 200 mg by mouth every 6 (six) hours as needed for pain.      . Multiple Vitamins-Calcium (ONE-A-DAY WOMENS FORMULA PO) Take 1 tablet by mouth daily.      . naproxen (NAPROSYN) 500 MG tablet Take 1 tablet (500 mg total) by mouth 2 (two) times daily with a meal. Only takes during menstrual cycle  60 tablet  5  .  Omega-3 Fatty Acids (FISH OIL) 1000 MG CAPS Take 1 capsule (1,000 mg total) by mouth daily.  30 capsule  0  . potassium gluconate 595 MG TABS Take 595 mg by mouth every other day.      . sertraline (ZOLOFT) 100 MG tablet Take 1 tablet (100 mg total) by mouth daily.  30 tablet  5  . zinc gluconate 50 MG tablet Take 50 mg by mouth every 7 (seven) days.       No current facility-administered medications for this visit.    Allergies  Allergen Reactions  . Amoxicillin Other (See Comments)    " I get very sick with this medication"  . Latex Rash    " I'm allergic to latex, and it breaks by skin out in an itchy rash"    History   Social History  . Marital Status: Married    Spouse Name: N/A    Number of Children: 6  . Years of Education: N/A   Occupational History  . disabled    Social History Main Topics  . Smoking status: Current Every Day Smoker -- 0.75 packs/day for 30 years    Types: Cigarettes  . Smokeless tobacco: Never Used  . Alcohol Use: No  . Drug Use: Yes    Special: Marijuana, Heroin, "Crack" cocaine, Cocaine     Comment: 05/09/2013 "<1 joint/day; last heroin & crack was before heart OR in 2007"  . Sexual Activity: Yes   Other Topics Concern  . Not on file   Social History Narrative  . No narrative on file    Family History  Problem Relation Age of Onset  . Hyperlipidemia Mother   . Hyperlipidemia Brother   . Heart disease Maternal Grandmother   . Alcohol abuse Father   . Arthritis Father   . Diabetes Daughter   . Ovarian cancer Cousin   . Mental illness Other     Review of Systems:  As stated in the HPI and otherwise negative.   BP 130/92  Pulse 89  Ht 5' 5.5" (1.664 m)  Wt 157 lb 3.2 oz (71.305 kg)  BMI 25.75 kg/m2  Physical Examination: General: Well developed, well nourished, NAD HEENT: OP clear, mucus membranes moist SKIN: warm, dry. No rashes. Neuro: No focal deficits Musculoskeletal: Muscle strength 5/5 all ext Psychiatric: Mood and  affect normal Neck: No JVD, no carotid bruits, no thyromegaly, no lymphadenopathy. Lungs:Clear bilaterally, no wheezes, rhonci, crackles Cardiovascular: Regular rate and rhythm with systolic murmur. No gallops or rubs. Abdomen:Soft. Bowel sounds present. Non-tender.  Extremities: No lower extremity edema. Pulses are 2 + in the bilateral DP/PT.  Echo 04/23/13: Left ventricle: The cavity size was normal. Wall thickness was normal. The estimated ejection fraction was 45%. Diffuse hypokinesis. - Right ventricle: The cavity  size was severely dilated. - Right atrium: The atrium was moderately dilated. - Atrial septum: No defect or patent foramen ovale was identified. - Tricuspid valve: Etiology of the severe TR is not apparent Severe regurgitation.  Cardiac cath 05/08/13:  Hemodynamic Findings:  Ao: 152/87  LV: 153/5/12  RA: 12  RV: 29/8/12  PA: 26/5 (mean 16)  PCWP: 8  Fick Cardiac Output: 3.86 L/min  Fick Cardiac Index: 2.08 L/min/m2  Central Aortic Saturation: 96%  Pulmonary Artery Saturation: 66%  Angiographic Findings:  Left main: No obstructive disease.  Left Anterior Descending Artery:Large caliber vessel that courses to the apex. Several small caliber diagonal branches. No obstructive disease.  Circumflex Artery: Moderate caliber vessel with early obtuse marginal branch. No obstructive disease.  Right Coronary Artery: Large dominant vessel with no obstructive disease.  Left Ventricular Angiogram: LVEF=45% with global hypokinesis.  Impression:  1. No angiographic evidence of CAD  2. Mild global LV systolic dysfunction  3. Known severe tricuspid valve regurgitation  Recommendations: Will review findings of TEE this am and plan referral to see Dr. Cornelius Moraswen in [place] surgery office for evaluation of her tricuspid valve. Dr. Cornelius Moraswen performed her tricuspid valve procedure in 2007.   TEE 05/08/13:  Left ventricle: Systolic function was normal. The estimated ejection fraction was in the range  of 50% to 55%. Hypokinesis of the distalanteroseptal myocardium. - Aortic valve: No evidence of vegetation. - Mitral valve: No evidence of vegetation. - Right ventricle: The cavity size was moderately dilated. Systolic function was mildly reduced. - Right atrium: The atrium was moderately dilated. - Atrial septum: No defect or patent foramen ovale was identified. There was an atrial septal aneurysm. - Tricuspid valve: Mobility of the septal leaflet was restricted. No evidence of vegetation. Wide-open regurgitation. - Pulmonic valve: No evidence of vegetation.  Assessment and Plan:   1. Tricuspid Valve regurgitation: Severe by echo 04/23/13 and confirmed by TEE 05/08/13. Cardiac cath with no CAD. She has been evaluated by Dr. Cornelius Moraswen with CT surgery. Since she has no signs of right sided heart failure, no indication for tricuspid valve repair at this time. She is doing well. Will continue to follow. Will repeat echo in 6 months and I will see her after the echo to review.   2. COPD: We have discussed referral to Pulmonary clinic but she has not kept that appt.   3. Tobacco abuse: Smoking cessation recommended. She wishes to stop.

## 2014-01-23 NOTE — Patient Instructions (Signed)
Your physician wants you to follow-up in: 6 months.  You will receive a reminder letter in the mail two months in advance. If you don't receive a letter, please call our office to schedule the follow-up appointment.  Your physician has requested that you have an echocardiogram. Echocardiography is a painless test that uses sound waves to create images of your heart. It provides your doctor with information about the size and shape of your heart and how well your heart's chambers and valves are working. This procedure takes approximately one hour. There are no restrictions for this procedure. To be done in about 6 months--week or 2 prior to appointment with Dr. Clifton JamesMcAlhany

## 2014-01-29 ENCOUNTER — Telehealth: Payer: Self-pay | Admitting: Cardiovascular Disease

## 2014-01-29 NOTE — Telephone Encounter (Signed)
Follow up  ° ° °Patient calling back to speak with nurse  °

## 2014-01-29 NOTE — Telephone Encounter (Signed)
Spoke with medical records and they do not see documentation allowing us to speak with Social Security.

## 2014-01-29 NOTE — Telephone Encounter (Signed)
New message    Patient calling regarding social security .has questions / concerns.

## 2014-01-29 NOTE — Telephone Encounter (Signed)
Spoke with pt who reports she checked with Social Security and was told call was placed to our office.  I again told her I did not see any documentation of call.  I also told her there is no authorization from her allowing us to speak with Social Security.  I told her she should check with Social Security about signing a release of information.  I asked her to have any request for information from Social Security faxed to our office.

## 2014-01-29 NOTE — Telephone Encounter (Signed)
Spoke with pt who states Dr. Clifton JamesMcAlhany recently completed paperwork for her regarding social security checks.  She states she took paperwork to [employer] and rep (pt states she thinks it was Mr. Lillia MountainWilkins) called our office regarding paperwork while she was there.  I told her I did not see any documentation of this call.  Pt is asking if I can contact Mr. Lillia MountainWilkins to help expedite process so she can get check from Social Security.  I told pt to ask Social Security to contact our office. I told her  I would need to check documents to see if she has given our office authorization to speak with Social Security.

## 2014-01-31 ENCOUNTER — Encounter: Payer: Self-pay | Admitting: Physician Assistant

## 2014-02-12 ENCOUNTER — Encounter: Payer: Self-pay | Admitting: Physician Assistant

## 2014-02-13 ENCOUNTER — Other Ambulatory Visit: Payer: Self-pay | Admitting: *Deleted

## 2014-02-13 MED ORDER — POTASSIUM GLUCONATE 595 (99 K) MG PO TABS: 595.0000 mg | ORAL_TABLET | ORAL | Status: DC

## 2014-02-13 MED ORDER — VITAMIN C-ACEROLA 500 MG PO TABS: 500.0000 mg | ORAL_TABLET | Freq: Every day | ORAL | Status: DC

## 2014-02-13 NOTE — Telephone Encounter (Signed)
Sent email needing rx for Vitamin C w/rosehip & potassium 595 mg....Raechel Chute/lmb

## 2014-02-13 NOTE — Telephone Encounter (Signed)
See 2nd email. Replied to pt will send rx for vitamins to [facility]...Raechel Chute/lmb

## 2014-04-01 ENCOUNTER — Other Ambulatory Visit (HOSPITAL_COMMUNITY)
Admission: RE | Admit: 2014-04-01 | Discharge: 2014-04-01 | Disposition: A | Discharge status: Home / Self Care | Payer: Medicare Other | Source: Ambulatory Visit | Attending: Obstetrics and Gynecology | Admitting: Obstetrics and Gynecology

## 2014-04-01 ENCOUNTER — Ambulatory Visit (INDEPENDENT_AMBULATORY_CARE_PROVIDER_SITE_OTHER): Payer: Medicare Other | Admitting: Obstetrics and Gynecology

## 2014-04-01 ENCOUNTER — Encounter: Payer: Self-pay | Admitting: Obstetrics and Gynecology

## 2014-04-01 VITALS — BP 129/86 | HR 77 | Ht 65.5 in | Wt 153.3 lb

## 2014-04-01 DIAGNOSIS — I38 Endocarditis, valve unspecified: Secondary | ICD-10-CM

## 2014-04-01 DIAGNOSIS — Z124 Encounter for screening for malignant neoplasm of cervix: Secondary | ICD-10-CM | POA: Insufficient documentation

## 2014-04-01 DIAGNOSIS — Z1151 Encounter for screening for human papillomavirus (HPV): Secondary | ICD-10-CM | POA: Insufficient documentation

## 2014-04-01 DIAGNOSIS — Z113 Encounter for screening for infections with a predominantly sexual mode of transmission: Secondary | ICD-10-CM | POA: Insufficient documentation

## 2014-04-01 DIAGNOSIS — Z01812 Encounter for preprocedural laboratory examination: Secondary | ICD-10-CM

## 2014-04-01 DIAGNOSIS — Z01419 Encounter for gynecological examination (general) (routine) without abnormal findings: Secondary | ICD-10-CM

## 2014-04-01 DIAGNOSIS — Z309 Encounter for contraceptive management, unspecified: Secondary | ICD-10-CM

## 2014-04-01 DIAGNOSIS — I079 Rheumatic tricuspid valve disease, unspecified: Secondary | ICD-10-CM

## 2014-04-01 LAB — POCT PREGNANCY, URINE: Preg Test, Ur: NEGATIVE

## 2014-04-01 MED ORDER — LEVONORGESTREL 20 MCG/24HR IU IUD
INTRAUTERINE_SYSTEM | Freq: Once | INTRAUTERINE | Status: AC
  Administered 2014-04-01: 1 via INTRAUTERINE

## 2014-04-01 NOTE — Progress Notes (Signed)
Subjective:     Tina Fitzgerald is a 43 y.o. female and is here for a comprehensive physical exam. The patient reports no problems. She is sexually active using condoms for contraception. She desires IUD insertion today. Patient reports a history of ovarian cysts and desires follow up on that. Patient also desires full STD testing  History   Social History  . Marital Status: Married    Spouse Name: N/A    Number of Children: 6  . Years of Education: N/A   Occupational History  . disabled    Social History Main Topics  . Smoking status: Current Every Day Smoker -- 0.75 packs/day for 30 years    Types: Cigarettes  . Smokeless tobacco: Never Used  . Alcohol Use: No  . Drug Use: Yes    Special: Marijuana, Heroin, "Crack" cocaine, Cocaine     Comment: 05/09/2013 "<1 joint/day; last heroin & crack was before heart OR in 2007"  . Sexual Activity: Yes    Birth Control/ Protection: Condom   Other Topics Concern  . Not on file   Social History Narrative  . No narrative on file   Health Maintenance  Topic Date Due  . Pap Smear  01/26/1989  . Influenza Vaccine  06/01/2014  . Tetanus/tdap  01/17/2024      Past Medical History  Diagnosis Date  . Endocarditis     a. 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  Marland Kitchen. PFO (patent foramen ovale)     a. s/p closure in 2007.  Marland Kitchen. Hepatitis B carrier   . Tobacco abuse   . Asthma   . Hepatitis C   . Cardiac tamponade     a. s/p subxiphoid window 2007 in setting of TV endocarditis. b. She had suffered a mild anoxic injury during her recent hospitalization felt to be related to hypoperfusion [name]/notes 06/15/2006 (05/09/2013)  . Cardiac arrest     a. Asystolic arrest  [id number] in setting of TV endocarditis, pericardial tamponade.  . Memory loss, short term     "since [facility] in 2007" (05/09/2013)  . GERD (gastroesophageal reflux disease)   . Arthritis     "knees" (05/09/2013)  . Anxiety   . Depression   . ARF (acute renal failure)     a. 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  . Septic pulmonary embolism     a. During admission for TV endocarditis.  . Severe tricuspid regurgitation     a. By TEE/cath 04/2013 - for outpt referral to [facility].   Marland Kitchen. History of tricuspid valve disorder 04/16/2013  . Chronic headaches   . Hx: UTI (urinary tract infection)    Past Surgical History  Procedure Laterality Date  . Tricuspid valve surgery  04/22/2006    debridement of septal leaflet for MSSA bacterial endocarditis with closure PFO  . Patent foramen ovale closure  04/22/2006    closed during tricuspid valve surgery  . Cardiac catheterization      multiple/notes 06/15/2006 (05/09/2013)  . Subxyphoid pericardial window  05/07/2006    for large postoperative pericardial effusion   . Dilation and curettage of uterus      "couple of those years ago; one a couple years ago after I lost a pregnancy" (05/09/2013)  .  Tee without cardioversion N/A 05/08/2013    Procedure: TRANSESOPHAGEAL ECHOCARDIOGRAM (TEE);  Surgeon: Lewayne Bunting, MD;  Location: [facility];  Service: Cardiovascular;  Laterality: N/A;   Family History  Problem Relation Age of Onset  . Hyperlipidemia Mother   . Hyperlipidemia Brother   . Heart disease Maternal Grandmother   . Alcohol abuse Father   . Arthritis Father   . Diabetes Daughter   . Ovarian cancer Cousin   . Mental illness Other     Review of Systems A comprehensive review of systems was negative.   Objective:      GENERAL: Well-developed, well-nourished female in no acute distress.  HEENT: Normocephalic, atraumatic. Sclerae anicteric.  NECK: Supple. Normal thyroid.  LUNGS: Clear to auscultation bilaterally.   HEART: Regular rate and rhythm. BREASTS: Symmetric in size. No palpable masses or lymphadenopathy, skin changes, or nipple drainage. ABDOMEN: Soft, nontender, nondistended. No organomegaly. PELVIC: Normal external female genitalia with a 1 cm ingrown hair follicle. Vagina is pink and rugated.  Normal discharge. Normal appearing cervix. Uterus is normal in size, retroverted. No adnexal mass or tenderness. EXTREMITIES: No cyanosis, clubbing, or edema, 2+ distal pulses.    Assessment:    Healthy female exam.      Plan:  Pap smear collected Cultures collected Patient desires HIV, syphilis testing. She reports being a hep B and C carrier and is followed by her PCP Screening mammogram ordered Pelvic ultrasound also ordered secondary to h/o ovarian cyst Patient desires IUD for contraception IUD Procedure Note Patient identified, informed consent performed, signed copy in chart, time out was performed.  Urine pregnancy test negative.  Speculum placed in the vagina.  Cervix visualized.  Cleaned with Betadine x 2.  Grasped anteriorly with a single tooth tenaculum.  Uterus sounded to 9 cm.  Mirena IUD placed per manufacturer's recommendations.  Strings trimmed to 3 cm. Tenaculum was removed, good hemostasis noted.  Patient tolerated procedure well.   Patient given post procedure instructions and Mirena care card with expiration date.  Patient is asked to check IUD strings periodically and follow up in 4-6 weeks for IUD check.     See After Visit Summary for Counseling Recommendations

## 2014-04-01 NOTE — Patient Instructions (Signed)
Levonorgestrel intrauterine device (IUD) What is this medicine? LEVONORGESTREL IUD (LEE voe nor jes trel) is a contraceptive (birth control) device. The device is placed inside the uterus by a healthcare professional. It is used to prevent pregnancy and can also be used to treat heavy bleeding that occurs during your period. Depending on the device, it can be used for 3 to 5 years. This medicine may be used for other purposes; ask your health care provider or pharmacist if you have questions. COMMON BRAND NAME(S): Jerral Bonito What should I tell my health care provider before I take this medicine? They need to know if you have any of these conditions: -abnormal Pap smear -cancer of the breast, uterus, or cervix -diabetes -endometritis -genital or pelvic infection now or in the past -have more than one sexual partner or your partner has more than one partner -heart disease -history of an ectopic or tubal pregnancy -immune system problems -IUD in place -liver disease or tumor -problems with blood clots or take blood-thinners -use intravenous drugs -uterus of unusual shape -vaginal bleeding that has not been explained -an unusual or allergic reaction to levonorgestrel, other hormones, silicone, or polyethylene, medicines, foods, dyes, or preservatives -pregnant or trying to get pregnant -breast-feeding How should I use this medicine? This device is placed inside the uterus by a health care professional. Talk to your pediatrician regarding the use of this medicine in children. Special care may be needed. Overdosage: If you think you have taken too much of this medicine contact a poison control center or emergency room at once. NOTE: This medicine is only for you. Do not share this medicine with others. What if I miss a dose? This does not apply. What may interact with this medicine? Do not take this medicine with any of the following  medications: -amprenavir -bosentan -fosamprenavir This medicine may also interact with the following medications: -aprepitant -barbiturate medicines for inducing sleep or treating seizures -bexarotene -griseofulvin -medicines to treat seizures like carbamazepine, ethotoin, felbamate, oxcarbazepine, phenytoin, topiramate -modafinil -pioglitazone -rifabutin -rifampin -rifapentine -some medicines to treat HIV infection like atazanavir, indinavir, lopinavir, nelfinavir, tipranavir, ritonavir -St. John's wort -warfarin This list may not describe all possible interactions. Give your health care provider a list of all the medicines, herbs, non-prescription drugs, or dietary supplements you use. Also tell them if you smoke, drink alcohol, or use illegal drugs. Some items may interact with your medicine. What should I watch for while using this medicine? Visit your doctor or health care professional for regular check ups. See your doctor if you or your partner has sexual contact with others, becomes HIV positive, or gets a sexual transmitted disease. This product does not protect you against HIV infection (AIDS) or other sexually transmitted diseases. You can check the placement of the IUD yourself by reaching up to the top of your vagina with clean fingers to feel the threads. Do not pull on the threads. It is a good habit to check placement after each menstrual period. Call your doctor right away if you feel more of the IUD than just the threads or if you cannot feel the threads at all. The IUD may come out by itself. You may become pregnant if the device comes out. If you notice that the IUD has come out use a backup birth control method like condoms and call your health care provider. Using tampons will not change the position of the IUD and are okay to use during your period. What side effects may I  notice from receiving this medicine? Side effects that you should report to your doctor or  health care professional as soon as possible: -allergic reactions like skin rash, itching or hives, swelling of the face, lips, or tongue -fever, flu-like symptoms -genital sores -high blood pressure -no menstrual period for 6 weeks during use -pain, swelling, warmth in the leg -pelvic pain or tenderness -severe or sudden headache -signs of pregnancy -stomach cramping -sudden shortness of breath -trouble with balance, talking, or walking -unusual vaginal bleeding, discharge -yellowing of the eyes or skin Side effects that usually do not require medical attention (report to your doctor or health care professional if they continue or are bothersome): -acne -breast pain -change in sex drive or performance -changes in weight -cramping, dizziness, or faintness while the device is being inserted -headache -irregular menstrual bleeding within first 3 to 6 months of use -nausea This list may not describe all possible side effects. Call your doctor for medical advice about side effects. You may report side effects to FDA at 1-800-FDA-1088. Where should I keep my medicine? This does not apply. NOTE: This sheet is a summary. It may not cover all possible information. If you have questions about this medicine, talk to your doctor, pharmacist, or health care provider.  2014, [employer]vier/[employer] Standard. (2011-11-18 13:54:04)   Preventive Care for Adults, Female A healthy lifestyle and preventive care can promote health and wellness. Preventive health guidelines for women include the following key practices.  A routine yearly physical is a good way to check with your health care provider about your health and preventive screening. It is a chance to share any concerns and updates on your health and to receive a thorough exam.  Visit your dentist for a routine exam and preventive care every 6 months. Brush your teeth twice a day and floss once a day. Good oral hygiene prevents tooth decay and gum  disease.  The frequency of eye exams is based on your age, health, family medical history, use of contact lenses, and other factors. Follow your health care provider's recommendations for frequency of eye exams.  Eat a healthy diet. Foods like vegetables, fruits, whole grains, low-fat dairy products, and lean protein foods contain the nutrients you need without too many calories. Decrease your intake of foods high in solid fats, added sugars, and salt. Eat the right amount of calories for you.Get information about a proper diet from your health care provider, if necessary.  Regular physical exercise is one of the most important things you can do for your health. Most adults should get at least 150 minutes of moderate-intensity exercise (any activity that increases your heart rate and causes you to sweat) each week. In addition, most adults need muscle-strengthening exercises on 2 or more days a week.  Maintain a healthy weight. The body mass index (BMI) is a screening tool to identify possible weight problems. It provides an estimate of body fat based on height and weight. Your health care provider can find your BMI, and can help you achieve or maintain a healthy weight.For adults 20 years and older:  A BMI below 18.5 is considered underweight.  A BMI of 18.5 to 24.9 is normal.  A BMI of 25 to 29.9 is considered overweight.  A BMI of 30 and above is considered obese.  Maintain normal blood lipids and cholesterol levels by exercising and minimizing your intake of saturated fat. Eat a balanced diet with plenty of fruit and vegetables. Blood tests for lipids and cholesterol  should begin at age 76 and be repeated every 5 years. If your lipid or cholesterol levels are high, you are over 50, or you are at high risk for heart disease, you may need your cholesterol levels checked more frequently.Ongoing high lipid and cholesterol levels should be treated with medicines if diet and exercise are not  working.  If you smoke, find out from your health care provider how to quit. If you do not use tobacco, do not start.  Lung cancer screening is recommended for adults aged 100 80 years who are at high risk for developing lung cancer because of a history of smoking. A yearly low-dose CT scan of the lungs is recommended for people who have at least a 30-pack-year history of smoking and are a current smoker or have quit within the past 15 years. A pack year of smoking is smoking an average of 1 pack of cigarettes a day for 1 year (for example: 1 pack a day for 30 years or 2 packs a day for 15 years). Yearly screening should continue until the smoker has stopped smoking for at least 15 years. Yearly screening should be stopped for people who develop a health problem that would prevent them from having lung cancer treatment.  If you are pregnant, do not drink alcohol. If you are breastfeeding, be very cautious about drinking alcohol. If you are not pregnant and choose to drink alcohol, do not have more than 1 drink per day. One drink is considered to be 12 ounces (355 mL) of beer, 5 ounces (148 mL) of wine, or 1.5 ounces (44 mL) of liquor.  Avoid use of street drugs. Do not share needles with anyone. Ask for help if you need support or instructions about stopping the use of drugs.  High blood pressure causes heart disease and increases the risk of stroke. Your blood pressure should be checked at least every 1 to 2 years. Ongoing high blood pressure should be treated with medicines if weight loss and exercise do not work.  If you are 70 43 years old, ask your health care provider if you should take aspirin to prevent strokes.  Diabetes screening involves taking a blood sample to check your fasting blood sugar level. This should be done once every 3 years, after age 58, if you are within normal weight and without risk factors for diabetes. Testing should be considered at a younger age or be carried out more  frequently if you are overweight and have at least 1 risk factor for diabetes.  Breast cancer screening is essential preventive care for women. You should practice "breast self-awareness." This means understanding the normal appearance and feel of your breasts and may include breast self-examination. Any changes detected, no matter how small, should be reported to a health care provider. Women in their 82s and 30s should have a clinical breast exam (CBE) by a health care provider as part of a regular health exam every 1 to 3 years. After age 77, women should have a CBE every year. Starting at age 58, women should consider having a mammogram (breast X-ray test) every year. Women who have a family history of breast cancer should talk to their health care provider about genetic screening. Women at a high risk of breast cancer should talk to their health care providers about having an MRI and a mammogram every year.  Breast cancer gene (BRCA)-related cancer risk assessment is recommended for women who have family members with BRCA-related cancers. BRCA-related cancers  include breast, ovarian, tubal, and peritoneal cancers. Having family members with these cancers may be associated with an increased risk for harmful changes (mutations) in the breast cancer genes BRCA1 and BRCA2. Results of the assessment will determine the need for genetic counseling and BRCA1 and BRCA2 testing.  The Pap test is a screening test for cervical cancer. A Pap test can show cell changes on the cervix that might become cervical cancer if left untreated. A Pap test is a procedure in which cells are obtained and examined from the lower end of the uterus (cervix).  Women should have a Pap test starting at age 39.  Between ages 35 and 55, Pap tests should be repeated every 2 years.  Beginning at age 71, you should have a Pap test every 3 years as long as the past 3 Pap tests have been normal.  Some women have medical problems that  increase the chance of getting cervical cancer. Talk to your health care provider about these problems. It is especially important to talk to your health care provider if a new problem develops soon after your last Pap test. In these cases, your health care provider may recommend more frequent screening and Pap tests.  The above recommendations are the same for women who have or have not gotten the vaccine for human papillomavirus (HPV).  If you had a hysterectomy for a problem that was not cancer or a condition that could lead to cancer, then you no longer need Pap tests. Even if you no longer need a Pap test, a regular exam is a good idea to make sure no other problems are starting.  If you are between ages 95 and 51 years, and you have had normal Pap tests going back 10 years, you no longer need Pap tests. Even if you no longer need a Pap test, a regular exam is a good idea to make sure no other problems are starting.  If you have had past treatment for cervical cancer or a condition that could lead to cancer, you need Pap tests and screening for cancer for at least 20 years after your treatment.  If Pap tests have been discontinued, risk factors (such as a new sexual partner) need to be reassessed to determine if screening should be resumed.  The HPV test is an additional test that may be used for cervical cancer screening. The HPV test looks for the virus that can cause the cell changes on the cervix. The cells collected during the Pap test can be tested for HPV. The HPV test could be used to screen women aged 58 years and older, and should be used in women of any age who have unclear Pap test results. After the age of 28, women should have HPV testing at the same frequency as a Pap test.  Colorectal cancer can be detected and often prevented. Most routine colorectal cancer screening begins at the age of 44 years and continues through age 28 years. However, your health care provider may recommend  screening at an earlier age if you have risk factors for colon cancer. On a yearly basis, your health care provider may provide home test kits to check for hidden blood in the stool. Use of a small camera at the end of a tube, to directly examine the colon (sigmoidoscopy or colonoscopy), can detect the earliest forms of colorectal cancer. Talk to your health care provider about this at age 70, when routine screening begins. Direct exam of the colon  should be repeated every 5 10 years through age 79 years, unless early forms of pre-cancerous polyps or small growths are found.  People who are at an increased risk for hepatitis B should be screened for this virus. You are considered at high risk for hepatitis B if:  You were born in a country where hepatitis B occurs often. Talk with your health care provider about which countries are considered high risk.  Your parents were born in a high-risk country and you have not received a shot to protect against hepatitis B (hepatitis B vaccine).  You have HIV or AIDS.  You use needles to inject street drugs.  You live with, or have sex with, someone who has Hepatitis B.  You get hemodialysis treatment.  You take certain medicines for conditions like cancer, organ transplantation, and autoimmune conditions.  Hepatitis C blood testing is recommended for all people born from [birth year] through 1965 and any individual with known risks for hepatitis C.  Practice safe sex. Use condoms and avoid high-risk sexual practices to reduce the spread of sexually transmitted infections (STIs). STIs include gonorrhea, chlamydia, syphilis, trichomonas, herpes, HPV, and human immunodeficiency virus (HIV). Herpes, HIV, and HPV are viral illnesses that have no cure. They can result in disability, cancer, and death. Sexually active women aged 16 years and younger should be checked for chlamydia. Older women with new or multiple partners should also be tested for chlamydia. Testing  for other STIs is recommended if you are sexually active and at increased risk.  Osteoporosis is a disease in which the bones lose minerals and strength with aging. This can result in serious bone fractures or breaks. The risk of osteoporosis can be identified using a bone density scan. Women ages 23 years and over and women at risk for fractures or osteoporosis should discuss screening with their health care providers. Ask your health care provider whether you should take a calcium supplement or vitamin D to reduce the rate of osteoporosis.  Menopause can be associated with physical symptoms and risks. Hormone replacement therapy is available to decrease symptoms and risks. You should talk to your health care provider about whether hormone replacement therapy is right for you.  Use sunscreen. Apply sunscreen liberally and repeatedly throughout the day. You should seek shade when your shadow is shorter than you. Protect yourself by wearing long sleeves, pants, a wide-brimmed hat, and sunglasses year round, whenever you are outdoors.  Once a month, do a whole body skin exam, using a mirror to look at the skin on your back. Tell your health care provider of new moles, moles that have irregular borders, moles that are larger than a pencil eraser, or moles that have changed in shape or color.  Stay current with required vaccines (immunizations).  Influenza vaccine. All adults should be immunized every year.  Tetanus, diphtheria, and acellular pertussis (Td, Tdap) vaccine. Pregnant women should receive 1 dose of Tdap vaccine during each pregnancy. The dose should be obtained regardless of the length of time since the last dose. Immunization is preferred during the 27th 36th week of gestation. An adult who has not previously received Tdap or who does not know her vaccine status should receive 1 dose of Tdap. This initial dose should be followed by tetanus and diphtheria toxoids (Td) booster doses every 10  years. Adults with an unknown or incomplete history of completing a 3-dose immunization series with Td-containing vaccines should begin or complete a primary immunization series including a Tdap dose.  Adults should receive a Td booster every 10 years.  Varicella vaccine. An adult without evidence of immunity to varicella should receive 2 doses or a second dose if she has previously received 1 dose. Pregnant females who do not have evidence of immunity should receive the first dose after pregnancy. This first dose should be obtained before leaving the health care facility. The second dose should be obtained 4 8 weeks after the first dose.  Human papillomavirus (HPV) vaccine. Females aged 29 26 years who have not received the vaccine previously should obtain the 3-dose series. The vaccine is not recommended for use in pregnant females. However, pregnancy testing is not needed before receiving a dose. If a female is found to be pregnant after receiving a dose, no treatment is needed. In that case, the remaining doses should be delayed until after the pregnancy. Immunization is recommended for any person with an immunocompromised condition through the age of 37 years if she did not get any or all doses earlier. During the 3-dose series, the second dose should be obtained 4 8 weeks after the first dose. The third dose should be obtained 24 weeks after the first dose and 16 weeks after the second dose.  Zoster vaccine. One dose is recommended for adults aged 64 years or older unless certain conditions are present.  Measles, mumps, and rubella (MMR) vaccine. Adults born before [birth year] generally are considered immune to measles and mumps. Adults born in [birth year] or later should have 1 or more doses of MMR vaccine unless there is a contraindication to the vaccine or there is laboratory evidence of immunity to each of the three diseases. A routine second dose of MMR vaccine should be obtained at least 28 days after the first  dose for students attending postsecondary schools, health care workers, or international travelers. People who received inactivated measles vaccine or an unknown type of measles vaccine during 1963 1967 should receive 2 doses of MMR vaccine. People who received inactivated mumps vaccine or an unknown type of mumps vaccine before 1979 and are at high risk for mumps infection should consider immunization with 2 doses of MMR vaccine. For females of childbearing age, rubella immunity should be determined. If there is no evidence of immunity, females who are not pregnant should be vaccinated. If there is no evidence of immunity, females who are pregnant should delay immunization until after pregnancy. Unvaccinated health care workers born before [birth year] who lack laboratory evidence of measles, mumps, or rubella immunity or laboratory confirmation of disease should consider measles and mumps immunization with 2 doses of MMR vaccine or rubella immunization with 1 dose of MMR vaccine.  Pneumococcal 13-valent conjugate (PCV13) vaccine. When indicated, a person who is uncertain of her immunization history and has no record of immunization should receive the PCV13 vaccine. An adult aged 17 years or older who has certain medical conditions and has not been previously immunized should receive 1 dose of PCV13 vaccine. This PCV13 should be followed with a dose of pneumococcal polysaccharide (PPSV23) vaccine. The PPSV23 vaccine dose should be obtained at least 8 weeks after the dose of PCV13 vaccine. An adult aged 10 years or older who has certain medical conditions and previously received 1 or more doses of PPSV23 vaccine should receive 1 dose of PCV13. The PCV13 vaccine dose should be obtained 1 or more years after the last PPSV23 vaccine dose.  Pneumococcal polysaccharide (PPSV23) vaccine. When PCV13 is also indicated, PCV13 should be obtained first. All adults aged 34  years and older should be immunized. An adult younger than  age 8 years who has certain medical conditions should be immunized. Any person who resides in a nursing home or long-term care facility should be immunized. An adult smoker should be immunized. People with an immunocompromised condition and certain other conditions should receive both PCV13 and PPSV23 vaccines. People with human immunodeficiency virus (HIV) infection should be immunized as soon as possible after diagnosis. Immunization during chemotherapy or radiation therapy should be avoided. Routine use of PPSV23 vaccine is not recommended for American Indians, [place] Natives, or people younger than 65 years unless there are medical conditions that require PPSV23 vaccine. When indicated, people who have unknown immunization and have no record of immunization should receive PPSV23 vaccine. One-time revaccination 5 years after the first dose of PPSV23 is recommended for people aged 45 64 years who have chronic kidney failure, nephrotic syndrome, asplenia, or immunocompromised conditions. People who received 1 2 doses of PPSV23 before age 50 years should receive another dose of PPSV23 vaccine at age 71 years or later if at least 5 years have passed since the previous dose. Doses of PPSV23 are not needed for people immunized with PPSV23 at or after age 102 years.  Meningococcal vaccine. Adults with asplenia or persistent complement component deficiencies should receive 2 doses of quadrivalent meningococcal conjugate (MenACWY-D) vaccine. The doses should be obtained at least 2 months apart. Microbiologists working with certain meningococcal bacteria, [place] recruits, people at risk during an outbreak, and people who travel to or live in countries with a high rate of meningitis should be immunized. A first-year college student up through age 3 years who is living in a residence hall should receive a dose if she did not receive a dose on or after her 16th birthday. Adults who have certain high-risk conditions  should receive one or more doses of vaccine.  Hepatitis A vaccine. Adults who wish to be protected from this disease, have certain high-risk conditions, work with hepatitis A-infected animals, work in hepatitis A research labs, or travel to or work in countries with a high rate of hepatitis A should be immunized. Adults who were previously unvaccinated and who anticipate close contact with an international adoptee during the first 60 days after arrival in the [place] from a country with a high rate of hepatitis A should be immunized.  Hepatitis B vaccine. Adults who wish to be protected from this disease, have certain high-risk conditions, may be exposed to blood or other infectious body fluids, are household contacts or sex partners of hepatitis B positive people, are clients or workers in certain care facilities, or travel to or work in countries with a high rate of hepatitis B should be immunized.  Haemophilus influenzae type b (Hib) vaccine. A previously unvaccinated person with asplenia or sickle cell disease or having a scheduled splenectomy should receive 1 dose of Hib vaccine. Regardless of previous immunization, a recipient of a hematopoietic stem cell transplant should receive a 3-dose series 6 12 months after her successful transplant. Hib vaccine is not recommended for adults with HIV infection. Preventive Services / Frequency Ages 51 to 39years  Blood pressure check.** / Every 1 to 2 years.  Lipid and cholesterol check.** / Every 5 years beginning at age 78.  Clinical breast exam.** / Every 3 years for women in their 79s and 34s.  BRCA-related cancer risk assessment.** / For women who have family members with a BRCA-related cancer (breast, ovarian, tubal, or peritoneal cancers).  Pap test.** / Every 2 years from ages 9 through 58. Every 3 years starting at age 21 through age 65 or 23 with a history of 3 consecutive normal Pap tests.  HPV screening.** / Every 3 years from ages  22 through ages 6 to 83 with a history of 3 consecutive normal Pap tests.  Hepatitis C blood test.** / For any individual with known risks for hepatitis C.  Skin self-exam. / Monthly.  Influenza vaccine. / Every year.  Tetanus, diphtheria, and acellular pertussis (Tdap, Td) vaccine.** / Consult your health care provider. Pregnant women should receive 1 dose of Tdap vaccine during each pregnancy. 1 dose of Td every 10 years.  Varicella vaccine.** / Consult your health care provider. Pregnant females who do not have evidence of immunity should receive the first dose after pregnancy.  HPV vaccine. / 3 doses over 6 months, if 29 and younger. The vaccine is not recommended for use in pregnant females. However, pregnancy testing is not needed before receiving a dose.  Measles, mumps, rubella (MMR) vaccine.** / You need at least 1 dose of MMR if you were born in 1957 or later. You may also need a 2nd dose. For females of childbearing age, rubella immunity should be determined. If there is no evidence of immunity, females who are not pregnant should be vaccinated. If there is no evidence of immunity, females who are pregnant should delay immunization until after pregnancy.  Pneumococcal 13-valent conjugate (PCV13) vaccine.** / Consult your health care provider.  Pneumococcal polysaccharide (PPSV23) vaccine.** / 1 to 2 doses if you smoke cigarettes or if you have certain conditions.  Meningococcal vaccine.** / 1 dose if you are age 55 to 76 years and a Market researcher living in a residence hall, or have one of several medical conditions, you need to get vaccinated against meningococcal disease. You may also need additional booster doses.  Hepatitis A vaccine.** / Consult your health care provider.  Hepatitis B vaccine.** / Consult your health care provider.  Haemophilus influenzae type b (Hib) vaccine.** / Consult your health care provider. Ages 77 to 64years  Blood pressure check.**  / Every 1 to 2 years.  Lipid and cholesterol check.** / Every 5 years beginning at age 56 years.  Lung cancer screening. / Every year if you are aged 43 80 years and have a 30-pack-year history of smoking and currently smoke or have quit within the past 15 years. Yearly screening is stopped once you have quit smoking for at least 15 years or develop a health problem that would prevent you from having lung cancer treatment.  Clinical breast exam.** / Every year after age 23 years.  BRCA-related cancer risk assessment.** / For women who have family members with a BRCA-related cancer (breast, ovarian, tubal, or peritoneal cancers).  Mammogram.** / Every year beginning at age 70 years and continuing for as long as you are in good health. Consult with your health care provider.  Pap test.** / Every 3 years starting at age 59 years through age 25 or 58 years with a history of 3 consecutive normal Pap tests.  HPV screening.** / Every 3 years from ages 38 years through ages 81 to 27 years with a history of 3 consecutive normal Pap tests.  Fecal occult blood test (FOBT) of stool. / Every year beginning at age 11 years and continuing until age 75 years. You may not need to do this test if you get a colonoscopy every 10 years.  Flexible  sigmoidoscopy or colonoscopy.** / Every 5 years for a flexible sigmoidoscopy or every 10 years for a colonoscopy beginning at age 74 years and continuing until age 14 years.  Hepatitis C blood test.** / For all people born from [birth year] through 1965 and any individual with known risks for hepatitis C.  Skin self-exam. / Monthly.  Influenza vaccine. / Every year.  Tetanus, diphtheria, and acellular pertussis (Tdap/Td) vaccine.** / Consult your health care provider. Pregnant women should receive 1 dose of Tdap vaccine during each pregnancy. 1 dose of Td every 10 years.  Varicella vaccine.** / Consult your health care provider. Pregnant females who do not have evidence of  immunity should receive the first dose after pregnancy.  Zoster vaccine.** / 1 dose for adults aged 58 years or older.  Measles, mumps, rubella (MMR) vaccine.** / You need at least 1 dose of MMR if you were born in 1957 or later. You may also need a 2nd dose. For females of childbearing age, rubella immunity should be determined. If there is no evidence of immunity, females who are not pregnant should be vaccinated. If there is no evidence of immunity, females who are pregnant should delay immunization until after pregnancy.  Pneumococcal 13-valent conjugate (PCV13) vaccine.** / Consult your health care provider.  Pneumococcal polysaccharide (PPSV23) vaccine.** / 1 to 2 doses if you smoke cigarettes or if you have certain conditions.  Meningococcal vaccine.** / Consult your health care provider.  Hepatitis A vaccine.** / Consult your health care provider.  Hepatitis B vaccine.** / Consult your health care provider.  Haemophilus influenzae type b (Hib) vaccine.** / Consult your health care provider. Ages 24 years and over  Blood pressure check.** / Every 1 to 2 years.  Lipid and cholesterol check.** / Every 5 years beginning at age 11 years.  Lung cancer screening. / Every year if you are aged 108 80 years and have a 30-pack-year history of smoking and currently smoke or have quit within the past 15 years. Yearly screening is stopped once you have quit smoking for at least 15 years or develop a health problem that would prevent you from having lung cancer treatment.  Clinical breast exam.** / Every year after age 27 years.  BRCA-related cancer risk assessment.** / For women who have family members with a BRCA-related cancer (breast, ovarian, tubal, or peritoneal cancers).  Mammogram.** / Every year beginning at age 73 years and continuing for as long as you are in good health. Consult with your health care provider.  Pap test.** / Every 3 years starting at age 80 years through age 94 or  53 years with 3 consecutive normal Pap tests. Testing can be stopped between 65 and 70 years with 3 consecutive normal Pap tests and no abnormal Pap or HPV tests in the past 10 years.  HPV screening.** / Every 3 years from ages 45 years through ages 31 or 106 years with a history of 3 consecutive normal Pap tests. Testing can be stopped between 65 and 70 years with 3 consecutive normal Pap tests and no abnormal Pap or HPV tests in the past 10 years.  Fecal occult blood test (FOBT) of stool. / Every year beginning at age 81 years and continuing until age 60 years. You may not need to do this test if you get a colonoscopy every 10 years.  Flexible sigmoidoscopy or colonoscopy.** / Every 5 years for a flexible sigmoidoscopy or every 10 years for a colonoscopy beginning at age 1 years and continuing  until age 88 years.  Hepatitis C blood test.** / For all people born from [birth year] through 1965 and any individual with known risks for hepatitis C.  Osteoporosis screening.** / A one-time screening for women ages 22 years and over and women at risk for fractures or osteoporosis.  Skin self-exam. / Monthly.  Influenza vaccine. / Every year.  Tetanus, diphtheria, and acellular pertussis (Tdap/Td) vaccine.** / 1 dose of Td every 10 years.  Varicella vaccine.** / Consult your health care provider.  Zoster vaccine.** / 1 dose for adults aged 40 years or older.  Pneumococcal 13-valent conjugate (PCV13) vaccine.** / Consult your health care provider.  Pneumococcal polysaccharide (PPSV23) vaccine.** / 1 dose for all adults aged 49 years and older.  Meningococcal vaccine.** / Consult your health care provider.  Hepatitis A vaccine.** / Consult your health care provider.  Hepatitis B vaccine.** / Consult your health care provider.  Haemophilus influenzae type b (Hib) vaccine.** / Consult your health care provider. ** Family history and personal history of risk and conditions may change your health care  provider's recommendations. Document Released: 12/14/2001 Document Revised: 08/08/2013 Document Reviewed: 03/15/2011 [facility] Patient Information 2014 [place], [place].

## 2014-04-02 LAB — RPR

## 2014-04-02 LAB — HIV ANTIBODY (ROUTINE TESTING W REFLEX): HIV 1&2 Ab, 4th Generation: NONREACTIVE

## 2014-04-03 ENCOUNTER — Telehealth: Payer: Self-pay

## 2014-04-03 NOTE — Telephone Encounter (Signed)
Message copied by Louanna Raw on Wed Apr 03, 2014  1:50 PM ------      Message from: CONSTANT, PEGGY      Created: Wed Apr 03, 2014  1:28 PM       Please inform patient of normal STD screening and normal pap smear ------

## 2014-04-03 NOTE — Telephone Encounter (Signed)
Called and informed patient of results. Patient verbalized understanding and gratitude. No questions or concerns.

## 2014-04-08 ENCOUNTER — Other Ambulatory Visit: Payer: Self-pay | Admitting: Obstetrics and Gynecology

## 2014-04-08 ENCOUNTER — Ambulatory Visit (HOSPITAL_COMMUNITY)
Admission: RE | Admit: 2014-04-08 | Discharge: 2014-04-08 | Disposition: A | Discharge status: Home / Self Care | Payer: Medicare Other | Source: Ambulatory Visit | Attending: Obstetrics and Gynecology | Admitting: Obstetrics and Gynecology

## 2014-04-08 ENCOUNTER — Other Ambulatory Visit (HOSPITAL_COMMUNITY): Payer: Self-pay

## 2014-04-08 DIAGNOSIS — Z01419 Encounter for gynecological examination (general) (routine) without abnormal findings: Secondary | ICD-10-CM

## 2014-04-08 DIAGNOSIS — Z1231 Encounter for screening mammogram for malignant neoplasm of breast: Secondary | ICD-10-CM

## 2014-04-10 ENCOUNTER — Telehealth: Payer: Self-pay | Admitting: Cardiovascular Disease

## 2014-04-10 NOTE — Telephone Encounter (Signed)
Follow up  ° ° ° °Returning call back to nurse  °

## 2014-04-10 NOTE — Telephone Encounter (Signed)
Spoke with pt who states she saw in MyChart that her kidney function was abnormal.  Pt has not had any recent labs here.  Her last lab work was normal.  There are no notes that I am able to find in her chart that state her kidney function is abnormal or that she needs further testing.  She is aware she needs a follow up 2 D Echo in September with a f/u appt with Dr Clifton James.

## 2014-04-10 NOTE — Telephone Encounter (Signed)
New Message:  Pt is requesting a call back form the nurse. States she got a notification on Mychart that her kidney levels were off and she needs an appt with Dr. Clifton James asap. She wants to speak to a nurse about her results and make sure she is understanding everything. The only notification I see is a sept f/u w/ [facility] and an echo... Pt requests a call back from the nurse before she schedules anything

## 2014-04-10 NOTE — Telephone Encounter (Signed)
LEFT MESSAGE  TO  CALL BACK./CY 

## 2014-04-15 ENCOUNTER — Encounter: Payer: Self-pay | Admitting: Obstetrics and Gynecology

## 2014-04-17 ENCOUNTER — Ambulatory Visit (INDEPENDENT_AMBULATORY_CARE_PROVIDER_SITE_OTHER): Payer: Medicare Other | Admitting: Internal Medicine

## 2014-04-17 ENCOUNTER — Encounter: Payer: Self-pay | Admitting: Internal Medicine

## 2014-04-17 ENCOUNTER — Other Ambulatory Visit (INDEPENDENT_AMBULATORY_CARE_PROVIDER_SITE_OTHER): Payer: Medicare Other

## 2014-04-17 ENCOUNTER — Ambulatory Visit: Payer: Self-pay | Admitting: Internal Medicine

## 2014-04-17 VITALS — BP 126/82 | HR 76 | Temp 98.5°F | Resp 16 | Ht 65.5 in

## 2014-04-17 DIAGNOSIS — J984 Other disorders of lung: Secondary | ICD-10-CM

## 2014-04-17 DIAGNOSIS — F17200 Nicotine dependence, unspecified, uncomplicated: Secondary | ICD-10-CM

## 2014-04-17 DIAGNOSIS — D751 Secondary polycythemia: Secondary | ICD-10-CM

## 2014-04-17 DIAGNOSIS — Z0000 Encounter for general adult medical examination without abnormal findings: Secondary | ICD-10-CM

## 2014-04-17 DIAGNOSIS — R7309 Other abnormal glucose: Secondary | ICD-10-CM

## 2014-04-17 DIAGNOSIS — B182 Chronic viral hepatitis C: Secondary | ICD-10-CM

## 2014-04-17 DIAGNOSIS — J449 Chronic obstructive pulmonary disease, unspecified: Secondary | ICD-10-CM

## 2014-04-17 DIAGNOSIS — J4489 Other specified chronic obstructive pulmonary disease: Secondary | ICD-10-CM

## 2014-04-17 DIAGNOSIS — Z23 Encounter for immunization: Secondary | ICD-10-CM

## 2014-04-17 DIAGNOSIS — Z720 Tobacco use: Secondary | ICD-10-CM

## 2014-04-17 LAB — COMPREHENSIVE METABOLIC PANEL
ALBUMIN: 4.2 g/dL (ref 3.5–5.2)
ALK PHOS: 54 U/L (ref 39–117)
ALT: 14 U/L (ref 0–35)
AST: 19 U/L (ref 0–37)
BUN: 24 mg/dL — ABNORMAL HIGH (ref 6–23)
CALCIUM: 10 mg/dL (ref 8.4–10.5)
CHLORIDE: 106 meq/L (ref 96–112)
CO2: 28 mEq/L (ref 19–32)
Creatinine, Ser: 0.9 mg/dL (ref 0.4–1.2)
GFR: 74.46 mL/min (ref >60.00)
GLUCOSE: 109 mg/dL — AB (ref 70–99)
POTASSIUM: 5 meq/L (ref 3.5–5.1)
Sodium: 143 mEq/L (ref 135–145)
Total Bilirubin: 0.5 mg/dL (ref 0.2–1.2)
Total Protein: 7.1 g/dL (ref 6.0–8.3)

## 2014-04-17 LAB — HEMOGLOBIN A1C: Hgb A1c MFr Bld: 5.6 % (ref 4.6–6.5)

## 2014-04-17 LAB — CBC WITH DIFFERENTIAL/PLATELET
BASOS PCT: 0.6 % (ref 0.0–3.0)
Basophils Absolute: 0.1 10*3/uL (ref 0.0–0.1)
Eosinophils Absolute: 0.2 10*3/uL (ref 0.0–0.7)
Eosinophils Relative: 1.6 % (ref 0.0–5.0)
HCT: 43.2 % (ref 36.0–46.0)
HEMOGLOBIN: 14.7 g/dL (ref 12.0–15.0)
Lymphocytes Relative: 24.2 % (ref 12.0–46.0)
Lymphs Abs: 3 10*3/uL (ref 0.7–4.0)
MCHC: 34 g/dL (ref 30.0–36.0)
MCV: 95.3 fl (ref 78.0–100.0)
MONO ABS: 0.6 10*3/uL (ref 0.1–1.0)
Monocytes Relative: 4.5 % (ref 3.0–12.0)
NEUTROS PCT: 69.1 % (ref 43.0–77.0)
Neutro Abs: 8.6 10*3/uL — ABNORMAL HIGH (ref 1.4–7.7)
Platelets: 241 10*3/uL (ref 150.0–400.0)
RBC: 4.53 Mil/uL (ref 3.87–5.11)
RDW: 13.4 % (ref 11.5–15.5)
WBC: 12.4 10*3/uL — ABNORMAL HIGH (ref 4.0–10.5)

## 2014-04-17 MED ORDER — TIOTROPIUM BROMIDE MONOHYDRATE 2.5 MCG/ACT IN AERS: 2.0000 | INHALATION_SPRAY | Freq: Two times a day (BID) | RESPIRATORY_TRACT | Status: DC

## 2014-04-17 NOTE — Patient Instructions (Signed)
Chronic Obstructive Pulmonary Disease Chronic obstructive pulmonary disease (COPD) is a common lung condition in which airflow from the lungs is limited. COPD is a general term that can be used to describe many different lung problems that limit airflow, including both chronic bronchitis and emphysema. If you have COPD, your lung function will probably never return to normal, but there are measures you can take to improve lung function and make yourself feel better.  CAUSES   Smoking (common).   Exposure to secondhand smoke.   Genetic problems.  Chronic inflammatory lung diseases or recurrent infections. SYMPTOMS   Shortness of breath, especially with physical activity.   Deep, persistent (chronic) cough with a large amount of thick mucus.   Wheezing.   Rapid breaths (tachypnea).   Gray or bluish discoloration (cyanosis) of the skin, especially in fingers, toes, or lips.   Fatigue.   Weight loss.   Frequent infections or episodes when breathing symptoms become much worse (exacerbations).   Chest tightness. DIAGNOSIS  Your healthcare provider will take a medical history and perform a physical examination to make the initial diagnosis. Additional tests for COPD may include:   Lung (pulmonary) function tests.  Chest X-ray.  CT scan.  Blood tests. TREATMENT  Treatment available to help you feel better when you have COPD include:   Inhaler and nebulizer medicines. These help manage the symptoms of COPD and make your breathing more comfortable  Supplemental oxygen. Supplemental oxygen is only helpful if you have a low oxygen level in your blood.   Exercise and physical activity. These are beneficial for nearly all people with COPD. Some people may also benefit from a pulmonary rehabilitation program. HOME CARE INSTRUCTIONS   Take all medicines (inhaled or pills) as directed by your health care provider.  Only take over-the-counter or prescription medicines  for pain, fever, or discomfort as directed by your health care provider.   Avoid over-the-counter medicines or cough syrups that dry up your airway (such as antihistamines) and slow down the elimination of secretions unless instructed otherwise by your healthcare provider.   If you are a smoker, the most important thing that you can do is stop smoking. Continuing to smoke will cause further lung damage and breathing trouble. Ask your health care provider for help with quitting smoking. He or she can direct you to community resources or hospitals that provide support.  Avoid exposure to irritants such as smoke, chemicals, and fumes that aggravate your breathing.  Use oxygen therapy and pulmonary rehabilitation if directed by your health care provider. If you require home oxygen therapy, ask your healthcare provider whether you should purchase a pulse oximeter to measure your oxygen level at home.   Avoid contact with individuals who have a contagious illness.  Avoid extreme temperature and humidity changes.  Eat healthy foods. Eating smaller, more frequent meals and resting before meals may help you maintain your strength.  Stay active, but balance activity with periods of rest. Exercise and physical activity will help you maintain your ability to do things you want to do.  Preventing infection and hospitalization is very important when you have COPD. Make sure to receive all the vaccines your health care provider recommends, especially the pneumococcal and influenza vaccines. Ask your healthcare provider whether you need a pneumonia vaccine.  Learn and use relaxation techniques to manage stress.  Learn and use controlled breathing techniques as directed by your health care provider. Controlled breathing techniques include:   Pursed lip breathing. Start by breathing   in (inhaling) through your nose for 1 second. Then, purse your lips as if you were going to whistle and breathe out (exhale)  through the pursed lips for 2 seconds.   Diaphragmatic breathing. Start by putting one hand on your abdomen just above your waist. Inhale slowly through your nose. The hand on your abdomen should move out. Then purse your lips and exhale slowly. You should be able to feel the hand on your abdomen moving in as you exhale.   Learn and use controlled coughing to clear mucus from your lungs. Controlled coughing is a series of short, progressive coughs. The steps of controlled coughing are:  1. Lean your head slightly forward.  2. Breathe in deeply using diaphragmatic breathing.  3. Try to hold your breath for 3 seconds.  4. Keep your mouth slightly open while coughing twice.  5. Spit any mucus out into a tissue.  6. Rest and repeat the steps once or twice as needed. SEEK MEDICAL CARE IF:   You are coughing up more mucus than usual.   There is a change in the color or thickness of your mucus.   Your breathing is more labored than usual.   Your breathing is faster than usual.  SEEK IMMEDIATE MEDICAL CARE IF:   You have shortness of breath while you are resting.   You have shortness of breath that prevents you from:  Being able to talk.   Performing your usual physical activities.   You have chest pain lasting longer than 5 minutes.   Your skin color is more cyanotic than usual.  You measure low oxygen saturations for longer than 5 minutes with a pulse oximeter. MAKE SURE YOU:   Understand these instructions.  Will watch your condition.  Will get help right away if you are not doing well or get worse. Document Released: 07/28/2005 Document Revised: 08/08/2013 Document Reviewed: 06/14/2013 [employer] Patient Information 2015 [employer]. This information is not intended to replace advice given to you by your health care provider. Make sure you discuss any questions you have with your health care provider.  

## 2014-04-17 NOTE — Progress Notes (Signed)
Subjective:    Patient ID: Tina Fitzgerald, female    DOB: 07-07-1971, 43 y.o.   MRN: [id number]  Cough This is a recurrent problem. The current episode started more than 1 year ago. The problem has been unchanged. The problem occurs every few hours. The cough is productive of sputum. Pertinent negatives include no chest pain, chills, ear congestion, ear pain, fever, headaches, heartburn, hemoptysis, myalgias, nasal congestion, postnasal drip, rash, rhinorrhea, sore throat, shortness of breath, sweats, weight loss or wheezing. Nothing aggravates the symptoms. Risk factors for lung disease include smoking/tobacco exposure. She has tried a beta-agonist inhaler (albuterol helps but causes a headache) for the symptoms. The treatment provided moderate relief. Her past medical history is significant for COPD. There is no history of asthma, bronchiectasis, bronchitis, emphysema, environmental allergies or pneumonia.      Review of Systems  Constitutional: Negative.  Negative for fever, chills, weight loss, diaphoresis, activity change, appetite change, fatigue and unexpected weight change.  HENT: Negative.  Negative for ear pain, postnasal drip, rhinorrhea, sinus pressure, sore throat and voice change.   Eyes: Negative.   Respiratory: Positive for cough. Negative for apnea, hemoptysis, choking, chest tightness, shortness of breath, wheezing and stridor.   Cardiovascular: Negative.  Negative for chest pain, palpitations and leg swelling.  Gastrointestinal: Negative.  Negative for heartburn, nausea, vomiting, abdominal pain, diarrhea, constipation and blood in stool.  Endocrine: Negative.   Genitourinary: Negative.   Musculoskeletal: Negative.  Negative for arthralgias, back pain, myalgias, neck pain and neck stiffness.  Skin: Negative.  Negative for rash.  Allergic/Immunologic: Negative.  Negative for environmental allergies.  Neurological: Negative.  Negative for dizziness, tremors, syncope, weakness,  light-headedness and headaches.  Hematological: Negative.  Negative for adenopathy. Does not bruise/bleed easily.  Psychiatric/Behavioral: Negative.        Objective:   Physical Exam  Vitals reviewed. Constitutional: She is oriented to person, place, and time. She appears well-developed and well-nourished.  Non-toxic appearance. She does not have a sickly appearance. She does not appear ill. No distress.  HENT:  Head: Normocephalic and atraumatic.  Mouth/Throat: Oropharynx is clear and moist. No oropharyngeal exudate.  Eyes: Conjunctivae are normal. Right eye exhibits no discharge. Left eye exhibits no discharge. No scleral icterus.  Neck: Normal range of motion. Neck supple. No JVD present. No tracheal deviation present. No thyromegaly present.  Cardiovascular: Normal rate and intact distal pulses.  Exam reveals no gallop and no friction rub.   Murmur heard. Pulmonary/Chest: Effort normal. No accessory muscle usage or stridor. Not tachypneic. No respiratory distress. She has no decreased breath sounds. She has no wheezes. She has rhonchi in the right lower field and the left lower field. She has no rales. She exhibits no tenderness.  Breast exam not done. Recently gone by GYN.  Abdominal: Soft. Bowel sounds are normal. She exhibits no distension and no mass. There is no tenderness. There is no rebound and no guarding.  Genitourinary:  Exam not done. Recently done by GYN.  Musculoskeletal: Normal range of motion. She exhibits no edema and no tenderness.  Lymphadenopathy:    She has no cervical adenopathy.  Neurological: She is oriented to person, place, and time.  Skin: Skin is warm and dry. No rash noted. She is not diaphoretic. No erythema. No pallor.  Psychiatric: Her behavior is normal. Judgment and thought content normal. Her mood appears anxious. Her affect is not angry, not blunt, not labile and not inappropriate. Her speech is not rapid and/or pressured, not  delayed and not  tangential. Cognition and memory are impaired. She does not exhibit a depressed mood. She expresses no homicidal and no suicidal ideation. She expresses no suicidal plans and no homicidal plans. She exhibits abnormal recent memory and abnormal remote memory.     Lab Results  Component Value Date   WBC 10.8* 01/16/2014   HGB 16.1* 01/16/2014   HCT 47.5* 01/16/2014   PLT 199.0 01/16/2014   GLUCOSE 107* 01/16/2014   CHOL 164 01/16/2014   TRIG 90.0 01/16/2014   HDL 47.30 01/16/2014   LDLCALC 99 01/16/2014   ALT 18 01/16/2014   AST 18 01/16/2014   NA 139 01/16/2014   K 5.6* 01/16/2014   CL 105 01/16/2014   CREATININE 0.8 01/16/2014   BUN 16 01/16/2014   CO2 29 01/16/2014   TSH 0.82 01/16/2014   INR 1.1* 05/02/2013       Assessment & Plan:

## 2014-04-18 ENCOUNTER — Encounter: Payer: Self-pay | Admitting: Internal Medicine

## 2014-04-18 DIAGNOSIS — Z23 Encounter for immunization: Secondary | ICD-10-CM | POA: Insufficient documentation

## 2014-04-18 DIAGNOSIS — Z0000 Encounter for general adult medical examination without abnormal findings: Secondary | ICD-10-CM | POA: Insufficient documentation

## 2014-04-18 LAB — AFP TUMOR MARKER

## 2014-04-18 MED ORDER — TIOTROPIUM BROMIDE MONOHYDRATE 2.5 MCG/ACT IN AERS: 2.0000 | INHALATION_SPRAY | Freq: Every day | RESPIRATORY_TRACT | Status: DC

## 2014-04-18 NOTE — Assessment & Plan Note (Signed)
She has decreased her tob abuse I have asked her to stop

## 2014-04-18 NOTE — Assessment & Plan Note (Signed)
She will stop albuterol due to the side effects I think she would benefit from spiriva

## 2014-04-18 NOTE — Assessment & Plan Note (Signed)
Improvement noted WBC is elevated c/w tobacco abuse

## 2014-04-18 NOTE — Assessment & Plan Note (Signed)
I will check her AFP and [facility] Will [facility] viral load as well to see if she has cleared the infection She was referred to the Hep C clinic to consider starting therapy for this

## 2014-04-18 NOTE — Assessment & Plan Note (Signed)
Exam done Vaccines were reviewed and updated Labs ordered Pt ed material was given 

## 2014-04-18 NOTE — Addendum Note (Signed)
Addended by: Etta GrandchildJONES, THOMAS L on: 04/18/2014 12:30 PM   Modules accepted: Orders

## 2014-04-19 ENCOUNTER — Telehealth: Payer: Self-pay | Admitting: Internal Medicine

## 2014-04-19 NOTE — Telephone Encounter (Signed)
Relevant patient education assigned to patient using Emmi. ° °

## 2014-04-21 ENCOUNTER — Encounter (HOSPITAL_COMMUNITY): Payer: Self-pay | Admitting: *Deleted

## 2014-04-21 ENCOUNTER — Inpatient Hospital Stay (HOSPITAL_COMMUNITY)
Admission: AD | Admit: 2014-04-21 | Discharge: 2014-04-21 | Disposition: A | Discharge status: Home / Self Care | Payer: Medicare Other | Source: Ambulatory Visit | Attending: Obstetrics & Gynecology | Admitting: Obstetrics & Gynecology

## 2014-04-21 DIAGNOSIS — F17200 Nicotine dependence, unspecified, uncomplicated: Secondary | ICD-10-CM | POA: Insufficient documentation

## 2014-04-21 DIAGNOSIS — F3289 Other specified depressive episodes: Secondary | ICD-10-CM | POA: Insufficient documentation

## 2014-04-21 DIAGNOSIS — F329 Major depressive disorder, single episode, unspecified: Secondary | ICD-10-CM | POA: Insufficient documentation

## 2014-04-21 DIAGNOSIS — B9689 Other specified bacterial agents as the cause of diseases classified elsewhere: Secondary | ICD-10-CM | POA: Insufficient documentation

## 2014-04-21 DIAGNOSIS — K219 Gastro-esophageal reflux disease without esophagitis: Secondary | ICD-10-CM | POA: Insufficient documentation

## 2014-04-21 DIAGNOSIS — N760 Acute vaginitis: Secondary | ICD-10-CM

## 2014-04-21 DIAGNOSIS — F411 Generalized anxiety disorder: Secondary | ICD-10-CM | POA: Insufficient documentation

## 2014-04-21 DIAGNOSIS — A499 Bacterial infection, unspecified: Secondary | ICD-10-CM

## 2014-04-21 LAB — URINALYSIS, ROUTINE W REFLEX MICROSCOPIC
Bilirubin Urine: NEGATIVE
GLUCOSE, UA: NEGATIVE mg/dL
KETONES UR: NEGATIVE mg/dL
Leukocytes, UA: NEGATIVE
Nitrite: NEGATIVE
Protein, ur: NEGATIVE mg/dL
Specific Gravity, Urine: >1.03 — ABNORMAL HIGH (ref 1.005–1.030)
Urobilinogen, UA: 0.2 mg/dL (ref 0.0–1.0)
pH: 5.5 (ref 5.0–8.0)

## 2014-04-21 LAB — WET PREP, GENITAL
Trich, Wet Prep: NONE SEEN
Yeast Wet Prep HPF POC: NONE SEEN

## 2014-04-21 LAB — URINE MICROSCOPIC-ADD ON

## 2014-04-21 MED ORDER — METRONIDAZOLE 500 MG PO TABS: 500.0000 mg | ORAL_TABLET | Freq: Two times a day (BID) | ORAL | Status: DC

## 2014-04-21 NOTE — Discharge Instructions (Signed)
Bacterial Vaginosis Bacterial vaginosis is a vaginal infection that occurs when the normal balance of bacteria in the vagina is disrupted. It results from an overgrowth of certain bacteria. This is the most common vaginal infection in women of childbearing age. Treatment is important to prevent complications, especially in pregnant women, as it can cause a premature delivery. CAUSES  Bacterial vaginosis is caused by an increase in harmful bacteria that are normally present in smaller amounts in the vagina. Several different kinds of bacteria can cause bacterial vaginosis. However, the reason that the condition develops is not fully understood. RISK FACTORS Certain activities or behaviors can put you at an increased risk of developing bacterial vaginosis, including:  Having a new sex partner or multiple sex partners.  Douching.  Using an intrauterine device (IUD) for contraception. Women do not get bacterial vaginosis from toilet seats, bedding, swimming pools, or contact with objects around them. SIGNS AND SYMPTOMS  Some women with bacterial vaginosis have no signs or symptoms. Common symptoms include:  Grey vaginal discharge.  A fishlike odor with discharge, especially after sexual intercourse.  Itching or burning of the vagina and vulva.  Burning or pain with urination. DIAGNOSIS  Your health care provider will take a medical history and examine the vagina for signs of bacterial vaginosis. A sample of vaginal fluid may be taken. Your health care provider will look at this sample under a microscope to check for bacteria and abnormal cells. A vaginal pH test may also be done.  TREATMENT  Bacterial vaginosis may be treated with antibiotic medicines. These may be given in the form of a pill or a vaginal cream. A second round of antibiotics may be prescribed if the condition comes back after treatment.  HOME CARE INSTRUCTIONS   Only take over-the-counter or prescription medicines as  directed by your health care provider.  If antibiotic medicine was prescribed, take it as directed. Make sure you finish it even if you start to feel better.  Do not have sex until treatment is completed.  Tell all sexual partners that you have a vaginal infection. They should see their health care provider and be treated if they have problems, such as a mild rash or itching.  Practice safe sex by using condoms and only having one sex partner. SEEK MEDICAL CARE IF:   Your symptoms are not improving after 3 days of treatment.  You have increased discharge or pain.  You have a fever. MAKE SURE YOU:   Understand these instructions.  Will watch your condition.  Will get help right away if you are not doing well or get worse. FOR MORE INFORMATION  Centers for Disease Control and Prevention, Division of STD Prevention: www.cdc.gov/std American Sexual Health Association (ASHA): www.ashastd.org  Document Released: 10/18/2005 Document Revised: 08/08/2013 Document Reviewed: 05/30/2013 ExitCare Patient Information 2015 [employer]. This information is not intended to replace advice given to you by your health care provider. Make sure you discuss any questions you have with your health care provider.  

## 2014-04-21 NOTE — MAU Note (Signed)
Pt presents to MAU with complaints of having a vaginal odor. She had a mirena placed June the 1st and is high risk for infection, she had open heart surgery 8 years ago.

## 2014-04-21 NOTE — MAU Note (Signed)
Assumed care of patient.

## 2014-04-21 NOTE — MAU Provider Note (Signed)
History     CSN: [id number]  Arrival date and time: 04/21/14 1125   First Provider Initiated Contact with Patient 04/21/14 1226      Chief Complaint  Patient presents with  . vaginal odor    HPI  Tina Fitzgerald is a 43 y.o. [id number] who presents today with vaginal odor. She states that she noticed it this morning after getting out of the shower. She states that she has frequent BV infections, and is prone to getting all types of infections. She states that she checked her string today, and it felt normal, but she though she felt something else too.   Past Medical History  Diagnosis Date  . Endocarditis     a. 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  Marland Kitchen PFO (patent foramen ovale)     a. s/p closure in 2007.  Marland Kitchen Hepatitis B carrier   . Tobacco abuse   . Asthma   . Hepatitis C   . Cardiac tamponade     a. s/p subxiphoid window 2007 in setting of TV endocarditis. b. She had suffered a mild anoxic injury during her recent hospitalization felt to be related to hypoperfusion [name] 06/15/2006 (05/09/2013)  . Cardiac arrest     a. 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  . Memory loss, short term     "since OHS in 2007" (05/09/2013)  . GERD (gastroesophageal reflux disease)   . Arthritis     "knees" (05/09/2013)  . Anxiety   . Depression   . ARF (acute renal failure)     a. 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  . Septic pulmonary embolism     a. During admission for TV endocarditis.  . Severe tricuspid regurgitation     a. By TEE/cath 04/2013 - for outpt referral to [facility].   Marland Kitchen History of tricuspid valve disorder 04/16/2013  . Chronic  headaches   . Hx: UTI (urinary tract infection)     Past Surgical History  Procedure Laterality Date  . Tricuspid valve surgery  04/22/2006    debridement of septal leaflet for MSSA bacterial endocarditis with closure PFO  . Patent foramen ovale closure  04/22/2006    closed during tricuspid valve surgery  . Cardiac catheterization      multiple/notes 06/15/2006 (05/09/2013)  . Subxyphoid pericardial window  05/07/2006    for large postoperative pericardial effusion   . Dilation and curettage of uterus      "couple of those years ago; one a couple years ago after I lost a pregnancy" (05/09/2013)  . Tee without cardioversion N/A 05/08/2013    Procedure: TRANSESOPHAGEAL ECHOCARDIOGRAM (TEE);  Surgeon: Lewayne Bunting, MD;  Location: [facility] ENDOSCOPY;  Service: Cardiovascular;  Laterality: N/A;    Family History  Problem Relation Age of Onset  . Hyperlipidemia Mother   . Hyperlipidemia Brother   . Heart disease Maternal Grandmother   . Alcohol abuse Father   . Arthritis Father   . Drug abuse Father   . Diabetes Daughter   . Ovarian cancer Cousin   . Mental illness Other   . Cancer Neg Hx   . Early death Neg Hx   . Hypertension Neg Hx   . Kidney disease  Neg Hx   . Learning disabilities Neg Hx   . Stroke Neg Hx     History  Substance Use Topics  . Smoking status: Current Every Day Smoker -- 0.75 packs/day for 30 years    Types: Cigarettes  . Smokeless tobacco: Never Used  . Alcohol Use: 4.2 oz/week    7 Cans of beer per week    Allergies:  Allergies  Allergen Reactions  . Amoxicillin Diarrhea and Other (See Comments)    " I get very sick with this medication"  . Latex Rash    " I'm allergic to latex, and it breaks by skin out in an itchy rash"    Prescriptions prior to admission  Medication Sig Dispense Refill  . Ascorbic Acid (VITAMIN C WITH ROSE HIPS) 500 MG tablet Take 1 tablet (500 mg total) by mouth daily.  90 tablet  3  . famotidine (PEPCID) 20 MG tablet Take 20 mg by  mouth daily.      Marland Kitchen. ibuprofen (ADVIL,MOTRIN) 200 MG tablet Take 800 mg by mouth every 6 (six) hours as needed for moderate pain.      . Multiple Vitamins-Calcium (ONE-A-DAY WOMENS FORMULA PO) Take 1 tablet by mouth daily.      . Omega-3 Fatty Acids (FISH OIL) 1000 MG CAPS Take 2,000 mg by mouth 2 (two) times daily.      . potassium gluconate 595 MG TABS tablet Take 1 tablet (595 mg total) by mouth every other day.  90 tablet  3  . sertraline (ZOLOFT) 100 MG tablet Take 1 tablet (100 mg total) by mouth daily.  30 tablet  5  . Tiotropium Bromide Monohydrate (SPIRIVA RESPIMAT) 2.5 MCG/ACT AERS Inhale 2 puffs into the lungs daily.  4 g  11  . zinc gluconate 50 MG tablet Take 50 mg by mouth every 7 (seven) days. On Sundays      . levonorgestrel (MIRENA) 20 MCG/24HR IUD 1 Intra Uterine Device (1 each total) by Intrauterine route once.  1 each  0    ROS Physical Exam   Blood pressure 123/81, pulse 63, temperature 98.1 F (36.7 C), temperature source Oral, resp. rate 16, height 5' 5.5" (1.664 m), weight 69.514 kg (153 lb 4 oz), last menstrual period 04/16/2014.  Physical Exam  Nursing note and vitals reviewed. Constitutional: She is oriented to person, place, and time. She appears well-developed and well-nourished. No distress.  Cardiovascular: Normal rate.   Respiratory: Effort normal.  GI: Soft. There is no tenderness. There is no rebound.  Genitourinary:  External: no lesion Vagina: small amount of white discharge Cervix: pink, smooth, no CMT. IUD tail seen at the os.  Uterus: NSSC Adnexa: NT   Neurological: She is alert and oriented to person, place, and time.  Skin: Skin is warm and dry.  Psychiatric: She has a normal mood and affect.    MAU Course  Procedures  Results for orders placed during the hospital encounter of 04/21/14 (from the past 24 hour(s))  WET PREP, GENITAL     Status: Abnormal   Collection Time    04/21/14 12:35 PM      Result Value Ref Range   Yeast Wet Prep  HPF POC NONE SEEN  NONE SEEN   Trich, Wet Prep NONE SEEN  NONE SEEN   Clue Cells Wet Prep HPF POC FEW (*) NONE SEEN   WBC, Wet Prep HPF POC FEW (*) NONE SEEN    Assessment and Plan   1. BV (bacterial vaginosis)  rx flagyl 500mg  BID x 7 FU with the clinic as planned  Return to MAU as needed   Follow-up Information   [facility] with [facility]. (As scheduled for 04/29/14 )    Specialty:  Obstetrics and Gynecology   Contact information:   [address] [place] [address] [phone]       Tawnya CrookHogan, Heather Donovan 04/21/2014, 1:08 PM

## 2014-04-21 NOTE — MAU Note (Signed)
Patient presents with complaint of vaginal odor since last night.

## 2014-04-22 LAB — HEPATITIS C RNA QUANTITATIVE: HCV Quantitative: NOT DETECTED IU/mL (ref <15)

## 2014-04-22 LAB — GC/CHLAMYDIA PROBE AMP
CT Probe RNA: NEGATIVE
GC PROBE AMP APTIMA: NEGATIVE

## 2014-04-23 ENCOUNTER — Encounter: Payer: Self-pay | Admitting: Internal Medicine

## 2014-04-25 ENCOUNTER — Encounter: Payer: Self-pay | Admitting: Internal Medicine

## 2014-04-26 ENCOUNTER — Telehealth: Payer: Self-pay | Admitting: *Deleted

## 2014-04-26 NOTE — Telephone Encounter (Addendum)
Pt left message stating that Dr. Jolayne Pantheronstant inserted IUD a few weeks ago. She is having heavy bleeding and passing clots - not sure if this is normal. Please call back. **Note: pt has follow up appt on 6/29.  I called pt back and discussed her concern. She stated that she had a light period at the beginning of the month and then continued to have spotting almost daily. Last night she began having very heavy bleeding and is sometimes having to change her super tampon every 2 hrs. She is passing clots the size of the tip of her index finger. She denies pain or cramping of any sort and reports this is a change that she is very happy about. I explained to pt that it can take up to 6 months for her body to become fully adjusted to the IUD. During that time she may have irregular periods which can be heavy and/or more frequent than once per month. I stated that if she becomes weak or dizzy she should go to MAU for evaluation. Otherwise, discuss this occurrence with Dr. Jolayne Pantheronstant at her clinic visit on 6/29. Pt voiced understanding and was very relieved that there is nothing to worry about. She will keep appt as scheduled.

## 2014-04-29 ENCOUNTER — Encounter: Payer: Self-pay | Admitting: Cardiovascular Disease

## 2014-04-29 ENCOUNTER — Ambulatory Visit (INDEPENDENT_AMBULATORY_CARE_PROVIDER_SITE_OTHER): Payer: Medicare Other | Admitting: Obstetrics and Gynecology

## 2014-04-29 ENCOUNTER — Encounter: Payer: Self-pay | Admitting: Obstetrics and Gynecology

## 2014-04-29 VITALS — BP 108/72 | HR 74 | Temp 98.2°F | Ht 65.0 in | Wt 161.3 lb

## 2014-04-29 DIAGNOSIS — Z30432 Encounter for removal of intrauterine contraceptive device: Secondary | ICD-10-CM

## 2014-04-29 DIAGNOSIS — Z30431 Encounter for routine checking of intrauterine contraceptive device: Secondary | ICD-10-CM

## 2014-04-29 NOTE — Patient Instructions (Signed)
Contraception Choices Contraception (birth control) is the use of any methods or devices to prevent pregnancy. Below are some methods to help avoid pregnancy. HORMONAL METHODS   Contraceptive implant. This is a thin, plastic tube containing progesterone hormone. It does not contain estrogen hormone. Your health care provider inserts the tube in the inner part of the upper arm. The tube can remain in place for up to 3 years. After 3 years, the implant must be removed. The implant prevents the ovaries from releasing an egg (ovulation), thickens the cervical mucus to prevent sperm from entering the uterus, and thins the lining of the inside of the uterus.  Progesterone-only injections. These injections are given every 3 months by your health care provider to prevent pregnancy. This synthetic progesterone hormone stops the ovaries from releasing eggs. It also thickens cervical mucus and changes the uterine lining. This makes it harder for sperm to survive in the uterus.  Birth control pills. These pills contain estrogen and progesterone hormone. They work by preventing the ovaries from releasing eggs (ovulation). They also cause the cervical mucus to thicken, preventing the sperm from entering the uterus. Birth control pills are prescribed by a health care provider.Birth control pills can also be used to treat heavy periods.  Minipill. This type of birth control pill contains only the progesterone hormone. They are taken every day of each month and must be prescribed by your health care provider.  Birth control patch. The patch contains hormones similar to those in birth control pills. It must be changed once a week and is prescribed by a health care provider.  Vaginal ring. The ring contains hormones similar to those in birth control pills. It is left in the vagina for 3 weeks, removed for 1 week, and then a new one is put back in place. The patient must be comfortable inserting and removing the ring  from the vagina.A health care provider's prescription is necessary.  Emergency contraception. Emergency contraceptives prevent pregnancy after unprotected sexual intercourse. This pill can be taken right after sex or up to 5 days after unprotected sex. It is most effective the sooner you take the pills after having sexual intercourse. Most emergency contraceptive pills are available without a prescription. Check with your pharmacist. Do not use emergency contraception as your only form of birth control. BARRIER METHODS   Female condom. This is a thin sheath (latex or rubber) that is worn over the penis during sexual intercourse. It can be used with spermicide to increase effectiveness.  Female condom. This is a soft, loose-fitting sheath that is put into the vagina before sexual intercourse.  Diaphragm. This is a soft, latex, dome-shaped barrier that must be fitted by a health care provider. It is inserted into the vagina, along with a spermicidal jelly. It is inserted before intercourse. The diaphragm should be left in the vagina for 6 to 8 hours after intercourse.  Cervical cap. This is a round, soft, latex or plastic cup that fits over the cervix and must be fitted by a health care provider. The cap can be left in place for up to 48 hours after intercourse.  Sponge. This is a soft, circular piece of polyurethane foam. The sponge has spermicide in it. It is inserted into the vagina after wetting it and before sexual intercourse.  Spermicides. These are chemicals that kill or block sperm from entering the cervix and uterus. They come in the form of creams, jellies, suppositories, foam, or tablets. They do not require a   prescription. They are inserted into the vagina with an applicator before having sexual intercourse. The process must be repeated every time you have sexual intercourse. INTRAUTERINE CONTRACEPTION  Intrauterine device (IUD). This is a T-shaped device that is put in a woman's uterus  during a menstrual period to prevent pregnancy. There are 2 types:  Copper IUD. This type of IUD is wrapped in copper wire and is placed inside the uterus. Copper makes the uterus and fallopian tubes produce a fluid that kills sperm. It can stay in place for 10 years.  Hormone IUD. This type of IUD contains the hormone progestin (synthetic progesterone). The hormone thickens the cervical mucus and prevents sperm from entering the uterus, and it also thins the uterine lining to prevent implantation of a fertilized egg. The hormone can weaken or kill the sperm that get into the uterus. It can stay in place for 3-5 years, depending on which type of IUD is used. PERMANENT METHODS OF CONTRACEPTION  Female tubal ligation. This is when the woman's fallopian tubes are surgically sealed, tied, or blocked to prevent the egg from traveling to the uterus.  Hysteroscopic sterilization. This involves placing a small coil or insert into each fallopian tube. Your doctor uses a technique called hysteroscopy to do the procedure. The device causes scar tissue to form. This results in permanent blockage of the fallopian tubes, so the sperm cannot fertilize the egg. It takes about 3 months after the procedure for the tubes to become blocked. You must use another form of birth control for these 3 months.  Female sterilization. This is when the female has the tubes that carry sperm tied off (vasectomy).This blocks sperm from entering the vagina during sexual intercourse. After the procedure, the man can still ejaculate fluid (semen). NATURAL PLANNING METHODS  Natural family planning. This is not having sexual intercourse or using a barrier method (condom, diaphragm, cervical cap) on days the woman could become pregnant.  Calendar method. This is keeping track of the length of each menstrual cycle and identifying when you are fertile.  Ovulation method. This is avoiding sexual intercourse during ovulation.  Symptothermal  method. This is avoiding sexual intercourse during ovulation, using a thermometer and ovulation symptoms.  Post-ovulation method. This is timing sexual intercourse after you have ovulated. Regardless of which type or method of contraception you choose, it is important that you use condoms to protect against the transmission of sexually transmitted infections (STIs). Talk with your health care provider about which form of contraception is most appropriate for you. Document Released: 10/18/2005 Document Revised: 10/23/2013 Document Reviewed: 04/12/2013 ExitCare Patient Information 2015 [employer]. This information is not intended to replace advice given to you by your health care provider. Make sure you discuss any questions you have with your health care provider.  

## 2014-04-29 NOTE — Progress Notes (Signed)
Patient ID: Tina Fitzgerald, female   DOB: October 13, 1971, 43 y.o.   MRN: [id number] [age] yo here for IUD check. Patient had IUD inserted on 04/01/2014 and reports heavy bleeding since placement. She desires to have IUD removed as she has concerns that it may have moved  GENERAL: Well-developed, well-nourished female in no acute distress.  ABDOMEN: Soft, nontender, nondistended. No organomegaly. PELVIC: Normal external female genitalia. Vagina is pink and rugated.  Normal discharge. Normal appearing cervix. IUD visualized in posterior fornix. Uterus is normal in size. No adnexal mass or tenderness. EXTREMITIES: No cyanosis, clubbing, or edema, 2+ distal pulses.  A/P 43 yo here for IUD check - IUD removed - Patient desires to use condoms for now - RTC in 1 year or prn

## 2014-04-30 ENCOUNTER — Ambulatory Visit
Admission: RE | Admit: 2014-04-30 | Discharge: 2014-04-30 | Disposition: A | Discharge status: Home / Self Care | Payer: Medicare Other | Source: Ambulatory Visit | Attending: Internal Medicine | Admitting: Internal Medicine

## 2014-04-30 ENCOUNTER — Encounter: Payer: Self-pay | Admitting: Internal Medicine

## 2014-04-30 DIAGNOSIS — B182 Chronic viral hepatitis C: Secondary | ICD-10-CM

## 2014-05-01 ENCOUNTER — Encounter: Payer: Self-pay | Admitting: Obstetrics and Gynecology

## 2014-05-26 ENCOUNTER — Encounter: Payer: Self-pay | Admitting: Internal Medicine

## 2014-06-04 ENCOUNTER — Encounter: Payer: Self-pay | Admitting: Internal Medicine

## 2014-06-13 ENCOUNTER — Encounter: Payer: Self-pay | Admitting: Internal Medicine

## 2014-06-21 ENCOUNTER — Encounter: Payer: Self-pay | Admitting: Internal Medicine

## 2014-06-22 MED ORDER — TIOTROPIUM BROMIDE MONOHYDRATE 2.5 MCG/ACT IN AERS: 2.0000 | INHALATION_SPRAY | Freq: Every day | RESPIRATORY_TRACT | Status: DC

## 2014-06-25 ENCOUNTER — Encounter: Payer: Self-pay | Admitting: General Practice

## 2014-08-01 ENCOUNTER — Encounter: Payer: Self-pay | Admitting: Cardiovascular Disease

## 2014-08-08 ENCOUNTER — Ambulatory Visit (HOSPITAL_COMMUNITY): Payer: Medicare Other

## 2014-08-22 ENCOUNTER — Ambulatory Visit (INDEPENDENT_AMBULATORY_CARE_PROVIDER_SITE_OTHER): Payer: Medicare Other | Admitting: Cardiovascular Disease

## 2014-08-22 ENCOUNTER — Ambulatory Visit (HOSPITAL_COMMUNITY): Payer: Medicare Other | Attending: Cardiology | Admitting: Radiology

## 2014-08-22 ENCOUNTER — Encounter: Payer: Self-pay | Admitting: Cardiovascular Disease

## 2014-08-22 VITALS — BP 122/94 | HR 66 | Ht 65.0 in | Wt 170.1 lb

## 2014-08-22 DIAGNOSIS — J449 Chronic obstructive pulmonary disease, unspecified: Secondary | ICD-10-CM

## 2014-08-22 DIAGNOSIS — Z720 Tobacco use: Secondary | ICD-10-CM

## 2014-08-22 DIAGNOSIS — I071 Rheumatic tricuspid insufficiency: Secondary | ICD-10-CM | POA: Insufficient documentation

## 2014-08-22 DIAGNOSIS — Z23 Encounter for immunization: Secondary | ICD-10-CM

## 2014-08-22 NOTE — Patient Instructions (Signed)
Your physician wants you to follow-up in:  12 months.  You will receive a reminder letter in the mail two months in advance. If you don't receive a letter, please call our office to schedule the follow-up appointment.   

## 2014-08-22 NOTE — Progress Notes (Signed)
History of Present Illness: 43 yo female with history of TV endocarditis, former polysubstance abuse, Hepatitis B and C, PFO s/p closure here today for cardiac follow up. I saw her 04/16/13 to re-establish cardiology care. She had been followed in the past in our [place] office by Dr. Freida Busman. 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 At the visit in our office 04/16/13, she c/o dizziness, SOB, fatigue, flu-like symptoms.  I arranged an echo 04/23/13 which showed LVEF=45%, dilated RV and RA, severe TR. Cardiac cath 05/08/13 with no evidence of CAD. TEE confirmed severe TR. PFTS with severe COPD. She was seen by Dr. Cornelius Moras and continued conservative therapy was recommended. She is still smoking 3/4 ppd.   She is here today for follow up. She feels great. No LE edema. No chest pain. She is much more relaxed. Anxiety level has been lower.   Primary Care Physician: Dr. Anner Crete [facility])  Past Medical History  Diagnosis Date  . Endocarditis     a. 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  Marland Kitchen PFO (patent foramen ovale)     a. s/p closure in 2007.  Marland Kitchen Hepatitis B carrier   . Tobacco abuse   . Asthma   . Hepatitis C   . Cardiac tamponade     a. s/p subxiphoid window 2007 in setting of TV endocarditis. b. She had suffered a mild anoxic injury during her recent hospitalization felt to be related to hypoperfusion [name] 06/15/2006 (05/09/2013)  . Cardiac arrest     a. 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  . Memory loss, short term     "since OHS in  2007" (05/09/2013)  . GERD (gastroesophageal reflux disease)   . Arthritis     "knees" (05/09/2013)  . Anxiety   . Depression   . ARF (acute renal failure)     a. 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  . Septic pulmonary embolism     a. During admission for TV endocarditis.  . Severe tricuspid regurgitation     a. By TEE/cath 04/2013 - for outpt referral to [facility].   Marland Kitchen History of tricuspid valve disorder 04/16/2013  . Chronic headaches   . Hx: UTI (urinary tract infection)     Past Surgical History  Procedure Laterality Date  . Tricuspid valve surgery  04/22/2006    debridement of septal leaflet for MSSA bacterial endocarditis with closure PFO  . Patent foramen ovale closure  04/22/2006    closed during tricuspid valve surgery  . Cardiac catheterization      multiple/notes 06/15/2006 (05/09/2013)  . Subxyphoid pericardial window  05/07/2006    for large postoperative pericardial effusion   . Dilation and curettage of uterus      "couple of those years ago; one a couple years ago after I lost a pregnancy" (05/09/2013)  . Tee without cardioversion N/A 05/08/2013    Procedure: TRANSESOPHAGEAL ECHOCARDIOGRAM (TEE);  Surgeon: Lewayne Bunting, MD;  Location: [facility] ENDOSCOPY;  Service: Cardiovascular;  Laterality: N/A;    Current Outpatient Prescriptions  Medication Sig Dispense Refill  . Ascorbic Acid (  VITAMIN C WITH ROSE HIPS) 500 MG tablet Take 1 tablet (500 mg total) by mouth daily.  90 tablet  3  . famotidine (PEPCID) 20 MG tablet Take 20 mg by mouth daily.      Marland Kitchen. ibuprofen (ADVIL,MOTRIN) 200 MG tablet Take 800 mg by mouth every 6 (six) hours as needed for moderate pain.      . Multiple Vitamins-Calcium (ONE-A-DAY WOMENS FORMULA PO) Take 1 tablet by mouth daily.      . Omega-3 Fatty Acids (FISH OIL) 1000 MG CAPS Take 2,000 mg by mouth 3 (three) times daily.       . potassium gluconate 595 MG TABS tablet Take 1 tablet (595 mg total) by mouth every other day.  90  tablet  3  . sertraline (ZOLOFT) 100 MG tablet Take 1 tablet (100 mg total) by mouth daily.  30 tablet  5  . Tiotropium Bromide Monohydrate (SPIRIVA RESPIMAT) 2.5 MCG/ACT AERS Inhale 2 puffs into the lungs daily.  3 Inhaler  4  . zinc gluconate 50 MG tablet Take 50 mg by mouth every 7 (seven) days. On Sundays       No current facility-administered medications for this visit.    Allergies  Allergen Reactions  . Amoxicillin Diarrhea and Other (See Comments)    " I get very sick with this medication"  . Penicillins   . Latex Rash    " I'm allergic to latex, and it breaks by skin out in an itchy rash"    History   Social History  . Marital Status: Legally Separated    Spouse Name: N/A    Number of Children: 6  . Years of Education: N/A   Occupational History  . disabled    Social History Main Topics  . Smoking status: Current Every Day Smoker -- 0.75 packs/day for 30 years    Types: Cigarettes  . Smokeless tobacco: Never Used  . Alcohol Use: 4.2 oz/week    7 Cans of beer per week  . Drug Use: Yes    Special: Marijuana, Heroin, "Crack" cocaine, Cocaine     Comment: 05/09/2013 "<1 joint/day; last heroin & crack was before heart OR in 2007"  . Sexual Activity: Yes    Birth Control/ Protection: Condom, IUD   Other Topics Concern  . Not on file   Social History Narrative  . No narrative on file    Family History  Problem Relation Age of Onset  . Hyperlipidemia Mother   . Hyperlipidemia Brother   . Heart disease Maternal Grandmother   . Alcohol abuse Father   . Arthritis Father   . Drug abuse Father   . Diabetes Daughter   . Ovarian cancer Cousin   . Mental illness Other   . Cancer Neg Hx   . Early death Neg Hx   . Hypertension Neg Hx   . Kidney disease Neg Hx   . Learning disabilities Neg Hx   . Stroke Neg Hx     Review of Systems:  As stated in the HPI and otherwise negative.   BP 122/94  Pulse 66  Ht 5\' 5"  (1.651 m)  Wt 170 lb 1.9 oz (77.166 kg)  BMI  28.31 kg/m2  Physical Examination: General: Well developed, well nourished, NAD HEENT: OP clear, mucus membranes moist SKIN: warm, dry. No rashes. Neuro: No focal deficits Musculoskeletal: Muscle strength 5/5 all ext Psychiatric: Mood and affect normal Neck: No JVD, no carotid bruits, no thyromegaly, no lymphadenopathy. Lungs:Clear bilaterally, no  wheezes, rhonci, crackles Cardiovascular: Regular rate and rhythm with systolic murmur. No gallops or rubs. Abdomen:Soft. Bowel sounds present. Non-tender.  Extremities: No lower extremity edema. Pulses are 2 + in the bilateral DP/PT.  Echo 04/23/13: Left ventricle: The cavity size was normal. Wall thickness was normal. The estimated ejection fraction was 45%. Diffuse hypokinesis. - Right ventricle: The cavity size was severely dilated. - Right atrium: The atrium was moderately dilated. - Atrial septum: No defect or patent foramen ovale was identified. - Tricuspid valve: Etiology of the severe TR is not apparent Severe regurgitation.  Cardiac cath 05/08/13:  Hemodynamic Findings:  Ao: 152/87  LV: 153/5/12  RA: 12  RV: 29/8/12  PA: 26/5 (mean 16)  PCWP: 8  Fick Cardiac Output: 3.86 L/min  Fick Cardiac Index: 2.08 L/min/m2  Central Aortic Saturation: 96%  Pulmonary Artery Saturation: 66%  Angiographic Findings:  Left main: No obstructive disease.  Left Anterior Descending Artery:Large caliber vessel that courses to the apex. Several small caliber diagonal branches. No obstructive disease.  Circumflex Artery: Moderate caliber vessel with early obtuse marginal branch. No obstructive disease.  Right Coronary Artery: Large dominant vessel with no obstructive disease.  Left Ventricular Angiogram: LVEF=45% with global hypokinesis.  Impression:  1. No angiographic evidence of CAD  2. Mild global LV systolic dysfunction  3. Known severe tricuspid valve regurgitation  Recommendations: Will review findings of TEE this am and plan referral  to see Dr. Cornelius Moraswen in [place] surgery office for evaluation of her tricuspid valve. Dr. Cornelius Moraswen performed her tricuspid valve procedure in 2007.   TEE 05/08/13:  Left ventricle: Systolic function was normal. The estimated ejection fraction was in the range of 50% to 55%. Hypokinesis of the distalanteroseptal myocardium. - Aortic valve: No evidence of vegetation. - Mitral valve: No evidence of vegetation. - Right ventricle: The cavity size was moderately dilated. Systolic function was mildly reduced. - Right atrium: The atrium was moderately dilated. - Atrial septum: No defect or patent foramen ovale was identified. There was an atrial septal aneurysm. - Tricuspid valve: Mobility of the septal leaflet was restricted. No evidence of vegetation. Wide-open regurgitation. - Pulmonic valve: No evidence of vegetation.  Assessment and Plan:   1. Tricuspid Valve regurgitation: Severe by echo 04/23/13 and confirmed by TEE 05/08/13. Cardiac cath with no CAD. She has been evaluated by Dr. Cornelius Moraswen with CT surgery in 2014. Since she has no signs of right sided heart failure, no indication for tricuspid valve repair at this time. She is doing well. Will continue to follow. Will repeat echo today.   2. COPD: Followed in primary care.    3. Tobacco abuse: Smoking cessation recommended. She wishes to stop.

## 2014-08-22 NOTE — Progress Notes (Signed)
Echocardiogram performed.  

## 2014-08-23 ENCOUNTER — Encounter: Payer: Self-pay | Admitting: Internal Medicine

## 2014-08-26 ENCOUNTER — Telehealth: Payer: Self-pay | Admitting: Cardiovascular Disease

## 2014-08-26 NOTE — Telephone Encounter (Signed)
Spoke with pt and reviewed echo results with her.  

## 2014-08-26 NOTE — Telephone Encounter (Signed)
New message     Returning a nurses call from this am.  OK to leave a message.  Sometimes her phone does not ring because of poor reception

## 2014-08-29 ENCOUNTER — Ambulatory Visit: Payer: Self-pay

## 2014-09-02 ENCOUNTER — Encounter: Payer: Self-pay | Admitting: Cardiovascular Disease

## 2014-10-10 ENCOUNTER — Encounter (HOSPITAL_COMMUNITY): Payer: Self-pay | Admitting: Cardiovascular Disease

## 2014-12-21 ENCOUNTER — Emergency Department (HOSPITAL_COMMUNITY)
Admission: EM | Admit: 2014-12-21 | Discharge: 2014-12-21 | Disposition: A | Discharge status: Home / Self Care | Payer: Medicare Other | Attending: Emergency Medicine | Admitting: Emergency Medicine

## 2014-12-21 ENCOUNTER — Encounter (HOSPITAL_COMMUNITY): Payer: Self-pay | Admitting: Family Medicine

## 2014-12-21 ENCOUNTER — Emergency Department (HOSPITAL_COMMUNITY): Payer: Medicare Other

## 2014-12-21 DIAGNOSIS — Q211 Atrial septal defect: Secondary | ICD-10-CM | POA: Diagnosis not present

## 2014-12-21 DIAGNOSIS — M79601 Pain in right arm: Secondary | ICD-10-CM | POA: Diagnosis not present

## 2014-12-21 DIAGNOSIS — Z91040 Latex allergy status: Secondary | ICD-10-CM | POA: Insufficient documentation

## 2014-12-21 DIAGNOSIS — M179 Osteoarthritis of knee, unspecified: Secondary | ICD-10-CM | POA: Insufficient documentation

## 2014-12-21 DIAGNOSIS — R5383 Other fatigue: Secondary | ICD-10-CM | POA: Insufficient documentation

## 2014-12-21 DIAGNOSIS — K219 Gastro-esophageal reflux disease without esophagitis: Secondary | ICD-10-CM | POA: Insufficient documentation

## 2014-12-21 DIAGNOSIS — Z9889 Other specified postprocedural states: Secondary | ICD-10-CM | POA: Insufficient documentation

## 2014-12-21 DIAGNOSIS — Z86711 Personal history of pulmonary embolism: Secondary | ICD-10-CM | POA: Diagnosis not present

## 2014-12-21 DIAGNOSIS — Z87440 Personal history of urinary (tract) infections: Secondary | ICD-10-CM | POA: Insufficient documentation

## 2014-12-21 DIAGNOSIS — Z880 Allergy status to penicillin: Secondary | ICD-10-CM | POA: Insufficient documentation

## 2014-12-21 DIAGNOSIS — I469 Cardiac arrest, cause unspecified: Secondary | ICD-10-CM | POA: Insufficient documentation

## 2014-12-21 DIAGNOSIS — G8929 Other chronic pain: Secondary | ICD-10-CM | POA: Insufficient documentation

## 2014-12-21 DIAGNOSIS — F419 Anxiety disorder, unspecified: Secondary | ICD-10-CM | POA: Insufficient documentation

## 2014-12-21 DIAGNOSIS — Z79899 Other long term (current) drug therapy: Secondary | ICD-10-CM | POA: Insufficient documentation

## 2014-12-21 DIAGNOSIS — Z720 Tobacco use: Secondary | ICD-10-CM | POA: Diagnosis not present

## 2014-12-21 DIAGNOSIS — Z87448 Personal history of other diseases of urinary system: Secondary | ICD-10-CM | POA: Diagnosis not present

## 2014-12-21 DIAGNOSIS — J45909 Unspecified asthma, uncomplicated: Secondary | ICD-10-CM | POA: Diagnosis not present

## 2014-12-21 DIAGNOSIS — R079 Chest pain, unspecified: Secondary | ICD-10-CM | POA: Diagnosis not present

## 2014-12-21 DIAGNOSIS — Z3202 Encounter for pregnancy test, result negative: Secondary | ICD-10-CM | POA: Insufficient documentation

## 2014-12-21 DIAGNOSIS — N390 Urinary tract infection, site not specified: Secondary | ICD-10-CM | POA: Diagnosis not present

## 2014-12-21 DIAGNOSIS — Z8619 Personal history of other infectious and parasitic diseases: Secondary | ICD-10-CM | POA: Diagnosis not present

## 2014-12-21 LAB — BASIC METABOLIC PANEL
Anion gap: 9 (ref 5–15)
BUN: 16 mg/dL (ref 6–23)
CALCIUM: 9.5 mg/dL (ref 8.4–10.5)
CO2: 21 mmol/L (ref 19–32)
CREATININE: 0.97 mg/dL (ref 0.50–1.10)
Chloride: 106 mmol/L (ref 96–112)
GFR calc Af Amer: 82 mL/min — ABNORMAL LOW (ref >90)
GFR calc non Af Amer: 71 mL/min — ABNORMAL LOW (ref >90)
Glucose, Bld: 89 mg/dL (ref 70–99)
POTASSIUM: 4.1 mmol/L (ref 3.5–5.1)
SODIUM: 136 mmol/L (ref 135–145)

## 2014-12-21 LAB — URINALYSIS, ROUTINE W REFLEX MICROSCOPIC
Bilirubin Urine: NEGATIVE
Glucose, UA: NEGATIVE mg/dL
Ketones, ur: 15 mg/dL — AB
Leukocytes, UA: NEGATIVE
NITRITE: NEGATIVE
PH: 5 (ref 5.0–8.0)
PROTEIN: NEGATIVE mg/dL
SPECIFIC GRAVITY, URINE: 1.026 (ref 1.005–1.030)
Urobilinogen, UA: 0.2 mg/dL (ref 0.0–1.0)

## 2014-12-21 LAB — CBC
HCT: 46.1 % — ABNORMAL HIGH (ref 36.0–46.0)
Hemoglobin: 15.9 g/dL — ABNORMAL HIGH (ref 12.0–15.0)
MCH: 31.9 pg (ref 26.0–34.0)
MCHC: 34.5 g/dL (ref 30.0–36.0)
MCV: 92.6 fL (ref 78.0–100.0)
PLATELETS: 201 10*3/uL (ref 150–400)
RBC: 4.98 MIL/uL (ref 3.87–5.11)
RDW: 13 % (ref 11.5–15.5)
WBC: 9.9 10*3/uL (ref 4.0–10.5)

## 2014-12-21 LAB — URINE MICROSCOPIC-ADD ON

## 2014-12-21 LAB — POC URINE PREG, ED: PREG TEST UR: NEGATIVE

## 2014-12-21 LAB — I-STAT TROPONIN, ED: Troponin i, poc: 0 ng/mL (ref 0.00–0.08)

## 2014-12-21 MED ORDER — CEPHALEXIN 500 MG PO CAPS: 500.0000 mg | ORAL_CAPSULE | Freq: Three times a day (TID) | ORAL | Status: AC

## 2014-12-21 MED ORDER — FLUCONAZOLE 150 MG PO TABS: 150.0000 mg | ORAL_TABLET | Freq: Once | ORAL | Status: DC

## 2014-12-21 NOTE — ED Notes (Signed)
Pt here for arm pain, and weakness. Pt sig cardiac hx.

## 2014-12-21 NOTE — ED Provider Notes (Signed)
CSN: [id number]     Arrival date & time 12/21/14  1447 History   First MD Initiated Contact with Patient 12/21/14 1520     Chief Complaint  Patient presents with  . Arm Pain  . Chest Pain     (Consider location/radiation/quality/duration/timing/severity/associated sxs/prior Treatment) Patient is a 44 y.o. female presenting with arm pain and chest pain.  Arm Pain This is a new problem. The current episode started yesterday. Episode frequency: intermittent. The problem has been resolved. Associated symptoms include chest pain. Pertinent negatives include no abdominal pain, no headaches and no shortness of breath. Nothing aggravates the symptoms. The symptoms are relieved by NSAIDs. Treatments tried: ibuprofen. The treatment provided significant relief.  Chest Pain Pain location:  Substernal area Pain quality: sharp   Pain radiates to:  Does not radiate Pain radiates to the back: no   Pain severity:  Mild Onset quality:  Sudden Timing:  Intermittent Progression:  Resolved Chronicity:  Chronic Context: at rest   Relieved by:  Nothing Worsened by:  Nothing tried Ineffective treatments:  None tried Associated symptoms: fatigue   Associated symptoms: no abdominal pain, no back pain, no cough, no dizziness, no fever, no headache, no nausea, no shortness of breath and not vomiting     Past Medical History  Diagnosis Date  . Endocarditis     a. 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  Marland Kitchen PFO (patent foramen ovale)     a. s/p closure in 2007.  Marland Kitchen Hepatitis B carrier   . Tobacco abuse   . Asthma   . Hepatitis C   . Cardiac tamponade     a. s/p subxiphoid window 2007 in setting of TV endocarditis. b.  She had suffered a mild anoxic injury during her recent hospitalization felt to be related to hypoperfusion [name] 06/15/2006 (05/09/2013)  . Cardiac arrest     a. 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  . Memory loss, short term     "since OHS in 2007" (05/09/2013)  . GERD (gastroesophageal reflux disease)   . Arthritis     "knees" (05/09/2013)  . Anxiety   . Depression   . ARF (acute renal failure)     a. 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  . Septic pulmonary embolism     a. During admission for TV endocarditis.  . Severe tricuspid regurgitation     a. By TEE/cath 04/2013 - for outpt referral to [facility].   Marland Kitchen History of tricuspid valve disorder 04/16/2013  . Chronic headaches   . Hx: UTI (urinary tract infection)    Past Surgical History  Procedure Laterality Date  . Tricuspid valve surgery  04/22/2006    debridement of septal leaflet for MSSA bacterial endocarditis with closure PFO  . Patent foramen ovale closure  04/22/2006    closed during tricuspid valve surgery  . Cardiac catheterization      multiple/notes 06/15/2006 (05/09/2013)  . Subxyphoid pericardial window  05/07/2006    for large postoperative pericardial effusion   . Dilation and curettage of uterus      "couple of those years ago; one a couple years ago after I lost a pregnancy" (05/09/2013)  . Tee without cardioversion N/A 05/08/2013  Procedure: TRANSESOPHAGEAL ECHOCARDIOGRAM (TEE);  Surgeon: Lewayne Bunting, MD;  Location: [facility] ENDOSCOPY;  Service: Cardiovascular;  Laterality: N/A;  . Left and right heart catheterization with coronary angiogram N/A 05/08/2013    Procedure: LEFT AND RIGHT HEART CATHETERIZATION WITH CORONARY ANGIOGRAM;  Surgeon: Kathleene Hazel, MD;  Location: [facility] CATH LAB;  Service: Cardiovascular;  Laterality: N/A;   Family History  Problem Relation Age of Onset  . Hyperlipidemia Mother   . Hyperlipidemia Brother   . Heart disease  Maternal Grandmother   . Alcohol abuse Father   . Arthritis Father   . Drug abuse Father   . Diabetes Daughter   . Ovarian cancer Cousin   . Mental illness Other   . Cancer Neg Hx   . Early death Neg Hx   . Hypertension Neg Hx   . Kidney disease Neg Hx   . Learning disabilities Neg Hx   . Stroke Neg Hx    History  Substance Use Topics  . Smoking status: Current Every Day Smoker -- 0.75 packs/day for 30 years    Types: Cigarettes  . Smokeless tobacco: Never Used  . Alcohol Use: 4.2 oz/week    7 Cans of beer per week   OB History    Gravida Para Term Preterm AB TAB SAB Ectopic Multiple Living   6 5 5  1  1  1 6      Review of Systems  Constitutional: Positive for fatigue. Negative for fever.  HENT: Negative for congestion and drooling.   Eyes: Negative for pain.  Respiratory: Negative for cough and shortness of breath.   Cardiovascular: Positive for chest pain.  Gastrointestinal: Negative for nausea, vomiting, abdominal pain and diarrhea.  Genitourinary: Negative for dysuria and hematuria.  Musculoskeletal: Negative for back pain, gait problem and neck pain.  Skin: Negative for color change.  Neurological: Negative for dizziness and headaches.  Hematological: Negative for adenopathy.  Psychiatric/Behavioral: Negative for behavioral problems.  All other systems reviewed and are negative.     Allergies  Amoxicillin; Penicillins; and Latex  Home Medications   Prior to Admission medications   Medication Sig Start Date End Date Taking? Authorizing Provider  Ascorbic Acid (VITAMIN C WITH ROSE HIPS) 500 MG tablet Take 1 tablet (500 mg total) by mouth daily. Patient taking differently: Take 1,000 mg by mouth daily.  02/13/14  Yes Newt Lukes, MD  diazepam (VALIUM) 10 MG tablet Take 10 mg by mouth 2 (two) times daily as needed for anxiety.   Yes Historical Provider, MD  famotidine (PEPCID) 20 MG tablet Take 20 mg by mouth daily.   Yes Historical Provider, MD   ibuprofen (ADVIL,MOTRIN) 200 MG tablet Take 800 mg by mouth every 6 (six) hours as needed for moderate pain.   Yes Historical Provider, MD  Multiple Vitamins-Calcium (ONE-A-DAY WOMENS FORMULA PO) Take 1 tablet by mouth daily.   Yes Historical Provider, MD  Omega-3 Fatty Acids (FISH OIL) 1000 MG CAPS Take 3,000 mg by mouth daily.    Yes Historical Provider, MD  potassium gluconate 595 MG TABS tablet Take 1 tablet (595 mg total) by mouth every other day. 02/13/14  Yes Newt Lukes, MD  sertraline (ZOLOFT) 100 MG tablet Take 1 tablet (100 mg total) by mouth daily. 01/16/14  Yes Baltazar Apo, PA-C  Tiotropium Bromide Monohydrate (SPIRIVA RESPIMAT) 2.5 MCG/ACT AERS Inhale 2 puffs into the lungs daily. 06/22/14  Yes Etta Grandchild, MD  zinc gluconate 50 MG tablet Take 50 mg by mouth  every 7 (seven) days. On Sundays   Yes Historical Provider, MD   BP 111/75 mmHg  Pulse 83  Temp(Src) 98.5 F (36.9 C)  Resp 18  SpO2 99%  LMP 12/02/2014 Physical Exam  Constitutional: She is oriented to person, place, and time. She appears well-developed and well-nourished.  HENT:  Head: Normocephalic.  Mouth/Throat: Oropharynx is clear and moist. No oropharyngeal exudate.  Eyes: Conjunctivae and EOM are normal. Pupils are equal, round, and reactive to light.  Neck: Normal range of motion. Neck supple.  Cardiovascular: Normal rate, regular rhythm, normal heart sounds and intact distal pulses.  Exam reveals no gallop and no friction rub.   No murmur heard. Pulmonary/Chest: Effort normal and breath sounds normal. No respiratory distress. She has no wheezes.  Abdominal: Soft. Bowel sounds are normal. There is no tenderness. There is no rebound and no guarding.  Musculoskeletal: Normal range of motion. She exhibits no edema or tenderness.  Normal strength and sensation in the upper extremities.  2+ and equal proximal and distal pulses in the upper extremities.  Normal range of motion of the right shoulder  without pain.  No focal tenderness of the upper extremities.  Neurological: She is alert and oriented to person, place, and time.  Skin: Skin is warm and dry.  Psychiatric: She has a normal mood and affect. Her behavior is normal.  Nursing note and vitals reviewed.   ED Course  Procedures (including critical care time) Labs Review Labs Reviewed  CBC - Abnormal; Notable for the following:    Hemoglobin 15.9 (*)    HCT 46.1 (*)    All other components within normal limits  BASIC METABOLIC PANEL  URINALYSIS, ROUTINE W REFLEX MICROSCOPIC  I-STAT TROPOININ, ED  POC URINE PREG, ED    Imaging Review Dg Chest 2 View  12/21/2014   CLINICAL DATA:  Left mid chest pain for the past 2 days. Smoker. Asthma. COPD.  EXAM: CHEST  2 VIEW  COMPARISON:  08/15/2009.  FINDINGS: The cardiac silhouette is near the upper limit of normal in size with a mild increase in size. Clear lungs with normal vascularity. Mild diffuse peribronchial thickening without significant change. Median sternotomy wires and mild scoliosis.  IMPRESSION: No acute abnormality.  Stable mild chronic bronchitic changes.   Electronically Signed   By: Beckie Salts M.D.   On: 12/21/2014 16:01     EKG Interpretation   Date/Time:  Saturday December 21 2014 14:53:46 EST Ventricular Rate:  82 PR Interval:  122 QRS Duration: 88 QT Interval:  386 QTC Calculation: 450 R Axis:   54 Text Interpretation:  Normal sinus rhythm Normal ECG No significant change  since last tracing Confirmed by Talayia Hjort  MD, Ramandeep Arington ([id number]) on 12/21/2014  3:33:03 PM      MDM   Final diagnoses:  UTI (lower urinary tract infection)  Right arm pain    4:24 PM 44 y.o. female who presents with right arm pain which began last night. She states that she is in the process of moving and has been cleaning the shower and her new house and moving boxes. She has taken NSAIDs with significant relief. She is asymptomatic currently. She also notes that she occasionally  has some sharp pains in the center of her chest which appear to be chronic. The last episode she had of this was last night. Of note she has a medical history significant for endocarditis requiring surgical debridement with asystolic cardiac arrest and peri-cardial tamponade with septic emboli back in  2007. She states that she has not abused drugs since that time. She is currently a smoker but denies any other drug use. She denies any fevers. She is afebrile and vital signs are unremarkable here. She has had no chest pain today. We'll get screening labs and imaging. She also notes increased stress from the move. I suspect the symptoms in her arm are likely musculoskeletal and were relieved by the ibuprofen she took earlier today.  6:35 PM: I interpreted/reviewed the labs and/or imaging which were non-contributory.  Pain atypical. Low risk for MACE per HEART score. Likely msk cause of pain. Recommended nsaids. Pt w/ few bact in UA w/ some dysuria and mild low back pain. Will cover for UTI.  I have discussed the diagnosis/risks/treatment options with the patient and believe the pt to be eligible for discharge home to follow-up with her pcp. We also discussed returning to the ED immediately if new or worsening sx occur. We discussed the sx which are most concerning (e.g., cp, sob, worsening arm pain) that necessitate immediate return. Medications administered to the patient during their visit and any new prescriptions provided to the patient are listed below.  Medications given during this visit Medications - No data to display  New Prescriptions   CEPHALEXIN (KEFLEX) 500 MG CAPSULE    Take 1 capsule (500 mg total) by mouth 3 (three) times daily.   FLUCONAZOLE (DIFLUCAN) 150 MG TABLET    Take 1 tablet (150 mg total) by mouth once.     Purvis SheffieldForrest Kaelani Kendrick, MD 12/21/14 [phone]

## 2015-03-14 ENCOUNTER — Other Ambulatory Visit (HOSPITAL_COMMUNITY): Payer: Self-pay | Admitting: Internal Medicine

## 2015-03-14 DIAGNOSIS — Z1231 Encounter for screening mammogram for malignant neoplasm of breast: Secondary | ICD-10-CM

## 2015-04-11 ENCOUNTER — Ambulatory Visit (HOSPITAL_COMMUNITY): Payer: Medicare Other

## 2015-07-12 ENCOUNTER — Encounter (HOSPITAL_COMMUNITY): Payer: Self-pay | Admitting: *Deleted

## 2015-07-12 DIAGNOSIS — M79602 Pain in left arm: Secondary | ICD-10-CM | POA: Insufficient documentation

## 2015-07-12 DIAGNOSIS — Z8674 Personal history of sudden cardiac arrest: Secondary | ICD-10-CM | POA: Insufficient documentation

## 2015-07-12 DIAGNOSIS — M1990 Unspecified osteoarthritis, unspecified site: Secondary | ICD-10-CM | POA: Insufficient documentation

## 2015-07-12 DIAGNOSIS — Z720 Tobacco use: Secondary | ICD-10-CM | POA: Insufficient documentation

## 2015-07-12 DIAGNOSIS — Z87440 Personal history of urinary (tract) infections: Secondary | ICD-10-CM | POA: Insufficient documentation

## 2015-07-12 DIAGNOSIS — F329 Major depressive disorder, single episode, unspecified: Secondary | ICD-10-CM | POA: Diagnosis not present

## 2015-07-12 DIAGNOSIS — Z8679 Personal history of other diseases of the circulatory system: Secondary | ICD-10-CM | POA: Insufficient documentation

## 2015-07-12 DIAGNOSIS — J45901 Unspecified asthma with (acute) exacerbation: Secondary | ICD-10-CM | POA: Diagnosis not present

## 2015-07-12 DIAGNOSIS — G8929 Other chronic pain: Secondary | ICD-10-CM | POA: Diagnosis not present

## 2015-07-12 DIAGNOSIS — Z79899 Other long term (current) drug therapy: Secondary | ICD-10-CM | POA: Diagnosis not present

## 2015-07-12 DIAGNOSIS — Z8619 Personal history of other infectious and parasitic diseases: Secondary | ICD-10-CM | POA: Insufficient documentation

## 2015-07-12 DIAGNOSIS — Z880 Allergy status to penicillin: Secondary | ICD-10-CM | POA: Insufficient documentation

## 2015-07-12 DIAGNOSIS — Z86711 Personal history of pulmonary embolism: Secondary | ICD-10-CM | POA: Diagnosis not present

## 2015-07-12 DIAGNOSIS — Z87448 Personal history of other diseases of urinary system: Secondary | ICD-10-CM | POA: Diagnosis not present

## 2015-07-12 DIAGNOSIS — R61 Generalized hyperhidrosis: Secondary | ICD-10-CM | POA: Insufficient documentation

## 2015-07-12 DIAGNOSIS — Z9889 Other specified postprocedural states: Secondary | ICD-10-CM | POA: Diagnosis not present

## 2015-07-12 DIAGNOSIS — R0789 Other chest pain: Secondary | ICD-10-CM | POA: Diagnosis not present

## 2015-07-12 DIAGNOSIS — Q211 Atrial septal defect: Secondary | ICD-10-CM | POA: Insufficient documentation

## 2015-07-12 DIAGNOSIS — F419 Anxiety disorder, unspecified: Secondary | ICD-10-CM | POA: Diagnosis not present

## 2015-07-12 DIAGNOSIS — Z91040 Latex allergy status: Secondary | ICD-10-CM | POA: Diagnosis not present

## 2015-07-12 DIAGNOSIS — R079 Chest pain, unspecified: Secondary | ICD-10-CM | POA: Diagnosis present

## 2015-07-12 NOTE — ED Notes (Signed)
The pt is c/o some chest discomfort and some lt arm pain for 3-4 days.  She feels very tired and has no appetite. She has also been sleeping more.  lmp last month

## 2015-07-13 ENCOUNTER — Emergency Department (HOSPITAL_COMMUNITY)
Admission: EM | Admit: 2015-07-13 | Discharge: 2015-07-13 | Disposition: A | Discharge status: Home / Self Care | Payer: Medicare Other | Attending: Emergency Medicine | Admitting: Emergency Medicine

## 2015-07-13 ENCOUNTER — Encounter (HOSPITAL_COMMUNITY): Payer: Self-pay

## 2015-07-13 ENCOUNTER — Emergency Department (HOSPITAL_COMMUNITY): Payer: Medicare Other

## 2015-07-13 DIAGNOSIS — R062 Wheezing: Secondary | ICD-10-CM

## 2015-07-13 DIAGNOSIS — R0789 Other chest pain: Secondary | ICD-10-CM

## 2015-07-13 LAB — BASIC METABOLIC PANEL
ANION GAP: 8 (ref 5–15)
BUN: 15 mg/dL (ref 6–20)
CALCIUM: 9.8 mg/dL (ref 8.9–10.3)
CO2: 25 mmol/L (ref 22–32)
CREATININE: 0.88 mg/dL (ref 0.44–1.00)
Chloride: 107 mmol/L (ref 101–111)
GFR calc Af Amer: >60 mL/min (ref >60)
GFR calc non Af Amer: >60 mL/min (ref >60)
Glucose, Bld: 108 mg/dL — ABNORMAL HIGH (ref 65–99)
Potassium: 4.3 mmol/L (ref 3.5–5.1)
SODIUM: 140 mmol/L (ref 135–145)

## 2015-07-13 LAB — CBC WITH DIFFERENTIAL/PLATELET
Basophils Absolute: 0.1 10*3/uL (ref 0.0–0.1)
Basophils Relative: 1 % (ref 0–1)
EOS ABS: 0.3 10*3/uL (ref 0.0–0.7)
EOS PCT: 2 % (ref 0–5)
HCT: 46.7 % — ABNORMAL HIGH (ref 36.0–46.0)
Hemoglobin: 16.1 g/dL — ABNORMAL HIGH (ref 12.0–15.0)
LYMPHS ABS: 3.4 10*3/uL (ref 0.7–4.0)
Lymphocytes Relative: 31 % (ref 12–46)
MCH: 31.7 pg (ref 26.0–34.0)
MCHC: 34.5 g/dL (ref 30.0–36.0)
MCV: 91.9 fL (ref 78.0–100.0)
Monocytes Absolute: 0.6 10*3/uL (ref 0.1–1.0)
Monocytes Relative: 5 % (ref 3–12)
Neutro Abs: 6.8 10*3/uL (ref 1.7–7.7)
Neutrophils Relative %: 61 % (ref 43–77)
PLATELETS: 204 10*3/uL (ref 150–400)
RBC: 5.08 MIL/uL (ref 3.87–5.11)
RDW: 13 % (ref 11.5–15.5)
WBC: 11.1 10*3/uL — ABNORMAL HIGH (ref 4.0–10.5)

## 2015-07-13 LAB — TROPONIN I
Troponin I: <0.03 ng/mL (ref <0.031)
Troponin I: <0.03 ng/mL (ref <0.031)

## 2015-07-13 MED ORDER — PREDNISONE 10 MG PO TABS: 50.0000 mg | ORAL_TABLET | Freq: Every day | ORAL | Status: DC

## 2015-07-13 MED ORDER — ALBUTEROL SULFATE HFA 108 (90 BASE) MCG/ACT IN AERS
1.0000 | INHALATION_SPRAY | Freq: Once | RESPIRATORY_TRACT | Status: AC
  Administered 2015-07-13: 2 via RESPIRATORY_TRACT
  Filled 2015-07-13: qty 6.7

## 2015-07-13 MED ORDER — ASPIRIN 81 MG PO CHEW
162.0000 mg | CHEWABLE_TABLET | Freq: Once | ORAL | Status: AC
  Administered 2015-07-13: 162 mg via ORAL
  Filled 2015-07-13: qty 2

## 2015-07-13 MED ORDER — IPRATROPIUM-ALBUTEROL 0.5-2.5 (3) MG/3ML IN SOLN
RESPIRATORY_TRACT | Status: AC
  Filled 2015-07-13: qty 3

## 2015-07-13 MED ORDER — PREDNISONE 20 MG PO TABS
60.0000 mg | ORAL_TABLET | Freq: Once | ORAL | Status: AC
  Administered 2015-07-13: 60 mg via ORAL
  Filled 2015-07-13: qty 3

## 2015-07-13 MED ORDER — IPRATROPIUM-ALBUTEROL 0.5-2.5 (3) MG/3ML IN SOLN
5.0000 mL | Freq: Once | RESPIRATORY_TRACT | Status: AC
  Administered 2015-07-13: 5 mL via RESPIRATORY_TRACT
  Filled 2015-07-13: qty 3

## 2015-07-13 NOTE — ED Notes (Signed)
Dr. Nanavati at bedside 

## 2015-07-13 NOTE — Discharge Instructions (Signed)
We saw you in the ER for the chest pain/shortness of breath. All of our cardiac workup is normal, including labs, EKG and chest X-RAY are normal. We are not sure what is causing your discomfort, but we feel comfortable sending you home at this time.  The workup in the ER is not complete, and you should follow up with your primary care doctor for further evaluation. We also recommend that you see Cardiology team if the pain continued. Take baby aspirin daily.  Please return to the ER if you have worsening chest pain, shortness of breath, pain radiating to your jaw, shoulder, or back, sweats or fainting. Otherwise see the Cardiologist or your primary care doctor as requested.  Finally, you had wheezing, so take prednisone and use inhaler as needed. Your doctor should help diagnosing COPD.   Chest Pain (Nonspecific) It is often hard to give a specific diagnosis for the cause of chest pain. There is always a chance that your pain could be related to something serious, such as a heart attack or a blood clot in the lungs. You need to follow up with your health care provider for further evaluation. CAUSES   Heartburn.  Pneumonia or bronchitis.  Anxiety or stress.  Inflammation around your heart (pericarditis) or lung (pleuritis or pleurisy).  A blood clot in the lung.  A collapsed lung (pneumothorax). It can develop suddenly on its own (spontaneous pneumothorax) or from trauma to the chest.  Shingles infection (herpes zoster virus). The chest wall is composed of bones, muscles, and cartilage. Any of these can be the source of the pain.  The bones can be bruised by injury.  The muscles or cartilage can be strained by coughing or overwork.  The cartilage can be affected by inflammation and become sore (costochondritis). DIAGNOSIS  Lab tests or other studies may be needed to find the cause of your pain. Your health care provider may have you take a test called an ambulatory  electrocardiogram (ECG). An ECG records your heartbeat patterns over a 24-hour period. You may also have other tests, such as:  Transthoracic echocardiogram (TTE). During echocardiography, sound waves are used to evaluate how blood flows through your heart.  Transesophageal echocardiogram (TEE).  Cardiac monitoring. This allows your health care provider to monitor your heart rate and rhythm in real time.  Holter monitor. This is a portable device that records your heartbeat and can help diagnose heart arrhythmias. It allows your health care provider to track your heart activity for several days, if needed.  Stress tests by exercise or by giving medicine that makes the heart beat faster. TREATMENT   Treatment depends on what may be causing your chest pain. Treatment may include:  Acid blockers for heartburn.  Anti-inflammatory medicine.  Pain medicine for inflammatory conditions.  Antibiotics if an infection is present.  You may be advised to change lifestyle habits. This includes stopping smoking and avoiding alcohol, caffeine, and chocolate.  You may be advised to keep your head raised (elevated) when sleeping. This reduces the chance of acid going backward from your stomach into your esophagus. Most of the time, nonspecific chest pain will improve within 2-3 days with rest and mild pain medicine.  HOME CARE INSTRUCTIONS   If antibiotics were prescribed, take them as directed. Finish them even if you start to feel better.  For the next few days, avoid physical activities that bring on chest pain. Continue physical activities as directed.  Do not use any tobacco products, including cigarettes,  chewing tobacco, or electronic cigarettes.  Avoid drinking alcohol.  Only take medicine as directed by your health care provider.  Follow your health care provider's suggestions for further testing if your chest pain does not go away.  Keep any follow-up appointments you made. If you do  not go to an appointment, you could develop lasting (chronic) problems with pain. If there is any problem keeping an appointment, call to reschedule. SEEK MEDICAL CARE IF:  1. Your chest pain does not go away, even after treatment. 2. You have a rash with blisters on your chest. 3. You have a fever. SEEK IMMEDIATE MEDICAL CARE IF:   You have increased chest pain or pain that spreads to your arm, neck, jaw, back, or abdomen.  You have shortness of breath.  You have an increasing cough, or you cough up blood.  You have severe back or abdominal pain.  You feel nauseous or vomit.  You have severe weakness.  You faint.  You have chills. This is an emergency. Do not wait to see if the pain will go away. Get medical help at once. Call your local emergency services (911 in [place] Do not drive yourself to the hospital. MAKE SURE YOU:   Understand these instructions.  Will watch your condition.  Will get help right away if you are not doing well or get worse. Document Released: 07/28/2005 Document Revised: 10/23/2013 Document Reviewed: 05/23/2008 [facility] Patient Information 2015 [place], [place]. This information is not intended to replace advice given to you by your health care provider. Make sure you discuss any questions you have with your health care provider.  How to Use an Inhaler Using your inhaler correctly is very important. Good technique will make sure that the medicine reaches your lungs.  HOW TO USE AN INHALER:  Take the cap off the inhaler.  If this is the first time using your inhaler, you need to prime it. Shake the inhaler for 5 seconds. Release four puffs into the air, away from your face. Ask your doctor for help if you have questions.  Shake the inhaler for 5 seconds.  Turn the inhaler so the bottle is above the mouthpiece.  Put your pointer finger on top of the bottle. Your thumb holds the bottom of the inhaler.  Open your mouth.  Either hold the inhaler  away from your mouth (the width of 2 fingers) or place your lips tightly around the mouthpiece. Ask your doctor which way to use your inhaler.  Breathe out as much air as possible.  Breathe in and push down on the bottle 1 time to release the medicine. You will feel the medicine go in your mouth and throat.  Continue to take a deep breath in very slowly. Try to fill your lungs.  After you have breathed in completely, hold your breath for 10 seconds. This will help the medicine to settle in your lungs. If you cannot hold your breath for 10 seconds, hold it for as long as you can before you breathe out.  Breathe out slowly, through pursed lips. Whistling is an example of pursed lips.  If your doctor has told you to take more than 1 puff, wait at least 15-30 seconds between puffs. This will help you get the best results from your medicine. Do not use the inhaler more than your doctor tells you to.  Put the cap back on the inhaler.  Follow the directions from your doctor or from the inhaler package about cleaning the  inhaler. If you use more than one inhaler, ask your doctor which inhalers to use and what order to use them in. Ask your doctor to help you figure out when you will need to refill your inhaler.  If you use a steroid inhaler, always rinse your mouth with water after your last puff, gargle and spit out the water. Do not swallow the water. GET HELP IF:  The inhaler medicine only partially helps to stop wheezing or shortness of breath.  You are having trouble using your inhaler.  You have some increase in thick spit (phlegm). GET HELP RIGHT AWAY IF:  The inhaler medicine does not help your wheezing or shortness of breath or you have tightness in your chest.  You have dizziness, headaches, or fast heart rate.  You have chills, fever, or night sweats.  You have a large increase of thick spit, or your thick spit is bloody. MAKE SURE YOU:   Understand these instructions.  Will  watch your condition.  Will get help right away if you are not doing well or get worse. Document Released: 07/27/2008 Document Revised: 08/08/2013 Document Reviewed: 05/17/2013 [facility] Patient Information 2015 [place], [place]. This information is not intended to replace advice given to you by your health care provider. Make sure you discuss any questions you have with your health care provider. Chronic Obstructive Pulmonary Disease Chronic obstructive pulmonary disease (COPD) is a common lung condition in which airflow from the lungs is limited. COPD is a general term that can be used to describe many different lung problems that limit airflow, including both chronic bronchitis and emphysema. If you have COPD, your lung function will probably never return to normal, but there are measures you can take to improve lung function and make yourself feel better.  CAUSES   Smoking (common).   Exposure to secondhand smoke.   Genetic problems.  Chronic inflammatory lung diseases or recurrent infections. SYMPTOMS   Shortness of breath, especially with physical activity.   Deep, persistent (chronic) cough with a large amount of thick mucus.   Wheezing.   Rapid breaths (tachypnea).   Gray or bluish discoloration (cyanosis) of the skin, especially in fingers, toes, or lips.   Fatigue.   Weight loss.   Frequent infections or episodes when breathing symptoms become much worse (exacerbations).   Chest tightness. DIAGNOSIS  Your health care provider will take a medical history and perform a physical examination to make the initial diagnosis. Additional tests for COPD may include:   Lung (pulmonary) function tests.  Chest X-ray.  CT scan.  Blood tests. TREATMENT  Treatment available to help you feel better when you have COPD includes:   Inhaler and nebulizer medicines. These help manage the symptoms of COPD and make your breathing more comfortable.  Supplemental oxygen.  Supplemental oxygen is only helpful if you have a low oxygen level in your blood.   Exercise and physical activity. These are beneficial for nearly all people with COPD. Some people may also benefit from a pulmonary rehabilitation program. HOME CARE INSTRUCTIONS   Take all medicines (inhaled or pills) as directed by your health care provider.  Avoid over-the-counter medicines or cough syrups that dry up your airway (such as antihistamines) and slow down the elimination of secretions unless instructed otherwise by your health care provider.   If you are a smoker, the most important thing that you can do is stop smoking. Continuing to smoke will cause further lung damage and breathing trouble. Ask your health care  provider for help with quitting smoking. He or she can direct you to community resources or hospitals that provide support.  Avoid exposure to irritants such as smoke, chemicals, and fumes that aggravate your breathing.  Use oxygen therapy and pulmonary rehabilitation if directed by your health care provider. If you require home oxygen therapy, ask your health care provider whether you should purchase a pulse oximeter to measure your oxygen level at home.   Avoid contact with individuals who have a contagious illness.  Avoid extreme temperature and humidity changes.  Eat healthy foods. Eating smaller, more frequent meals and resting before meals may help you maintain your strength.  Stay active, but balance activity with periods of rest. Exercise and physical activity will help you maintain your ability to do things you want to do.  Preventing infection and hospitalization is very important when you have COPD. Make sure to receive all the vaccines your health care provider recommends, especially the pneumococcal and influenza vaccines. Ask your health care provider whether you need a pneumonia vaccine.  Learn and use relaxation techniques to manage stress.  Learn and use  controlled breathing techniques as directed by your health care provider. Controlled breathing techniques include:   Pursed lip breathing. Start by breathing in (inhaling) through your nose for 1 second. Then, purse your lips as if you were going to whistle and breathe out (exhale) through the pursed lips for 2 seconds.   Diaphragmatic breathing. Start by putting one hand on your abdomen just above your waist. Inhale slowly through your nose. The hand on your abdomen should move out. Then purse your lips and exhale slowly. You should be able to feel the hand on your abdomen moving in as you exhale.   Learn and use controlled coughing to clear mucus from your lungs. Controlled coughing is a series of short, progressive coughs. The steps of controlled coughing are:  4. Lean your head slightly forward.  5. Breathe in deeply using diaphragmatic breathing.  6. Try to hold your breath for 3 seconds.  7. Keep your mouth slightly open while coughing twice.  8. Spit any mucus out into a tissue.  9. Rest and repeat the steps once or twice as needed. SEEK MEDICAL CARE IF:   You are coughing up more mucus than usual.   There is a change in the color or thickness of your mucus.   Your breathing is more labored than usual.   Your breathing is faster than usual.  SEEK IMMEDIATE MEDICAL CARE IF:   You have shortness of breath while you are resting.   You have shortness of breath that prevents you from:  Being able to talk.   Performing your usual physical activities.   You have chest pain lasting longer than 5 minutes.   Your skin color is more cyanotic than usual.  You measure low oxygen saturations for longer than 5 minutes with a pulse oximeter. MAKE SURE YOU:   Understand these instructions.  Will watch your condition.  Will get help right away if you are not doing well or get worse. Document Released: 07/28/2005 Document Revised: 03/04/2014 Document Reviewed:  06/14/2013 [facility] Patient Information 2015 [place], [place]. This information is not intended to replace advice given to you by your health care provider. Make sure you discuss any questions you have with your health care provider.

## 2015-07-13 NOTE — ED Provider Notes (Signed)
CSN: [id number]   Arrival date & time 07/12/15 2331  History  This chart was scribed for Derwood Kaplan, MD by Bethel Born, ED Scribe. This patient was seen in room B19C/B19C and the patient's care was started at 2:54 AM.  Chief Complaint  Patient presents with  . Chest Pain    HPI The history is provided by the patient. No language interpreter was used.   Tina Fitzgerald is a 44 y.o. female with PMHx of TV endocarditis s/p debridement in 2007 and COPD who presents to the Emergency Department complaining of intermittent left arm pain with onset 3 days ago. She rates the pain 5/10 in severity. The pain is typically elicited with walking and worse with movement.  Associated symptoms include 3 days of left sided chest tightness, dizziness, sweating, fatigue, and sleeping more. Pt denies nausea and vomiting. No family history of early MI. 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 Smokes 1 PPD, started using tobacco at 12. No history of DVT/PE. No recent travel or surgery. No hormonal therapy. LNMP was 2-3 weeks ago.  Past Medical History  Diagnosis Date  . Endocarditis     a. 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  Marland Kitchen PFO (patent foramen ovale)     a. s/p closure in 2007.  Marland Kitchen Hepatitis B carrier   . Tobacco abuse   . Asthma   . Hepatitis C   . Cardiac tamponade     a. s/p subxiphoid window 2007 in setting of TV endocarditis. b. She had suffered a mild anoxic injury during her recent hospitalization felt to be related to hypoperfusion [name] 06/15/2006 (05/09/2013)  . Cardiac arrest     a. 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  . Memory loss, short term     "since OHS in 2007" (05/09/2013)   . GERD (gastroesophageal reflux disease)   . Arthritis     "knees" (05/09/2013)  . Anxiety   . Depression   . ARF (acute renal failure)     a. 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  . Septic pulmonary embolism     a. During admission for TV endocarditis.  . Severe tricuspid regurgitation     a. By TEE/cath 04/2013 - for outpt referral to [facility].   Marland Kitchen History of tricuspid valve disorder 04/16/2013  . Chronic headaches   . Hx: UTI (urinary tract infection)     Past Surgical History  Procedure Laterality Date  . Tricuspid valve surgery  04/22/2006    debridement of septal leaflet for MSSA bacterial endocarditis with closure PFO  . Patent foramen ovale closure  04/22/2006    closed during tricuspid valve surgery  . Cardiac catheterization      multiple/notes 06/15/2006 (05/09/2013)  . Subxyphoid pericardial window  05/07/2006    for large postoperative pericardial effusion   . Dilation and curettage of uterus      "couple of those years ago; one a couple years ago after I lost a pregnancy" (05/09/2013)  . Tee without cardioversion N/A 05/08/2013    Procedure: TRANSESOPHAGEAL ECHOCARDIOGRAM (TEE);  Surgeon: Lewayne Bunting, MD;  Location: [facility] ENDOSCOPY;  Service: Cardiovascular;  Laterality: N/A;  . Left and right heart catheterization with coronary angiogram N/A 05/08/2013  Procedure: LEFT AND RIGHT HEART CATHETERIZATION WITH CORONARY ANGIOGRAM;  Surgeon: Kathleene Hazel, MD;  Location: [facility] CATH LAB;  Service: Cardiovascular;  Laterality: N/A;    Family History  Problem Relation Age of Onset  . Hyperlipidemia Mother   . Hyperlipidemia Brother   . Heart disease Maternal Grandmother   . Alcohol abuse Father   . Arthritis Father   . Drug abuse Father   . Diabetes Daughter   . Ovarian cancer Cousin   . Mental illness Other   . Cancer Neg Hx   . Early death Neg Hx   . Hypertension Neg Hx   . Kidney disease Neg Hx   . Learning disabilities Neg Hx   .  Stroke Neg Hx     Social History  Substance Use Topics  . Smoking status: Current Every Day Smoker -- 0.75 packs/day for 30 years    Types: Cigarettes  . Smokeless tobacco: Never Used  . Alcohol Use: 4.2 oz/week    7 Cans of beer per week     Review of Systems  Constitutional: Positive for diaphoresis and fatigue.  Respiratory: Positive for chest tightness.   Gastrointestinal: Negative for nausea and vomiting.  Musculoskeletal:       Left arm pain  Neurological: Positive for dizziness.   Home Medications   Prior to Admission medications   Medication Sig Start Date End Date Taking? Authorizing Provider  Ascorbic Acid (VITAMIN C WITH ROSE HIPS) 500 MG tablet Take 1 tablet (500 mg total) by mouth daily. Patient taking differently: Take 1,000 mg by mouth daily.  02/13/14   Newt Lukes, MD  diazepam (VALIUM) 10 MG tablet Take 10 mg by mouth 2 (two) times daily as needed for anxiety.    Historical Provider, MD  famotidine (PEPCID) 20 MG tablet Take 20 mg by mouth daily.    Historical Provider, MD  fluconazole (DIFLUCAN) 150 MG tablet Take 1 tablet (150 mg total) by mouth once. 12/21/14   Purvis Sheffield, MD  ibuprofen (ADVIL,MOTRIN) 200 MG tablet Take 800 mg by mouth every 6 (six) hours as needed for moderate pain.    Historical Provider, MD  Multiple Vitamins-Calcium (ONE-A-DAY WOMENS FORMULA PO) Take 1 tablet by mouth daily.    Historical Provider, MD  Omega-3 Fatty Acids (FISH OIL) 1000 MG CAPS Take 3,000 mg by mouth daily.     Historical Provider, MD  potassium gluconate 595 MG TABS tablet Take 1 tablet (595 mg total) by mouth every other day. 02/13/14   Newt Lukes, MD  predniSONE (DELTASONE) 10 MG tablet Take 5 tablets (50 mg total) by mouth daily. 07/13/15   Derwood Kaplan, MD  sertraline (ZOLOFT) 100 MG tablet Take 1 tablet (100 mg total) by mouth daily. 01/16/14   Ascencion Dike, PA-C  Tiotropium Bromide Monohydrate (SPIRIVA RESPIMAT) 2.5 MCG/ACT AERS Inhale 2 puffs  into the lungs daily. 06/22/14   Etta Grandchild, MD  zinc gluconate 50 MG tablet Take 50 mg by mouth every 7 (seven) days. On Sundays    Historical Provider, MD    Allergies  Amoxicillin; Penicillins; and Latex  Triage Vitals: BP 105/64 mmHg  Pulse 76  Temp(Src) 98.2 F (36.8 C) (Oral)  Resp 20  Ht 5' 5.5" (1.664 m)  Wt 176 lb (79.833 kg)  BMI 28.83 kg/m2  SpO2 97%  LMP 06/11/2015  Physical Exam  Constitutional: She is oriented to person, place, and time. She appears well-developed and well-nourished.  HENT:  Head: Normocephalic.  Eyes:  EOM are normal.  Neck: Normal range of motion.  Cardiovascular: Normal rate and regular rhythm.   Pulmonary/Chest: Effort normal. She has wheezes.  Diffuse wheezing  Abdominal: She exhibits no distension.  Musculoskeletal: Normal range of motion.  Neurological: She is alert and oriented to person, place, and time.  Psychiatric: She has a normal mood and affect.  Nursing note and vitals reviewed.   ED Course  Procedures   DIAGNOSTIC STUDIES: Oxygen Saturation is 97% on RA, normal by my interpretation.    COORDINATION OF CARE: 3:19 AM Discussed treatment plan which includes CXR, EKG, and lab work with pt at bedside and pt agreed to plan.  Labs Reviewed  BASIC METABOLIC PANEL - Abnormal; Notable for the following:    Glucose, Bld 108 (*)    All other components within normal limits  CBC WITH DIFFERENTIAL/PLATELET - Abnormal; Notable for the following:    WBC 11.1 (*)    Hemoglobin 16.1 (*)    HCT 46.7 (*)    All other components within normal limits  TROPONIN I  TROPONIN I    Imaging Review Dg Chest 2 View  07/13/2015   CLINICAL DATA:  44 year old female with chest pain and wheezing  EXAM: CHEST  2 VIEW  COMPARISON:  Radiograph 12/21/2014  FINDINGS: Emphysematous changes of the lungs. No focal consolidation, pleural effusion, or pneumothorax. Stable cardiac silhouette. Median sternotomy wires. The osseous structures are grossly  unremarkable.  IMPRESSION: No active cardiopulmonary disease.   Electronically Signed   By: Elgie Collard M.D.   On: 07/13/2015 02:13    EKG Interpretation  Date/Time:  Saturday July 12 2015 23:37:53 EDT Ventricular Rate:  82 PR Interval:  136 QRS Duration: 86 QT Interval:  368 QTC Calculation: 429 R Axis:   67 Text Interpretation:  Normal sinus rhythm Normal ECG ED PHYSICIAN INTERPRETATION AVAILABLE IN [facility] Confirmed by TEST, Record ([id number]) on 07/13/2015 4:16:52 PM    MDM   Final diagnoses:  Atypical chest pain  Wheezing     I personally performed the services described in this documentation, which was scribed in my presence. The recorded information has been reviewed and is accurate.  The patient was counseled on the dangers of tobacco use, and was advised to quit and referred to a tobacco cessation program.  Reviewed strategies to maximize success, including removing cigarettes and smoking materials from environment, stress management and support of family/friends. Discussion 2-3 min.  Pt comes in with cc of dib and atypical chest discomfort. Also has L arm pain HEAR score is 2 - 1 for risk factor and 1 for hx. Pain is not exertional and not typical. EKG shows no acute changes.  Also, she is wheezing. Will give her inhaler.  Possibly new COPD. D/c with cards f/u.    Derwood Kaplan, MD 07/14/15 [phone]

## 2015-07-21 ENCOUNTER — Ambulatory Visit: Payer: Self-pay | Admitting: Cardiology

## 2015-08-08 ENCOUNTER — Ambulatory Visit: Payer: Self-pay

## 2015-09-06 ENCOUNTER — Encounter (INDEPENDENT_AMBULATORY_CARE_PROVIDER_SITE_OTHER): Payer: Self-pay | Admitting: Internal Medicine

## 2015-09-12 ENCOUNTER — Ambulatory Visit
Admission: RE | Admit: 2015-09-12 | Discharge: 2015-09-12 | Disposition: A | Discharge status: Home / Self Care | Payer: Medicare Other | Source: Ambulatory Visit | Attending: Internal Medicine | Admitting: Internal Medicine

## 2015-09-12 DIAGNOSIS — Z1231 Encounter for screening mammogram for malignant neoplasm of breast: Secondary | ICD-10-CM

## 2015-10-13 ENCOUNTER — Encounter: Payer: Self-pay | Admitting: *Deleted

## 2015-10-16 ENCOUNTER — Ambulatory Visit (INDEPENDENT_AMBULATORY_CARE_PROVIDER_SITE_OTHER): Payer: Medicare Other | Admitting: Cardiovascular Disease

## 2015-10-16 ENCOUNTER — Encounter: Payer: Self-pay | Admitting: Cardiovascular Disease

## 2015-10-16 VITALS — BP 104/68 | HR 65 | Ht 65.5 in | Wt 182.0 lb

## 2015-10-16 DIAGNOSIS — J449 Chronic obstructive pulmonary disease, unspecified: Secondary | ICD-10-CM | POA: Diagnosis not present

## 2015-10-16 DIAGNOSIS — I071 Rheumatic tricuspid insufficiency: Secondary | ICD-10-CM

## 2015-10-16 DIAGNOSIS — Z720 Tobacco use: Secondary | ICD-10-CM | POA: Diagnosis not present

## 2015-10-16 NOTE — Progress Notes (Signed)
Chief Complaint  Patient presents with  . Follow-up    dyspnea     History of Present Illness: 44 yo female with history of TV endocarditis, former polysubstance abuse, Hepatitis B and C, PFO s/p closure here today for cardiac follow up. I saw her 04/16/13 to re-establish cardiology care. She had been followed in the past in our [place] office by Dr. Freida Busman. 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 At the visit in our office 04/16/13, she c/o dizziness, SOB, fatigue, flu-like symptoms.  I arranged an echo 04/23/13 which showed LVEF=45%, dilated RV and RA, severe TR. Cardiac cath 05/08/13 with no evidence of CAD. TEE confirmed severe TR. PFTS with severe COPD. She was seen by Dr. Cornelius Moras and continued conservative therapy was recommended. Repeat echo 08/22/14 with moderate TR. Marland Kitchen   She is here today for follow up. She feels well. No chest pain. No change in breathing. No LE edema. She is still smoking 3/4 ppd  Primary Care Physician: Dr. Anner Crete [facility])  Past Medical History  Diagnosis Date  . Endocarditis     a. 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  Marland Kitchen PFO (patent foramen ovale)     a. s/p closure in 2007.  Marland Kitchen Hepatitis B carrier   . Tobacco abuse   . Asthma   . Hepatitis C   . Cardiac tamponade     a. s/p subxiphoid window 2007 in setting of TV endocarditis. b. She had suffered a mild anoxic injury during her recent hospitalization felt to be related to hypoperfusion [name] 06/15/2006 (05/09/2013)  . Cardiac arrest (HCC)     a. Asystolic arrest 2007 in setting of TV  endocarditis, pericardial tamponade.  . Memory loss, short term     "since OHS in 2007" (05/09/2013)  . GERD (gastroesophageal reflux disease)   . Arthritis     "knees" (05/09/2013)  . Anxiety   . Depression   . ARF (acute renal failure) (HCC)     a. 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  . Septic pulmonary embolism (HCC)     a. During admission for TV endocarditis.  . Severe tricuspid regurgitation     a. By TEE/cath 04/2013 - for outpt referral to [facility].   Marland Kitchen History of tricuspid valve disorder 04/16/2013  . Chronic headaches   . Hx: UTI (urinary tract infection)     Past Surgical History  Procedure Laterality Date  . Tricuspid valve surgery  04/22/2006    debridement of septal leaflet for MSSA bacterial endocarditis with closure PFO  . Patent foramen ovale closure  04/22/2006    closed during tricuspid valve surgery  . Cardiac catheterization      multiple/notes 06/15/2006 (05/09/2013)  . Subxyphoid pericardial window  05/07/2006    for large postoperative pericardial effusion   . Dilation and curettage of uterus      "couple of those years ago; one a couple years ago after I lost a pregnancy" (05/09/2013)  . Tee without cardioversion N/A 05/08/2013    Procedure: TRANSESOPHAGEAL ECHOCARDIOGRAM (TEE);  Surgeon: Lewayne Bunting, MD;  Location: [facility] ENDOSCOPY;  Service:  Cardiovascular;  Laterality: N/A;  . Left and right heart catheterization with coronary angiogram N/A 05/08/2013    Procedure: LEFT AND RIGHT HEART CATHETERIZATION WITH CORONARY ANGIOGRAM;  Surgeon: Kathleene Hazel, MD;  Location: [facility] CATH LAB;  Service: Cardiovascular;  Laterality: N/A;    Current Outpatient Prescriptions  Medication Sig Dispense Refill  . Ascorbic Acid (VITAMIN C WITH ROSE HIPS) 500 MG tablet Take 1 tablet (500 mg total) by mouth daily. (Patient taking differently: Take 1,000 mg by mouth daily. ) 90 tablet 3  . diazepam (VALIUM) 10 MG tablet Take 10 mg by mouth 2 (two)  times daily as needed for anxiety.    . famotidine (PEPCID) 20 MG tablet Take 20 mg by mouth daily.    Marland Kitchen ibuprofen (ADVIL,MOTRIN) 800 MG tablet Take 1 tablet by mouth every 8 (eight) hours as needed.    . Multiple Vitamins-Calcium (ONE-A-DAY WOMENS FORMULA PO) Take 1 tablet by mouth daily.    . Omega-3 Fatty Acids (FISH OIL) 1000 MG CAPS Take 3,000 mg by mouth daily.     . potassium gluconate 595 MG TABS tablet Take 1 tablet (595 mg total) by mouth every other day. 90 tablet 3  . PROAIR HFA 108 (90 BASE) MCG/ACT inhaler Inhale 1 puff into the lungs as needed.    . sertraline (ZOLOFT) 100 MG tablet Take 1 tablet (100 mg total) by mouth daily. 30 tablet 5  . tamsulosin (FLOMAX) 0.4 MG CAPS capsule Take 1 capsule by mouth daily.    . Tiotropium Bromide Monohydrate (SPIRIVA RESPIMAT) 2.5 MCG/ACT AERS Inhale 2 puffs into the lungs daily. 3 Inhaler 4  . zinc gluconate 50 MG tablet Take 50 mg by mouth every 7 (seven) days. On Sundays     No current facility-administered medications for this visit.    Allergies  Allergen Reactions  . Amoxicillin Diarrhea and Other (See Comments)    " I get very sick with this medication"  . Latex Rash    " I'm allergic to latex, and it breaks by skin out in an itchy rash"  . Penicillins Rash    Social History   Social History  . Marital Status: Legally Separated    Spouse Name: N/A  . Number of Children: 6  . Years of Education: N/A   Occupational History  . disabled    Social History Main Topics  . Smoking status: Current Every Day Smoker -- 0.75 packs/day for 30 years    Types: Cigarettes  . Smokeless tobacco: Never Used  . Alcohol Use: 4.2 oz/week    7 Cans of beer per week  . Drug Use: Yes    Special: Marijuana, Heroin, "Crack" cocaine, Cocaine     Comment: 05/09/2013 "<1 joint/day; last heroin & crack was before heart OR in 2007"  . Sexual Activity: Yes    Birth Control/ Protection: Condom, IUD   Other Topics Concern  . Not on file    Social History Narrative    Family History  Problem Relation Age of Onset  . Hyperlipidemia Mother   . Hyperlipidemia Brother   . Heart disease Maternal Grandmother   . Alcohol abuse Father   . Arthritis Father   . Drug abuse Father   . Diabetes Daughter   . Ovarian cancer Cousin   . Mental illness Other   . Cancer Neg Hx   . Early death Neg Hx   . Hypertension Neg Hx   . Kidney disease Neg Hx   . Learning disabilities  Neg Hx   . Stroke Neg Hx     Review of Systems:  As stated in the HPI and otherwise negative.   BP 104/68 mmHg  Pulse 65  Ht 5' 5.5" (1.664 m)  Wt 182 lb (82.555 kg)  BMI 29.82 kg/m2  SpO2 97%  Physical Examination: General: Well developed, well nourished, NAD HEENT: OP clear, mucus membranes moist SKIN: warm, dry. No rashes. Neuro: No focal deficits Musculoskeletal: Muscle strength 5/5 all ext Psychiatric: Mood and affect normal Neck: No JVD, no carotid bruits, no thyromegaly, no lymphadenopathy. Lungs:Clear bilaterally, no wheezes, rhonci, crackles Cardiovascular: Regular rate and rhythm with systolic murmur. No gallops or rubs. Abdomen:Soft. Bowel sounds present. Non-tender.  Extremities: No lower extremity edema. Pulses are 2 + in the bilateral DP/PT.  Echo 08/22/14: Left ventricle: Difficult acoustic windows limit study. OVerall LVEF appears moderately depressed. IN comparison to previous echo from 08/01/13, LVEF function appears to be some down. Again, limited by windows. The cavity size was normal. Wall thickness was normal. - Right ventricle: The cavity size was mildly dilated. Systolic function was mildly reduced. - Right atrium: The atrium was severely dilated. - Tricuspid valve: There was moderate regurgitation.  Cardiac cath 05/08/13:  Hemodynamic Findings:  Ao: 152/87  LV: 153/5/12  RA: 12  RV: 29/8/12  PA: 26/5 (mean 16)  PCWP: 8  Fick Cardiac Output: 3.86 L/min  Fick Cardiac Index: 2.08 L/min/m2  Central Aortic  Saturation: 96%  Pulmonary Artery Saturation: 66%  Angiographic Findings:  Left main: No obstructive disease.  Left Anterior Descending Artery:Large caliber vessel that courses to the apex. Several small caliber diagonal branches. No obstructive disease.  Circumflex Artery: Moderate caliber vessel with early obtuse marginal branch. No obstructive disease.  Right Coronary Artery: Large dominant vessel with no obstructive disease.  Left Ventricular Angiogram: LVEF=45% with global hypokinesis.  Impression:  1. No angiographic evidence of CAD  2. Mild global LV systolic dysfunction  3. Known severe tricuspid valve regurgitation  Recommendations: Will review findings of TEE this am and plan referral to see Dr. Cornelius Moras in [place] surgery office for evaluation of her tricuspid valve. Dr. Cornelius Moras performed her tricuspid valve procedure in 2007.   TEE 05/08/13:  Left ventricle: Systolic function was normal. The estimated ejection fraction was in the range of 50% to 55%. Hypokinesis of the distalanteroseptal myocardium. - Aortic valve: No evidence of vegetation. - Mitral valve: No evidence of vegetation. - Right ventricle: The cavity size was moderately dilated. Systolic function was mildly reduced. - Right atrium: The atrium was moderately dilated. - Atrial septum: No defect or patent foramen ovale was identified. There was an atrial septal aneurysm. - Tricuspid valve: Mobility of the septal leaflet was restricted. No evidence of vegetation. Wide-open regurgitation. - Pulmonic valve: No evidence of vegetation.  EKG:  EKG is not ordered today. The ekg ordered today demonstrates   Recent Labs: 07/12/2015: BUN 15; Creatinine, Ser 0.88; Hemoglobin 16.1*; Platelets 204; Potassium 4.3; Sodium 140   Lipid Panel    Component Value Date/Time   CHOL 164 01/16/2014 1440   TRIG 90.0 01/16/2014 1440   HDL 47.30 01/16/2014 1440   CHOLHDL 3 01/16/2014 1440   VLDL 18.0 01/16/2014 1440   LDLCALC 99 01/16/2014  1440     Wt Readings from Last 3 Encounters:  10/16/15 182 lb (82.555 kg)  07/12/15 176 lb (79.833 kg)  08/22/14 170 lb 1.9 oz (77.166 kg)     Other studies Reviewed: Additional studies/ records that were reviewed today  include:  Review of the above records demonstrates:    Assessment and Plan:   1. Tricuspid Valve regurgitation: Moderate by most recent echo October 2015. She has had a TEE in 2014. Cardiac cath with no CAD. She has been evaluated by Dr. Cornelius Moraswen with CT surgery in 2014. Since she has no signs of right sided heart failure, no indication for tricuspid valve repair at this time. She is doing well. Will continue to follow. Will repeat echo spring 2017. She will use antibiotic prophylaxis before dental procedures.   2. COPD: Followed in primary care.    3. Tobacco abuse: Smoking cessation recommended. She wishes to stop. Counseling provided today.   Current medicines are reviewed at length with the patient today.  The patient does not have concerns regarding medicines.  The following changes have been made:  no change  Labs/ tests ordered today include:   Orders Placed This Encounter  Procedures  . Echocardiogram    Disposition:   FU with me in 12  months  Signed, Verne Carrowhristopher Alric Geise, MD 10/16/2015 12:53 PM    [facility] [address], [place], [place]  [address] Phone: [phone]; Fax: [phone]

## 2015-10-16 NOTE — Patient Instructions (Signed)
Medication Instructions:  The current medical regimen is effective;  continue present plan and medications.  Testing/Procedures: Your physician has requested that you have an echocardiogram in the spring. Echocardiography is a painless test that uses sound waves to create images of your heart. It provides your doctor with information about the size and shape of your heart and how well your heart's chambers and valves are working. This procedure takes approximately one hour. There are no restrictions for this procedure.  Follow-Up: Follow up in 1 year with Dr. Clifton JamesMcAlhany.  You will receive a letter in the mail 2 months before you are due.  Please call us when you receive this letter to schedule your follow up appointment.  If you need a refill on your cardiac medications before your next appointment, please call your pharmacy.  Thank you for choosing [facility]!!

## 2015-10-31 ENCOUNTER — Ambulatory Visit (HOSPITAL_COMMUNITY): Payer: Medicare Other | Attending: Cardiology

## 2015-11-11 ENCOUNTER — Other Ambulatory Visit: Payer: Self-pay

## 2015-11-11 ENCOUNTER — Ambulatory Visit (HOSPITAL_COMMUNITY): Payer: Medicare HMO | Attending: Cardiology

## 2015-11-11 DIAGNOSIS — F17200 Nicotine dependence, unspecified, uncomplicated: Secondary | ICD-10-CM | POA: Diagnosis not present

## 2015-11-11 DIAGNOSIS — I517 Cardiomegaly: Secondary | ICD-10-CM | POA: Diagnosis not present

## 2015-11-11 DIAGNOSIS — Z8249 Family history of ischemic heart disease and other diseases of the circulatory system: Secondary | ICD-10-CM | POA: Diagnosis not present

## 2015-11-11 DIAGNOSIS — I071 Rheumatic tricuspid insufficiency: Secondary | ICD-10-CM

## 2016-09-16 ENCOUNTER — Telehealth: Payer: Self-pay | Admitting: Cardiovascular Disease

## 2016-09-16 NOTE — Telephone Encounter (Signed)
Rec'd from [employer] forward 5 pages to Dr. Clifton JamesMcAlhany

## 2016-11-01 DIAGNOSIS — Z8709 Personal history of other diseases of the respiratory system: Secondary | ICD-10-CM

## 2016-11-01 DIAGNOSIS — J9691 Respiratory failure, unspecified with hypoxia: Secondary | ICD-10-CM

## 2016-11-01 DIAGNOSIS — F418 Other specified anxiety disorders: Secondary | ICD-10-CM

## 2016-11-01 DIAGNOSIS — J181 Lobar pneumonia, unspecified organism: Secondary | ICD-10-CM

## 2016-11-01 DIAGNOSIS — Z8679 Personal history of other diseases of the circulatory system: Secondary | ICD-10-CM

## 2016-11-02 DIAGNOSIS — J181 Lobar pneumonia, unspecified organism: Secondary | ICD-10-CM | POA: Diagnosis not present

## 2016-11-02 DIAGNOSIS — F418 Other specified anxiety disorders: Secondary | ICD-10-CM | POA: Diagnosis not present

## 2016-11-02 DIAGNOSIS — J9691 Respiratory failure, unspecified with hypoxia: Secondary | ICD-10-CM | POA: Diagnosis not present

## 2016-11-02 DIAGNOSIS — Z8709 Personal history of other diseases of the respiratory system: Secondary | ICD-10-CM | POA: Diagnosis not present

## 2016-11-03 DIAGNOSIS — F418 Other specified anxiety disorders: Secondary | ICD-10-CM

## 2016-11-03 DIAGNOSIS — J181 Lobar pneumonia, unspecified organism: Secondary | ICD-10-CM

## 2016-11-03 DIAGNOSIS — Z8709 Personal history of other diseases of the respiratory system: Secondary | ICD-10-CM

## 2016-11-03 DIAGNOSIS — J9691 Respiratory failure, unspecified with hypoxia: Secondary | ICD-10-CM

## 2016-11-03 DIAGNOSIS — A419 Sepsis, unspecified organism: Secondary | ICD-10-CM

## 2016-11-03 DIAGNOSIS — Z720 Tobacco use: Secondary | ICD-10-CM

## 2017-01-12 ENCOUNTER — Encounter (INDEPENDENT_AMBULATORY_CARE_PROVIDER_SITE_OTHER): Payer: Self-pay | Admitting: Internal Medicine

## 2017-01-13 ENCOUNTER — Other Ambulatory Visit: Payer: Self-pay | Admitting: Obstetrics & Gynecology

## 2017-01-13 LAB — HM HIV SCREENING LAB: HM HIV Screening: NEGATIVE

## 2017-01-13 LAB — RESULTS CONSOLE HPV: CHL HPV: NEGATIVE

## 2017-01-13 LAB — HM HEPATITIS C SCREENING LAB: HM Hepatitis Screen: NEGATIVE

## 2017-01-15 LAB — CYTOLOGY - PAP

## 2017-01-25 ENCOUNTER — Encounter (INDEPENDENT_AMBULATORY_CARE_PROVIDER_SITE_OTHER): Payer: Self-pay | Admitting: Internal Medicine

## 2017-02-02 ENCOUNTER — Ambulatory Visit: Payer: Self-pay | Admitting: Nurse Practitioner

## 2017-09-26 ENCOUNTER — Telehealth: Payer: Self-pay | Admitting: *Deleted

## 2017-09-26 NOTE — Telephone Encounter (Signed)
Received message from medical records that pt had contacted them requesting a letter from Dr. Clifton JamesMcAlhany for her to give to [employer].  Letter is to address need for food stamps and her disability.  Pt was last seen here in December 2016.  I called and explained to her that we could not write a letter as it had been so long since she was seen in the office.  I offered to schedule pt to see Dr. Clifton JamesMcAlhany but she is unable to make an appointment at this time.

## 2019-06-21 LAB — OB RESULTS CONSOLE GC/CHLAMYDIA: Chlamydia: NEGATIVE

## 2019-09-24 ENCOUNTER — Emergency Department (HOSPITAL_COMMUNITY)
Admission: EM | Admit: 2019-09-24 | Discharge: 2019-09-24 | Disposition: A | Discharge status: Home / Self Care | Payer: Medicare Other | Attending: Emergency Medicine | Admitting: Emergency Medicine

## 2019-09-24 ENCOUNTER — Other Ambulatory Visit: Payer: Self-pay

## 2019-09-24 ENCOUNTER — Emergency Department (HOSPITAL_COMMUNITY): Payer: Medicare Other

## 2019-09-24 ENCOUNTER — Encounter (HOSPITAL_COMMUNITY): Payer: Self-pay | Admitting: Emergency Medicine

## 2019-09-24 DIAGNOSIS — R062 Wheezing: Secondary | ICD-10-CM | POA: Diagnosis not present

## 2019-09-24 DIAGNOSIS — R531 Weakness: Secondary | ICD-10-CM | POA: Diagnosis not present

## 2019-09-24 DIAGNOSIS — Z91040 Latex allergy status: Secondary | ICD-10-CM | POA: Insufficient documentation

## 2019-09-24 DIAGNOSIS — R05 Cough: Secondary | ICD-10-CM | POA: Insufficient documentation

## 2019-09-24 DIAGNOSIS — Z20828 Contact with and (suspected) exposure to other viral communicable diseases: Secondary | ICD-10-CM | POA: Insufficient documentation

## 2019-09-24 DIAGNOSIS — F17210 Nicotine dependence, cigarettes, uncomplicated: Secondary | ICD-10-CM | POA: Insufficient documentation

## 2019-09-24 DIAGNOSIS — Z79899 Other long term (current) drug therapy: Secondary | ICD-10-CM | POA: Insufficient documentation

## 2019-09-24 DIAGNOSIS — J449 Chronic obstructive pulmonary disease, unspecified: Secondary | ICD-10-CM | POA: Diagnosis not present

## 2019-09-24 DIAGNOSIS — R059 Cough, unspecified: Secondary | ICD-10-CM

## 2019-09-24 DIAGNOSIS — R5383 Other fatigue: Secondary | ICD-10-CM | POA: Diagnosis present

## 2019-09-24 LAB — CBC
HCT: 46.8 % — ABNORMAL HIGH (ref 36.0–46.0)
Hemoglobin: 15.5 g/dL — ABNORMAL HIGH (ref 12.0–15.0)
MCH: 31.6 pg (ref 26.0–34.0)
MCHC: 33.1 g/dL (ref 30.0–36.0)
MCV: 95.5 fL (ref 80.0–100.0)
Platelets: 186 10*3/uL (ref 150–400)
RBC: 4.9 MIL/uL (ref 3.87–5.11)
RDW: 13.2 % (ref 11.5–15.5)
WBC: 9.7 10*3/uL (ref 4.0–10.5)
nRBC: 0 % (ref 0.0–0.2)

## 2019-09-24 LAB — BASIC METABOLIC PANEL
Anion gap: 7 (ref 5–15)
BUN: 13 mg/dL (ref 6–20)
CO2: 27 mmol/L (ref 22–32)
Calcium: 9.4 mg/dL (ref 8.9–10.3)
Chloride: 103 mmol/L (ref 98–111)
Creatinine, Ser: 0.81 mg/dL (ref 0.44–1.00)
GFR calc Af Amer: >60 mL/min (ref >60)
GFR calc non Af Amer: >60 mL/min (ref >60)
Glucose, Bld: 97 mg/dL (ref 70–99)
Potassium: 4.7 mmol/L (ref 3.5–5.1)
Sodium: 137 mmol/L (ref 135–145)

## 2019-09-24 LAB — TROPONIN I (HIGH SENSITIVITY): Troponin I (High Sensitivity): 3 ng/L (ref <18)

## 2019-09-24 LAB — BRAIN NATRIURETIC PEPTIDE: B Natriuretic Peptide: 155.6 pg/mL — ABNORMAL HIGH (ref 0.0–100.0)

## 2019-09-24 LAB — I-STAT BETA HCG BLOOD, ED (MC, WL, AP ONLY): I-stat hCG, quantitative: <5 m[IU]/mL (ref <5)

## 2019-09-24 MED ORDER — ALBUTEROL SULFATE HFA 108 (90 BASE) MCG/ACT IN AERS
6.0000 | INHALATION_SPRAY | Freq: Once | RESPIRATORY_TRACT | Status: AC
  Administered 2019-09-24: 6 via RESPIRATORY_TRACT
  Filled 2019-09-24: qty 6.7

## 2019-09-24 MED ORDER — PREDNISONE 20 MG PO TABS
60.0000 mg | ORAL_TABLET | Freq: Once | ORAL | Status: AC
  Administered 2019-09-24: 60 mg via ORAL
  Filled 2019-09-24: qty 3

## 2019-09-24 MED ORDER — IPRATROPIUM BROMIDE HFA 17 MCG/ACT IN AERS
2.0000 | INHALATION_SPRAY | Freq: Once | RESPIRATORY_TRACT | Status: AC
  Administered 2019-09-24: 2 via RESPIRATORY_TRACT
  Filled 2019-09-24: qty 12.9

## 2019-09-24 MED ORDER — PREDNISONE 50 MG PO TABS: 50.0000 mg | ORAL_TABLET | Freq: Every day | ORAL | 0 refills | Status: AC

## 2019-09-24 NOTE — ED Triage Notes (Signed)
Pt reports being dizzy for a few weeks to the point where she almost fell. States yesterday she was very tried and lethargic, mainly just slept all day. Endorses intermittent CP, none at this time.

## 2019-09-24 NOTE — ED Notes (Signed)
Lab to add on BNP.  °

## 2019-09-24 NOTE — Discharge Instructions (Signed)
Please take your prednisone, as prescribed.  Please also restart nebulizer therapy for your COPD.  I encourage you to follow-up with your PCP regarding today's visit.  Please also get established with a new cardiologist given you are no longer being followed by cardiology.  Return to the ED or seek medical attention for any new or worsening symptoms.  You have also been tested for COVID-19.  Please continue to isolate, pending results.   If you live with, or provide care at home for, a person confirmed to have, or being evaluated for, COVID-19 infection please follow these guidelines to prevent infection:  Follow healthcare providers instructions Make sure that you understand and can help the patient follow any healthcare provider instructions for all care.  Provide for the patients basic needs You should help the patient with basic needs in the home and provide support for getting groceries, prescriptions, and other personal needs.  Monitor the patients symptoms If they are getting sicker, call his or her medical provider a  This will help the healthcare providers office take steps to keep other people from getting infected. Ask the healthcare provider to call the local or state health department.  Limit the number of people who have contact with the patient If possible, have only one caregiver for the patient. Other household members should stay in another home or place of residence. If this is not possible, they should stay in another room, or be separated from the patient as much as possible. Use a separate bathroom, if available. Restrict visitors who do not have an essential need to be in the home.  Keep older adults, very young children, and other sick people away from the patient Keep older adults, very young children, and those who have compromised immune systems or chronic health conditions away from the patient. This includes people with chronic heart, lung, or kidney  conditions, diabetes, and cancer.  Ensure good ventilation Make sure that shared spaces in the home have good air flow, such as from an air conditioner or an opened window, weather permitting.  Wash your hands often Wash your hands often and thoroughly with soap and water for at least 20 seconds. You can use an alcohol based hand sanitizer if soap and water are not available and if your hands are not visibly dirty. Avoid touching your eyes, nose, and mouth with unwashed hands. Use disposable paper towels to dry your hands. If not available, use dedicated cloth towels and replace them when they become wet.  Wear a facemask and gloves Wear a disposable facemask at all times in the room and gloves when you touch or have contact with the patients blood, body fluids, and/or secretions or excretions, such as sweat, saliva, sputum, nasal mucus, vomit, urine, or feces.  Ensure the mask fits over your nose and mouth tightly, and do not touch it during use. Throw out disposable facemasks and gloves after using them. Do not reuse. Wash your hands immediately after removing your facemask and gloves. If your personal clothing becomes contaminated, carefully remove clothing and launder. Wash your hands after handling contaminated clothing. Place all used disposable facemasks, gloves, and other waste in a lined container before disposing them with other household waste. Remove gloves and wash your hands immediately after handling these items.  Do not share dishes, glasses, or other household items with the patient Avoid sharing household items. You should not share dishes, drinking glasses, cups, eating utensils, towels, bedding, or other items After the person uses  these items, you should wash them thoroughly with soap and water.  Wash laundry thoroughly Immediately remove and wash clothes or bedding that have blood, body fluids, and/or secretions or excretions, such as sweat, saliva, sputum, nasal mucus,  vomit, urine, or feces, on them. Wear gloves when handling laundry from the patient. Read and follow directions on labels of laundry or clothing items and detergent. In general, wash and dry with the warmest temperatures recommended on the label.  Clean all areas the individual has used often Clean all touchable surfaces, such as counters, tabletops, doorknobs, bathroom fixtures, toilets, phones, keyboards, tablets, and bedside tables, every day. Also, clean any surfaces that may have blood, body fluids, and/or secretions or excretions on them. Wear gloves when cleaning surfaces the patient has come in contact with. Use a diluted bleach solution (e.g., dilute bleach with 1 part bleach and 10 parts water) or a household disinfectant with a label that says EPA-registered for coronaviruses. To make a bleach solution at home, add 1 tablespoon of bleach to 1 quart (4 cups) of water. For a larger supply, add  cup of bleach to 1 gallon (16 cups) of water. Read labels of cleaning products and follow recommendations provided on product labels. Labels contain instructions for safe and effective use of the cleaning product including precautions you should take when applying the product, such as wearing gloves or eye protection and making sure you have good ventilation during use of the product. Remove gloves and wash hands immediately after cleaning.  Monitor yourself for signs and symptoms of illness Caregivers and household members are considered close contacts, should monitor their health, and will be asked to limit movement outside of the home to the extent possible. Follow the monitoring steps for close contacts listed on the symptom monitoring form.   ? If you have additional questions, contact your local health department or call the epidemiologist on call at [phone] (available 24/7). ? This guidance is subject to change. For the most up-to-date guidance from [facility], please refer to their  website: YouBlogs.pl

## 2019-09-24 NOTE — ED Provider Notes (Signed)
[facility] EMERGENCY DEPARTMENT Provider Note   CSN: [id number] Arrival date & time: 09/24/19  1308     History   Chief Complaint Chief Complaint  Patient presents with   Dizziness    HPI Tina Fitzgerald is a 48 y.o. female with PMH significant for anxiety, PE, cardiac catheterization and tricuspid valve surgery, COPD, tobacco use, and mild memory impairment who presents to the ED with a 2-week history of fatigue, weakness, intermittent headaches, rhinorrhea, cough, wheezing, and intermittent chest discomfort.  Patient reports that her chest pain is sharp, 5 out of 10 centrally located, nonradiating, and resolves in approximately 5 to 10 seconds.  She reports that it is associated with ambulation.  She is also mildly lightheaded when these episodes arise.  She also endorses an approximate 40 pound weight gain over the course of the past 6 months.  She used to be followed by cardiology, but needs to be established once again.  She also reports that she has not been taking her nebulizer treatments for her COPD and she suspects that that may be contributing to her shortness of breath and cough symptoms.  She denies any room spinning dizziness, difficulty breathing, abdominal discomfort, nausea or vomiting, changes in bowel habits, urinary symptoms, or diminished appetite.     HPI  Past Medical History:  Diagnosis Date   Anxiety    ARF (acute renal failure) ([place])    a. 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   Arthritis    "knees" (05/09/2013)   Asthma    Cardiac arrest ([place])    a. Asystolic arrest 4315 in setting of TV endocarditis, pericardial tamponade.   Cardiac tamponade    a. s/p subxiphoid window 2007 in setting of TV endocarditis. b. She had suffered a mild anoxic injury during her recent hospitalization felt to be related to hypoperfusion /notes 06/15/2006 (05/09/2013)   Chronic headaches    Depression    Endocarditis      a. 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 s/p closure in 2007.   Septic pulmonary embolism ([place])    a. During admission for TV endocarditis.   Severe tricuspid regurgitation    a. By TEE/cath 04/2013 - for outpt referral to [facility].    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 04/17/2014   Depression 01/16/2014   GERD (gastroesophageal reflux disease) 01/16/2014   COPD (chronic obstructive pulmonary disease) ([place]) 01/16/2014   Severe tricuspid regurgitation 05/24/2013   Tobacco abuse 04/16/2013    Past Surgical History:  Procedure Laterality Date   CARDIAC CATHETERIZATION  multiple/notes 06/15/2006 (05/09/2013)   DILATION AND CURETTAGE OF UTERUS     "couple of those years ago; one a couple years ago after I lost a pregnancy" (05/09/2013)   LEFT AND RIGHT HEART CATHETERIZATION WITH CORONARY ANGIOGRAM N/A 05/08/2013   Procedure: LEFT AND RIGHT HEART CATHETERIZATION WITH CORONARY ANGIOGRAM;  Surgeon: Kathleene Hazel, MD;  Location: [facility] CATH LAB;  Service: Cardiovascular;  Laterality: N/A;    PATENT FORAMEN OVALE CLOSURE  04/22/2006   closed during tricuspid valve surgery   SUBXYPHOID PERICARDIAL WINDOW  05/07/2006   for large postoperative pericardial effusion    TEE WITHOUT CARDIOVERSION N/A 05/08/2013   Procedure: TRANSESOPHAGEAL ECHOCARDIOGRAM (TEE);  Surgeon: Lewayne Bunting, MD;  Location: [facility] ENDOSCOPY;  Service: Cardiovascular;  Laterality: N/A;   TRICUSPID VALVE SURGERY  04/22/2006   debridement of septal leaflet for MSSA bacterial endocarditis with closure PFO     OB History    Gravida  6   Para  5   Term  5   Preterm      AB  1   Living  6     SAB  1   TAB      Ectopic      Multiple  1   Live Births  6            Home Medications    Prior to Admission medications   Medication Sig Start Date End Date Taking? Authorizing Provider  Ascorbic Acid (VITAMIN C WITH ROSE HIPS) 500 MG tablet Take 1 tablet (500 mg total) by mouth daily. Patient taking differently: Take 1,000 mg by mouth daily.  02/13/14   Newt Lukes, MD  diazepam (VALIUM) 10 MG tablet Take 10 mg by mouth 2 (two) times daily as needed for anxiety.    [provider]  famotidine (PEPCID) 20 MG tablet Take 20 mg by mouth daily.    [provider]  ibuprofen (ADVIL,MOTRIN) 800 MG tablet Take 1 tablet by mouth every 8 (eight) hours as needed. 09/24/15   [provider]  Multiple Vitamins-Calcium (ONE-A-DAY WOMENS FORMULA PO) Take 1 tablet by mouth daily.    [provider]  Omega-3 Fatty Acids (FISH OIL) 1000 MG CAPS Take 3,000 mg by mouth daily.     [provider]  potassium gluconate 595 MG TABS tablet Take 1 tablet (595 mg total) by mouth every other day. 02/13/14   Newt Lukes, MD  predniSONE (DELTASONE) 50 MG tablet Take 1 tablet (50 mg total) by mouth daily with breakfast for 5 days. 09/24/19 09/29/19  Lorelee New, PA-C  PROAIR HFA 108 (90 BASE) MCG/ACT inhaler Inhale 1 puff into the lungs as needed. 09/03/15   [provider]  sertraline (ZOLOFT) 100 MG tablet Take 1 tablet (100 mg total) by mouth daily. 01/16/14   Ascencion Dike, PA-C  tamsulosin (FLOMAX) 0.4 MG CAPS capsule Take 1 capsule by mouth daily. 09/03/15   [provider]  Tiotropium Bromide Monohydrate (SPIRIVA RESPIMAT) 2.5 MCG/ACT AERS Inhale 2 puffs into the lungs daily. 06/22/14   Etta Grandchild, MD  zinc gluconate 50 MG tablet Take 50 mg by mouth every 7 (seven) days. On Sundays    [provider]    Family History Family History  Problem Relation Age of Onset   Hyperlipidemia Mother    Hyperlipidemia Brother    Alcohol abuse Father    Arthritis Father    Drug abuse Father    Heart disease Maternal  Grandmother    Diabetes Daughter    Ovarian cancer Cousin    Mental illness Other    Cancer Neg Hx    Early death Neg Hx    Hypertension Neg Hx    Kidney disease Neg Hx    Learning disabilities Neg Hx    Stroke Neg Hx     Social History Social History   Tobacco Use   Smoking status: Current Every Day Smoker    Packs/day: 0.75    Years: 30.00    Pack years: 22.50    Types: Cigarettes   Smokeless tobacco: Never Used  Substance Use Topics   Alcohol use: Yes    Alcohol/week: 7.0 standard drinks    Types: 7 Cans of beer per week   Drug use: Yes    Types: Marijuana, Heroin, "Crack" cocaine, Cocaine    Comment: 05/09/2013 "<1 joint/day; last heroin & crack was before heart OR in 2007"     Allergies   Amoxicillin, Latex, and Penicillins   Review of Systems Review of Systems  All other systems reviewed and are negative.    Physical Exam Updated Vital Signs BP 138/90    Pulse 70    Temp 98.1 F (36.7 C) (Oral)    Resp 15    Ht 5' 5.5" (1.664 m)    Wt 89.8 kg    LMP 09/03/2019    SpO2 99%    BMI 32.45 kg/m   Physical Exam Vitals signs and nursing note reviewed. Exam conducted with a chaperone present.  Constitutional:      Appearance: Normal appearance.  HENT:     Head:  Normocephalic and atraumatic.  Eyes:     General: No scleral icterus.    Conjunctiva/sclera: Conjunctivae normal.  Neck:     Musculoskeletal: Normal range of motion and neck supple.  Cardiovascular:     Rate and Rhythm: Normal rate and regular rhythm.     Pulses: Normal pulses.     Heart sounds: Normal heart sounds.  Pulmonary:     Effort: Pulmonary effort is normal. No respiratory distress.     Comments: Wheezes auscultated bilaterally. Abdominal:     General: Abdomen is flat. There is no distension.     Palpations: Abdomen is soft.     Tenderness: There is no abdominal tenderness. There is no guarding.  Skin:    General: Skin is dry.  Neurological:     Mental Status: She is alert and oriented to person, place, and time.     GCS: GCS eye subscore is 4. GCS verbal subscore is 5. GCS motor subscore is 6.  Psychiatric:        Mood and Affect: Mood normal.        Behavior: Behavior normal.        Thought Content: Thought content normal.      ED Treatments / Results  Labs (all labs ordered are listed, but only abnormal results are displayed) Labs Reviewed  CBC - Abnormal; Notable for the following components:      Result Value   Hemoglobin 15.5 (*)    HCT 46.8 (*)    All other components within normal limits  BRAIN NATRIURETIC PEPTIDE - Abnormal; Notable for the following components:   B Natriuretic Peptide 155.6 (*)    All other components within normal limits  NOVEL CORONAVIRUS, NAA (HOSP ORDER, SEND-OUT TO REF LAB; TAT 18-24 HRS)  BASIC METABOLIC PANEL  I-STAT BETA HCG BLOOD, ED ([facility], [facility], [facility] ONLY)  TROPONIN  I (HIGH SENSITIVITY)    EKG EKG Interpretation  Date/Time:  Monday September 24 2019 13:16:58 EST Ventricular Rate:  85 PR Interval:  128 QRS Duration: 86 QT Interval:  380 QTC Calculation: 452 R Axis:   57 Text Interpretation: Normal sinus rhythm Right atrial enlargement Borderline ECG  RBBB, No STEMI Confirmed by Alvester Chourifan, Matthew [phone]([id number]) on 09/24/2019  1:30:52 PM   Radiology Dg Chest Portable 1 View  Result Date: 09/24/2019 CLINICAL DATA:  Shortness of breath and cough. Fatigue and dizziness. EXAM: PORTABLE CHEST 1 VIEW COMPARISON:  Chest x-ray dated 11/02/2016 FINDINGS: Heart size and pulmonary vascularity are normal and the lungs are clear. Previous median sternotomy. Previous tricuspid valve surgery and closure of patent foramen ovale. Bones are normal. IMPRESSION: No active disease. Electronically Signed   By: Francene BoyersJames  Maxwell M.D.   On: 09/24/2019 17:53    Procedures Procedures (including critical care time)  Medications Ordered in ED Medications  albuterol (VENTOLIN HFA) 108 (90 Base) MCG/ACT inhaler 6 puff (has no administration in time range)  ipratropium (ATROVENT HFA) inhaler 2 puff (has no administration in time range)  predniSONE (DELTASONE) tablet 60 mg (has no administration in time range)     Initial Impression / Assessment and Plan / ED Course  I have reviewed the triage vital signs and the nursing notes.  Pertinent labs & imaging results that were available during my care of the patient were reviewed by me and considered in my medical decision making (see chart for details).        While BNP mildly elevated, no JVD or evidence of edema.  High-sensitivity troponin was normal which would be elevated if ACS given her chronicity of intermittent chest discomfort.  CBC demonstrated no anemia or leukocytosis concerning for infection which could otherwise contribute to her symptoms of weakness and fatigue.  Her BMP was also reassuring and demonstrated no renal impairment or electrolyte abnormalities.  Patient was very reassured by the work-up.  Given her bilateral wheezing auscultated on physical exam, will provide her with albuterol and ipratropium as well as Deltasone 60 mg here in the ED.  Her fianc is in the room and states that he should be able to find her nebulizer and they have plenty of treatments at home.  Will  prescribe her 5 days of prednisone burst for continued relief of her suspected COPD exacerbation.  Given her symptoms, we will also obtain COVID-19 testing.  On reexamination, patient is feeling notably improved.  She is sitting up in bed alert and her wheezing has improved significantly.  Strict return precautions discussed with the patient.  She plans [facility] and is in the process of getting established with a new cardiologist.  All of the evaluation and work-up results were discussed with the patient and any family at bedside. They were provided opportunity to ask any additional questions and have none at this time. They have expressed understanding of verbal discharge instructions as well as return precautions and are agreeable to the plan.   Tina Fitzgerald was evaluated in Emergency Department on 09/24/2019 for the symptoms described in the history of present illness. She was evaluated in the context of the global COVID-19 pandemic, which necessitated consideration that the patient might be at risk for infection with the SARS-CoV-2 virus that causes COVID-19. Institutional protocols and algorithms that pertain to the evaluation of patients at risk for COVID-19 are in a state of rapid change based on information released by  regulatory bodies including the CDC and federal and state organizations. These policies and algorithms were followed during the patient's care in the ED.   Final Clinical Impressions(s) / ED Diagnoses   Final diagnoses:  Cough    ED Discharge Orders         Ordered    predniSONE (DELTASONE) 50 MG tablet  Daily with breakfast     09/24/19 1908           Elvera Maria 09/24/19 1926    Raeford Razor, MD 09/24/19 2214

## 2019-09-26 LAB — NOVEL CORONAVIRUS, NAA (HOSP ORDER, SEND-OUT TO REF LAB; TAT 18-24 HRS): SARS-CoV-2, NAA: NOT DETECTED

## 2019-10-03 ENCOUNTER — Other Ambulatory Visit: Payer: Self-pay

## 2019-10-03 ENCOUNTER — Emergency Department (HOSPITAL_COMMUNITY): Payer: Medicare Other

## 2019-10-03 ENCOUNTER — Emergency Department (HOSPITAL_COMMUNITY)
Admission: EM | Admit: 2019-10-03 | Discharge: 2019-10-03 | Disposition: A | Discharge status: Home / Self Care | Payer: Medicare Other | Attending: Emergency Medicine | Admitting: Emergency Medicine

## 2019-10-03 DIAGNOSIS — R091 Pleurisy: Secondary | ICD-10-CM | POA: Diagnosis present

## 2019-10-03 DIAGNOSIS — Z91040 Latex allergy status: Secondary | ICD-10-CM | POA: Insufficient documentation

## 2019-10-03 DIAGNOSIS — Z79899 Other long term (current) drug therapy: Secondary | ICD-10-CM | POA: Insufficient documentation

## 2019-10-03 DIAGNOSIS — J449 Chronic obstructive pulmonary disease, unspecified: Secondary | ICD-10-CM | POA: Insufficient documentation

## 2019-10-03 DIAGNOSIS — J441 Chronic obstructive pulmonary disease with (acute) exacerbation: Secondary | ICD-10-CM | POA: Diagnosis not present

## 2019-10-03 DIAGNOSIS — F17210 Nicotine dependence, cigarettes, uncomplicated: Secondary | ICD-10-CM | POA: Diagnosis not present

## 2019-10-03 DIAGNOSIS — Z7982 Long term (current) use of aspirin: Secondary | ICD-10-CM | POA: Insufficient documentation

## 2019-10-03 LAB — CBC
HCT: 42.7 % (ref 36.0–46.0)
Hemoglobin: 14.7 g/dL (ref 12.0–15.0)
MCH: 32.2 pg (ref 26.0–34.0)
MCHC: 34.4 g/dL (ref 30.0–36.0)
MCV: 93.6 fL (ref 80.0–100.0)
Platelets: 157 10*3/uL (ref 150–400)
RBC: 4.56 MIL/uL (ref 3.87–5.11)
RDW: 13.9 % (ref 11.5–15.5)
WBC: 21 10*3/uL — ABNORMAL HIGH (ref 4.0–10.5)
nRBC: 0 % (ref 0.0–0.2)

## 2019-10-03 LAB — BASIC METABOLIC PANEL
Anion gap: 11 (ref 5–15)
BUN: 16 mg/dL (ref 6–20)
CO2: 21 mmol/L — ABNORMAL LOW (ref 22–32)
Calcium: 8.9 mg/dL (ref 8.9–10.3)
Chloride: 101 mmol/L (ref 98–111)
Creatinine, Ser: 1.1 mg/dL — ABNORMAL HIGH (ref 0.44–1.00)
GFR calc Af Amer: >60 mL/min (ref >60)
GFR calc non Af Amer: 59 mL/min — ABNORMAL LOW (ref >60)
Glucose, Bld: 118 mg/dL — ABNORMAL HIGH (ref 70–99)
Potassium: 3.8 mmol/L (ref 3.5–5.1)
Sodium: 133 mmol/L — ABNORMAL LOW (ref 135–145)

## 2019-10-03 LAB — I-STAT BETA HCG BLOOD, ED (MC, WL, AP ONLY): I-stat hCG, quantitative: <5 m[IU]/mL (ref <5)

## 2019-10-03 LAB — TROPONIN I (HIGH SENSITIVITY)
Troponin I (High Sensitivity): 15 ng/L (ref <18)
Troponin I (High Sensitivity): 7 ng/L (ref <18)

## 2019-10-03 MED ORDER — DOXYCYCLINE HYCLATE 100 MG PO CAPS: 100.0000 mg | ORAL_CAPSULE | Freq: Two times a day (BID) | ORAL | 0 refills | Status: DC

## 2019-10-03 MED ORDER — ALBUTEROL SULFATE (2.5 MG/3ML) 0.083% IN NEBU: 2.5000 mg | INHALATION_SOLUTION | RESPIRATORY_TRACT | 0 refills | Status: DC | PRN

## 2019-10-03 MED ORDER — PREDNISONE 20 MG PO TABS: 40.0000 mg | ORAL_TABLET | Freq: Every day | ORAL | 0 refills | Status: DC

## 2019-10-03 MED ORDER — METHYLPREDNISOLONE SODIUM SUCC 125 MG IJ SOLR
80.0000 mg | Freq: Once | INTRAMUSCULAR | Status: AC
  Administered 2019-10-03: 80 mg via INTRAVENOUS
  Filled 2019-10-03: qty 2

## 2019-10-03 MED ORDER — DOXYCYCLINE HYCLATE 100 MG PO TABS
100.0000 mg | ORAL_TABLET | Freq: Once | ORAL | Status: AC
  Administered 2019-10-03: 100 mg via ORAL
  Filled 2019-10-03: qty 1

## 2019-10-03 MED ORDER — SODIUM CHLORIDE 0.9% FLUSH: 3.0000 mL | Freq: Once | INTRAVENOUS | Status: DC

## 2019-10-03 MED ORDER — SODIUM CHLORIDE 0.9 % IV BOLUS
1000.0000 mL | Freq: Once | INTRAVENOUS | Status: AC
  Administered 2019-10-03: 1000 mL via INTRAVENOUS

## 2019-10-03 MED ORDER — IOHEXOL 350 MG/ML SOLN
100.0000 mL | Freq: Once | INTRAVENOUS | Status: AC | PRN
  Administered 2019-10-03: 100 mL via INTRAVENOUS

## 2019-10-03 NOTE — ED Notes (Signed)
To ct

## 2019-10-03 NOTE — ED Notes (Signed)
Pt walked to br with pulse ox sats stayed 95-97% on room air  Sl sob  With exertion

## 2019-10-03 NOTE — Discharge Instructions (Signed)
Please use inhalers for shortness of breath and wheezing Take Prednisone for the next 5 days Take Doxycycline twice daily for the next week Follow up with your doctor Please return if you are worsening

## 2019-10-03 NOTE — ED Provider Notes (Signed)
[facility] EMERGENCY DEPARTMENT Provider Note   CSN: [id number] Arrival date & time: 10/03/19  1414     History   Chief Complaint Chief Complaint  Patient presents with  . Pleurisy    HPI Tina Fitzgerald is a 48 y.o. female with history of severe tricuspid regurg, Hep C, hx of PE, endocarditis, COPD presents with chest pain and SOB. Was seen 11/23 for similar symptoms and discharged home with steroids, inhalers, and abx for likely COPD exacerbation. She had a negative COVID test. She states she felt better for about 1 day and symptoms returned. She is having on and off fevers, fatigue, loss of appetite, polydipsia, a productive cough, wheezing, SOB, and left sided chest pain and left upper back pain. She also has had a couple episodes of vomiting and diarrhea. Last night she couldn't sleep and she took some theraflu PM this morning. When she woke up she had a throbbing headache and decided to come to the ED. She denies anosmia, sore throat, current chest pain, abdominal pain, nausea, urinary symptoms. She states that she has not been smoking since she's been sick which has been about 10 days.    HPI  Past Medical History:  Diagnosis Date  . Anxiety   . ARF (acute renal failure) ([place])    a. 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  . Arthritis    "knees" (05/09/2013)  . Asthma   . Cardiac arrest ([place])    a. Asystolic arrest 7106 in setting of TV endocarditis, pericardial tamponade.  . Cardiac tamponade    a. s/p subxiphoid window 2007 in setting of TV endocarditis. b. She had suffered a mild anoxic injury during her recent hospitalization felt to be related to hypoperfusion Archie Endo 06/15/2006 (05/09/2013)  . Chronic headaches   . Depression   . Endocarditis    a. 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  Marland Kitchen GERD (gastroesophageal reflux disease)   . Hepatitis B carrier ([place])   . Hepatitis C   . History of tricuspid valve disorder 04/16/2013  . Hx: UTI (urinary tract infection)   . Memory loss, short term    "since OHS in 2007" (05/09/2013)  . PFO (patent foramen ovale)    a. s/p closure in 2007.  Marland Kitchen Septic pulmonary embolism ([place])    a. During admission for TV endocarditis.  . Severe tricuspid regurgitation    a. By TEE/cath 04/2013 - for outpt referral to [facility].   . Tobacco abuse     Patient Active Problem List   Diagnosis Date Noted  . Routine general medical examination at a health care facility 04/18/2014  . Need for prophylactic vaccination against Streptococcus pneumoniae (pneumococcus) 04/18/2014  . Other abnormal glucose 04/17/2014  . Hep C w/o coma, chronic ([place]) 04/17/2014  . 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 Depression 01/16/2014  . GERD (gastroesophageal reflux disease) 01/16/2014  . COPD (chronic obstructive pulmonary disease) ([place]) 01/16/2014  . Severe tricuspid regurgitation 05/24/2013  . Tobacco abuse 04/16/2013    Past Surgical History:  Procedure Laterality Date  . CARDIAC CATHETERIZATION     multiple/notes 06/15/2006 (05/09/2013)  . DILATION AND CURETTAGE OF UTERUS     "couple of those years ago; one  a couple years ago after I lost a pregnancy" (05/09/2013)  . LEFT AND RIGHT HEART CATHETERIZATION WITH CORONARY ANGIOGRAM N/A 05/08/2013   Procedure: LEFT AND RIGHT HEART CATHETERIZATION WITH CORONARY ANGIOGRAM;  Surgeon: Kathleene Hazel, MD;  Location: [facility] CATH LAB;  Service: Cardiovascular;  Laterality: N/A;  . PATENT FORAMEN OVALE CLOSURE  04/22/2006   closed during tricuspid valve surgery  . SUBXYPHOID PERICARDIAL WINDOW  05/07/2006   for large postoperative pericardial effusion   . TEE  WITHOUT CARDIOVERSION N/A 05/08/2013   Procedure: TRANSESOPHAGEAL ECHOCARDIOGRAM (TEE);  Surgeon: Lewayne Bunting, MD;  Location: [facility] ENDOSCOPY;  Service: Cardiovascular;  Laterality: N/A;  . TRICUSPID VALVE SURGERY  04/22/2006   debridement of septal leaflet for MSSA bacterial endocarditis with closure PFO     OB History    Gravida  6   Para  5   Term  5   Preterm      AB  1   Living  6     SAB  1   TAB      Ectopic      Multiple  1   Live Births  6            Home Medications    Prior to Admission medications   Medication Sig Start Date End Date Taking? Authorizing Provider  albuterol (VENTOLIN HFA) 108 (90 Base) MCG/ACT inhaler Inhale 5 puffs into the lungs 2 (two) times daily.   Yes [provider]  Ascorbic Acid (VITAMIN C WITH ROSE HIPS) 500 MG tablet Take 1 tablet (500 mg total) by mouth daily. Patient taking differently: Take 1,000 mg by mouth daily.  02/13/14  Yes Newt Lukes, MD  aspirin 81 MG chewable tablet Chew 81 mg by mouth daily.   Yes [provider]  buPROPion (WELLBUTRIN XL) 150 MG 24 hr tablet Take 150 mg by mouth daily. 08/17/19  Yes [provider]  diazepam (VALIUM) 10 MG tablet Take 10 mg by mouth 2 (two) times daily as needed for anxiety.   Yes [provider]  famotidine (PEPCID) 20 MG tablet Take 20 mg by mouth daily.   Yes [provider]  ibuprofen (ADVIL,MOTRIN) 800 MG tablet Take 1 tablet by mouth every 8 (eight) hours as needed for mild pain.  09/24/15  Yes [provider]  ipratropium (ATROVENT HFA) 17 MCG/ACT inhaler Inhale 2 puffs into the lungs every 6 (six) hours as needed for wheezing.   Yes [provider]  Multiple Vitamins-Calcium (ONE-A-DAY WOMENS FORMULA PO) Take 1 tablet by mouth daily.   Yes [provider]  Omega-3 Fatty Acids (FISH OIL) 1000 MG CAPS Take 3,000 mg by mouth daily.    Yes [provider]  potassium gluconate 595 MG TABS  tablet Take 1 tablet (595 mg total) by mouth every other day. 02/13/14  Yes Newt Lukes, MD  sertraline (ZOLOFT) 100 MG tablet Take 1 tablet (100 mg total) by mouth daily. 01/16/14  Yes Ascencion Dike, PA-C  Tiotropium Bromide Monohydrate (SPIRIVA RESPIMAT) 2.5 MCG/ACT AERS Inhale 2 puffs into the lungs daily. 06/22/14  Yes Etta Grandchild, MD    Family History Family History  Problem Relation Age of Onset  . Hyperlipidemia Mother   . Hyperlipidemia Brother   . Alcohol abuse Father   . Arthritis Father   . Drug abuse Father   . Heart disease Maternal Grandmother   . Diabetes Daughter   . Ovarian cancer Cousin   . Mental  illness Other   . Cancer Neg Hx   . Early death Neg Hx   . Hypertension Neg Hx   . Kidney disease Neg Hx   . Learning disabilities Neg Hx   . Stroke Neg Hx     Social History Social History   Tobacco Use  . Smoking status: Current Every Day Smoker    Packs/day: 0.75    Years: 30.00    Pack years: 22.50    Types: Cigarettes  . Smokeless tobacco: Never Used  Substance Use Topics  . Alcohol use: Yes    Alcohol/week: 7.0 standard drinks    Types: 7 Cans of beer per week  . Drug use: Yes    Types: Marijuana, Heroin, "Crack" cocaine, Cocaine    Comment: 05/09/2013 "<1 joint/day; last heroin & crack was before heart OR in 2007"     Allergies   Amoxicillin, Latex, and Penicillins   Review of Systems Review of Systems  Constitutional: Positive for activity change, appetite change, fatigue and fever.  HENT: Positive for rhinorrhea.   Respiratory: Positive for cough, shortness of breath and wheezing.   Cardiovascular: Positive for chest pain. Negative for leg swelling.  Gastrointestinal: Positive for diarrhea, nausea and vomiting. Negative for abdominal pain.  Endocrine: Positive for polydipsia.  Genitourinary: Negative for dysuria.  Musculoskeletal: Negative for back pain.  Neurological: Positive for headaches.  Psychiatric/Behavioral: Positive  for sleep disturbance.  All other systems reviewed and are negative.    Physical Exam Updated Vital Signs BP 110/74 (BP Location: Left Arm)   Pulse (!) 109   Temp 99 F (37.2 C) (Oral)   Resp 18   Ht 5\' 5"  (1.651 m)   Wt 90.7 kg   LMP 09/03/2019   SpO2 96%   BMI 33.28 kg/m   Physical Exam Vitals signs and nursing note reviewed.  Constitutional:      General: She is not in acute distress.    Appearance: Normal appearance. She is well-developed. She is not ill-appearing.  HENT:     Head: Normocephalic and atraumatic.  Eyes:     General: No scleral icterus.       Right eye: No discharge.        Left eye: No discharge.     Conjunctiva/sclera: Conjunctivae normal.     Pupils: Pupils are equal, round, and reactive to light.  Neck:     Musculoskeletal: Normal range of motion.  Cardiovascular:     Rate and Rhythm: Tachycardia present.  Pulmonary:     Effort: Pulmonary effort is normal. No respiratory distress.     Breath sounds: Wheezing and rhonchi present.     Comments: Mildly SOB with talking; coughing Chest:     Chest wall: No tenderness.  Abdominal:     General: There is no distension.     Palpations: Abdomen is soft.     Tenderness: There is no abdominal tenderness.  Skin:    General: Skin is warm and dry.  Neurological:     Mental Status: She is alert and oriented to person, place, and time.  Psychiatric:        Mood and Affect: Mood normal.        Behavior: Behavior normal.      ED Treatments / Results  Labs (all labs ordered are listed, but only abnormal results are displayed) Labs Reviewed  BASIC METABOLIC PANEL - Abnormal; Notable for the following components:      Result Value   Sodium 133 (*)  CO2 21 (*)    Glucose, Bld 118 (*)    Creatinine, Ser 1.10 (*)    GFR calc non Af Amer 59 (*)    All other components within normal limits  CBC - Abnormal; Notable for the following components:   WBC 21.0 (*)    All other components within normal  limits  I-STAT BETA HCG BLOOD, ED ([facility], [facility], [facility] ONLY)  TROPONIN I (HIGH SENSITIVITY)  TROPONIN I (HIGH SENSITIVITY)    EKG None  Radiology Dg Chest 2 View  Result Date: 10/03/2019 CLINICAL DATA:  Cough, fever, chest pain and shortness of breath. EXAM: CHEST - 2 VIEW COMPARISON:  Chest x-rays dated 09/24/2019 and 12/21/2014 FINDINGS: The heart size and mediastinal contours are within normal limits. Both lungs are clear. The visualized skeletal structures are unremarkable. Previous median sternotomy. IMPRESSION: No active cardiopulmonary disease.  No change since the prior exam. Electronically Signed   By: Francene Boyers M.D.   On: 10/03/2019 15:13   Ct Angio Chest Pe W/cm &/or Wo Cm  Result Date: 10/03/2019 CLINICAL DATA:  Right arm pain, cough, congestion, shortness of breath EXAM: CT ANGIOGRAPHY CHEST WITH CONTRAST TECHNIQUE: Multidetector CT imaging of the chest was performed using the standard protocol during bolus administration of intravenous contrast. Multiplanar CT image reconstructions and MIPs were obtained to evaluate the vascular anatomy. CONTRAST:  OMNIPAQUE IOHEXOL 350 MG/ML SOLN COMPARISON:  None. FINDINGS: Cardiovascular: Cardiomegaly. No evidence of aortic aneurysm. No filling defects in the pulmonary arteries to suggest pulmonary emboli. Mediastinum/Nodes: No mediastinal, hilar, or axillary adenopathy. Trachea and esophagus are unremarkable. Lungs/Pleura: Mild emphysema.  Dependent atelectasis.  No effusions. Upper Abdomen: Imaging into the upper abdomen shows no acute findings. Musculoskeletal: Chest wall soft tissues are unremarkable. No acute bony abnormality. Review of the MIP images confirms the above findings. IMPRESSION: No evidence of pulmonary embolus. Cardiomegaly. Aortic Atherosclerosis (ICD10-I70.0) and Emphysema (ICD10-J43.9). Electronically Signed   By: Charlett Nose M.D.   On: 10/03/2019 19:33    Procedures Procedures (including critical care time)   Medications Ordered in ED Medications  sodium chloride flush (NS) 0.9 % injection 3 mL (has no administration in time range)  doxycycline (VIBRA-TABS) tablet 100 mg (has no administration in time range)  sodium chloride 0.9 % bolus 1,000 mL (0 mLs Intravenous Stopped 10/03/19 1839)  methylPREDNISolone sodium succinate (SOLU-MEDROL) 125 mg/2 mL injection 80 mg (80 mg Intravenous Given 10/03/19 1728)  iohexol (OMNIPAQUE) 350 MG/ML injection 100 mL (100 mLs Intravenous Contrast Given 10/03/19 1923)     Initial Impression / Assessment and Plan / ED Course  I have reviewed the triage vital signs and the nursing notes.  Pertinent labs & imaging results that were available during my care of the patient were reviewed by me and considered in my medical decision making (see chart for details).  48 year old female presents with fever, chest pain/SOB, cough, wheezing. Has been ongoing for 2 weeks. Was treated for COPD exacerbation and has not been improving. She still has not been using nebs at home but does have inhalers. She is mildly tachypenic and tachycardic but otherwise vitals are normal. She has diffuse wheezing and rhonchi on lung exam. No peripheral edema noted. EKG is sinus tachycardia. CXR is clear. CBC is remarkable for significant leukocytosis of 21. Possibly worsening infection vs recent steroid use. BMP is remarkable for mild hyponatremia (133), mild elevation of SCr (1.1). 1st trop is 15 and 2nd is 7. She has not been using IV drugs -  doubt endocarditis. She tested negative for COVID.  CTA of chest is negative for PE or obvious pneumonia. Ambulatory sats are 95-97% on RA. Encouraged her to use her nebs at home and will send her home with another round of steroids and doxy. She has a f/u appt with her doctor next week. Advised return if worsening  Final Clinical Impressions(s) / ED Diagnoses   Final diagnoses:  COPD exacerbation [facility])    ED Discharge Orders    None       Beryle Quant 10/03/19 2033    Mancel Bale, MD 10/05/19 1208

## 2019-10-03 NOTE — ED Notes (Signed)
Chest pain since yesterday and she has also had a headache

## 2019-10-03 NOTE — ED Triage Notes (Signed)
Pt reports CP that began this morning. States pain is in her right arm. Pt has had cough, congestion, SOB, fevers for the last couple days. Recently had neg covid test.

## 2019-10-08 ENCOUNTER — Ambulatory Visit: Payer: Medicare Other | Admitting: Cardiology

## 2019-10-12 ENCOUNTER — Telehealth: Payer: Medicare Other | Admitting: Physician Assistant

## 2019-10-15 ENCOUNTER — Ambulatory Visit: Payer: Medicare Other | Admitting: Cardiology

## 2019-11-23 ENCOUNTER — Ambulatory Visit (INDEPENDENT_AMBULATORY_CARE_PROVIDER_SITE_OTHER): Payer: Medicare Other | Admitting: Internal Medicine

## 2019-11-23 ENCOUNTER — Encounter: Payer: Self-pay | Admitting: Internal Medicine

## 2019-11-23 ENCOUNTER — Other Ambulatory Visit: Payer: Self-pay

## 2019-11-23 VITALS — BP 112/64 | HR 95 | Ht 65.0 in | Wt 189.0 lb

## 2019-11-23 DIAGNOSIS — I071 Rheumatic tricuspid insufficiency: Secondary | ICD-10-CM | POA: Diagnosis not present

## 2019-11-23 DIAGNOSIS — Z0000 Encounter for general adult medical examination without abnormal findings: Secondary | ICD-10-CM | POA: Diagnosis not present

## 2019-11-23 LAB — LIPID PANEL
Chol/HDL Ratio: 4.5 ratio — ABNORMAL HIGH (ref 0.0–4.4)
Cholesterol, Total: 189 mg/dL (ref 100–199)
HDL: 42 mg/dL (ref >39)
LDL Chol Calc (NIH): 128 mg/dL — ABNORMAL HIGH (ref 0–99)
Triglycerides: 105 mg/dL (ref 0–149)
VLDL Cholesterol Cal: 19 mg/dL (ref 5–40)

## 2019-11-23 LAB — CBC
Hematocrit: 46.6 % (ref 34.0–46.6)
Hemoglobin: 15.9 g/dL (ref 11.1–15.9)
MCH: 31.5 pg (ref 26.6–33.0)
MCHC: 34.1 g/dL (ref 31.5–35.7)
MCV: 93 fL (ref 79–97)
Platelets: 226 10*3/uL (ref 150–450)
RBC: 5.04 x10E6/uL (ref 3.77–5.28)
RDW: 13 % (ref 11.7–15.4)
WBC: 9.1 10*3/uL (ref 3.4–10.8)

## 2019-11-23 LAB — TSH: TSH: 0.892 u[IU]/mL (ref 0.450–4.500)

## 2019-11-23 NOTE — Progress Notes (Signed)
Cardiology Office Note   Date:  11/23/2019   ID:  Tina Fitzgerald, DOB 20-Jun-1971, MRN [id number]  PCP:  Rometta Emery, MD  Cardiologist:   Dietrich Pates, MD   F/U of tricuspid regurgitation     History of Present Illness: Tina Fitzgerald is a 49 y.o. female with a history of tricuspid regurgitation, hepatitis C, hep B, pulmonary embolus, endocarditis (2007, complicated by cardiac tamponade), COPD.  The patient was previously followed by [facility].  She was last seen in clinic in 2016 The pt has a history of tricuspid valve endocarditis (MSSA) in 2007 due to of polysubstance abuse in the past.   Underwent surgical debridement of the tricuspid valve with drainage  And PFO closure   She also had drainage of  pericardial effusion.    IN 2014 she underwent TEE  Which showed wide open TR   She also had a R / L heart cath that showed no signifi CAD   Mild LV dysfunction  TEE confirmed severe/wide open TR in 2014  Seen by Dr. Cornelius Moras and cardiac surgery plan or recommendation was for conservative therapy.    Repeat Echo in 2015 showed moderate TR.  Again recommendation was for continued antibiotic prophylaxis before dental work or endoscopies.  The patient was seen in the emergency room on December 2 with chest pain, shortness of breath felt consistent with a COPD exacerbation.  She had been admitted in November with similar symptoms.  The patient presents today for continued cardiac follow-up.  She denies chest pain.  Her breathing is steady.  Her husband and she say she has been more fatigued.  Wants to sleep a lot.  Energy down.  She denies palpitations except when she went to the emergency room with her COPD flare in December.  No dizziness.  No syncope.  No lower extremity swelling.     Current Meds  Medication Sig  . albuterol (PROVENTIL) (2.5 MG/3ML) 0.083% nebulizer solution Take 3 mLs (2.5 mg total) by nebulization every 4 (four) hours as needed for wheezing or shortness of  breath.  Marland Kitchen albuterol (VENTOLIN HFA) 108 (90 Base) MCG/ACT inhaler Inhale 5 puffs into the lungs 2 (two) times daily.  . Ascorbic Acid (VITAMIN C WITH ROSE HIPS) 500 MG tablet Take 1 tablet (500 mg total) by mouth daily. (Patient taking differently: Take 1,000 mg by mouth daily. )  . aspirin 81 MG chewable tablet Chew 81 mg by mouth daily.  Marland Kitchen buPROPion (WELLBUTRIN XL) 150 MG 24 hr tablet Take 150 mg by mouth daily.  . diazepam (VALIUM) 10 MG tablet Take 10 mg by mouth 2 (two) times daily as needed for anxiety.  . famotidine (PEPCID) 20 MG tablet Take 20 mg by mouth daily.  Marland Kitchen ibuprofen (ADVIL,MOTRIN) 800 MG tablet Take 1 tablet by mouth every 8 (eight) hours as needed for mild pain.   Marland Kitchen ipratropium (ATROVENT HFA) 17 MCG/ACT inhaler Inhale 2 puffs into the lungs every 6 (six) hours as needed for wheezing.  . Multiple Vitamins-Calcium (ONE-A-DAY WOMENS FORMULA PO) Take 1 tablet by mouth daily.  . Omega-3 Fatty Acids (FISH OIL) 1000 MG CAPS Take 3,000 mg by mouth daily.   . potassium gluconate 595 MG TABS tablet Take 1 tablet (595 mg total) by mouth every other day.  . sertraline (ZOLOFT) 100 MG tablet Take 1 tablet (100 mg total) by mouth daily.  . Tiotropium Bromide Monohydrate (SPIRIVA RESPIMAT) 2.5 MCG/ACT AERS Inhale 2 puffs into the lungs daily.  Allergies:   Amoxicillin, Latex, and Penicillins   Past Medical History:  Diagnosis Date  . Anxiety   . ARF (acute renal failure) ([facility])    a. 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  . Arthritis    "knees" (05/09/2013)  . Asthma   . Cardiac arrest ([facility])    a. 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  . Cardiac tamponade    a. s/p subxiphoid window 2007 in setting of TV endocarditis. b. She had suffered a mild anoxic injury during her recent hospitalization felt to be related to hypoperfusion [name] 06/15/2006 (05/09/2013)  . Chronic headaches   . Depression   . Endocarditis     a. 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  Marland Kitchen GERD (gastroesophageal reflux disease)   . 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 Hepatitis C   . History of tricuspid valve disorder 04/16/2013  . Hx: UTI (urinary tract infection)   . Memory loss, short term    "since OHS in 2007" (05/09/2013)  . PFO (patent foramen ovale)    a. s/p closure in 2007.  Marland Kitchen Septic pulmonary embolism ([facility])    a. During admission for TV endocarditis.  . Severe tricuspid regurgitation    a. By TEE/cath 04/2013 - for outpt referral to [facility].   . Tobacco abuse     Past Surgical History:  Procedure Laterality Date  . CARDIAC CATHETERIZATION     multiple/notes 06/15/2006 (05/09/2013)  . DILATION AND CURETTAGE OF UTERUS     "couple of those years ago; one a couple years ago after I lost a pregnancy" (05/09/2013)  . LEFT AND RIGHT HEART CATHETERIZATION WITH CORONARY ANGIOGRAM N/A 05/08/2013   Procedure: LEFT AND RIGHT HEART CATHETERIZATION WITH CORONARY ANGIOGRAM;  Surgeon: Kathleene Hazel, MD;  Location: [facility] CATH LAB;  Service: Cardiovascular;  Laterality: N/A;  . PATENT FORAMEN OVALE CLOSURE  04/22/2006   closed during tricuspid valve surgery  . SUBXYPHOID PERICARDIAL WINDOW  05/07/2006   for large postoperative pericardial effusion   . TEE WITHOUT CARDIOVERSION N/A 05/08/2013   Procedure: TRANSESOPHAGEAL ECHOCARDIOGRAM (TEE);  Surgeon: Lewayne Bunting, MD;  Location: [facility] ENDOSCOPY;  Service: Cardiovascular;  Laterality: N/A;  . TRICUSPID VALVE SURGERY  04/22/2006   debridement of septal leaflet for MSSA bacterial endocarditis with closure PFO     Social History:  The patient  reports that she has been smoking cigarettes. She has a 22.50  pack-year smoking history. She has never used smokeless tobacco. She reports current alcohol use of about 7.0 standard drinks of alcohol per week. She reports current drug use. Drugs: Marijuana, Heroin, "Crack" cocaine, and Cocaine.   Family History:  The patient's family history includes Alcohol abuse in her father; Arthritis in her father; Diabetes in her daughter; Drug abuse in her father; Heart disease in her maternal grandmother; Hyperlipidemia in her brother and mother; Mental illness in an other family member; Ovarian cancer in her cousin.    ROS:  Please see the history of present illness. All other systems are reviewed and  Negative to the above problem except as noted.    PHYSICAL EXAM: VS:  BP 112/64   Pulse 95   Ht 5\' 5"  (1.651 m)  Wt 189 lb (85.7 kg)   SpO2 98%   BMI 31.45 kg/m   GEN: Well nourished, well developed, in no acute distress  HEENT: normal  Neck: no JVD, carotid bruits, or masses Cardiac: RRR; no murmurs, rubs, or gallops,no edema  Respiratory:  clear to auscultation bilaterally, normal work of breathing GI: soft, nontender, nondistended, + BS  No hepatomegaly  MS: no deformity Moving all extremities   Skin: warm and dry, no rash Neuro:  Strength and sensation are intact Psych: euthymic mood, full affect   EKG:  EKG is not ordered today.  December 2 EKG showed sinus tachycardia 123 bpm.   Lipid Panel    Component Value Date/Time   CHOL 164 01/16/2014 1440   TRIG 90.0 01/16/2014 1440   HDL 47.30 01/16/2014 1440   CHOLHDL 3 01/16/2014 1440   VLDL 18.0 01/16/2014 1440   LDLCALC 99 01/16/2014 1440      Wt Readings from Last 3 Encounters:  11/23/19 189 lb (85.7 kg)  10/03/19 200 lb (90.7 kg)  09/24/19 198 lb (89.8 kg)      ASSESSMENT AND PLAN:  1.  Tricuspid regurgitation.  Patient with a history of endocarditis in the past.  She is status post surgery for this back in 2007 along with a PFO closure and treatment of tamponade.  She was last in  cardiology clinic several years ago.  Last echo in 2017 recommendations have been for medical management.  On examination today there is no signs of volume overload.  No murmur on exam supporting wide open TR.  I would recommend getting another  echocardiogram to reevaluate the right-sided chambers and the tricuspid valve.  2.  COPD significant.  She still is smoking.  I have counseled her on slowing down on this.  Keep on inhalers  3 fatigue we will set up for CBC again (to check normalization of WBC) and also TSH.  4.  Healthcare maintenance we will check lipids today.  Further follow-up will be based on test results.   Current medicines are reviewed at length with the patient today.  The patient does not have concerns regarding medicines.  Signed, Dorris Carnes, MD  11/23/2019 9:35 AM    [address], [place], [place]  [address] Phone: [phone]; Fax: [phone]

## 2019-11-23 NOTE — Patient Instructions (Signed)
Medication Instructions:  No changes *If you need a refill on your cardiac medications before your next appointment, please call your pharmacy*  Lab Work: Today: tsh, cbc, lipid If you have labs (blood work) drawn today and your tests are completely normal, you will receive your results only by: Marland Kitchen MyChart Message (if you have MyChart) OR . A paper copy in the mail If you have any lab test that is abnormal or we need to change your treatment, we will call you to review the results.  Testing/Procedures: Your physician has requested that you have an echocardiogram. Echocardiography is a painless test that uses sound waves to create images of your heart. It provides your doctor with information about the size and shape of your heart and how well your heart's chambers and valves are working. This procedure takes approximately one hour. There are no restrictions for this procedure.  Follow-Up: Based on test results  Other Instructions

## 2019-11-26 ENCOUNTER — Telehealth: Payer: Self-pay | Admitting: Internal Medicine

## 2019-11-26 DIAGNOSIS — Z0000 Encounter for general adult medical examination without abnormal findings: Secondary | ICD-10-CM

## 2019-11-26 DIAGNOSIS — I071 Rheumatic tricuspid insufficiency: Secondary | ICD-10-CM

## 2019-11-26 DIAGNOSIS — Z79899 Other long term (current) drug therapy: Secondary | ICD-10-CM

## 2019-11-26 MED ORDER — ROSUVASTATIN CALCIUM 20 MG PO TABS: 20.0000 mg | ORAL_TABLET | Freq: Every day | ORAL | 3 refills | Status: DC

## 2019-11-26 NOTE — Telephone Encounter (Signed)
The patient has been notified of the result and verbalized understanding.  All questions (if any) were answered. Crestor has been ordered. Labs have been scheduled.  Theresia Majors, RN 11/26/2019 2:57 PM

## 2019-11-26 NOTE — Telephone Encounter (Signed)
Patient returning call for lab results. 

## 2019-12-03 ENCOUNTER — Ambulatory Visit (HOSPITAL_COMMUNITY): Payer: Medicare Other | Attending: Cardiology

## 2019-12-03 ENCOUNTER — Other Ambulatory Visit: Payer: Self-pay

## 2019-12-03 DIAGNOSIS — I071 Rheumatic tricuspid insufficiency: Secondary | ICD-10-CM | POA: Insufficient documentation

## 2019-12-03 DIAGNOSIS — Z0000 Encounter for general adult medical examination without abnormal findings: Secondary | ICD-10-CM | POA: Insufficient documentation

## 2020-04-28 NOTE — Progress Notes (Signed)
Cardiology Office Note    Date:  04/29/2020   ID:  Tina Fitzgerald, DOB 01-Dec-1970, MRN [id number]  PCP:  Rometta Emery, MD  Cardiologist: Dietrich Pates, MD EPS: None  No chief complaint on file.   History of Present Illness:  [name] is a 49 y.o. female with a history of tricuspid regurgitation, hepatitis C, hep B, pulmonary embolus, endocarditis-(MSSA 2007 due to polysubstance abuse, complicated by cardiac tamponade treated with surgical debridement of the tricuspid valve with drainage and PFO closure and drainage of pericardial effusion), COPD.  Some 14 TEE showed wide-open TR, right and left heart cath showed no significant CAD, mild LV dysfunction.  Seen by Dr. Cornelius Moras and continue conservative therapy with antibiotic prophylaxis before dental work or endoscopies.   Patient was last seen in the office by Dr. Tenny Craw 11/23/2019 she had no heart failure on exam and no murmur to support wide open TR.  2D echo 12/03/2019 normal LVEF 60 to 65% status post tricuspid valve repair with history of endocarditis no vegetation noted, moderate to severe tricuspid regurgitation, right ventricle was mildly enlarged right atrial size severely dilated, similar to prior echoes.  LDL 128 started on Crestor.  Patient comes in for f/u. BP high today. She's never had a problem before. Has cut back on salt but eating a lot of frozen dinners and  hot dogs. Smoking 6 cigarettes daily. She had a panic attack trying to get here on time.  Past Medical History:  Diagnosis Date  . Anxiety   . ARF (acute renal failure) (HCC)    a. 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  . Arthritis    "knees" (05/09/2013)  . Asthma   . Cardiac arrest (HCC)    a. 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  . Cardiac tamponade    a. s/p subxiphoid window 2007 in setting of TV endocarditis. b. She had suffered a mild anoxic injury during her recent hospitalization felt  to be related to hypoperfusion [name] 06/15/2006 (05/09/2013)  . Chronic headaches   . Depression   . Endocarditis    a. 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  Marland Kitchen GERD (gastroesophageal reflux disease)   . 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 Hepatitis C   . History of tricuspid valve disorder 04/16/2013  . Hx: UTI (urinary tract infection)   . Memory loss, short term    "since [facility] in 2007" (05/09/2013)  . PFO (patent foramen ovale)    a. s/p closure in 2007.  Marland Kitchen Septic pulmonary embolism (HCC)    a. During admission for TV endocarditis.  . Severe tricuspid regurgitation    a. By TEE/cath 04/2013 - for outpt referral to [facility].   . Tobacco abuse     Past Surgical History:  Procedure Laterality Date  . CARDIAC CATHETERIZATION     multiple/notes 06/15/2006 (05/09/2013)  . DILATION AND CURETTAGE OF UTERUS     "couple of those years ago; one a couple years ago after I lost a pregnancy" (05/09/2013)  . LEFT AND RIGHT HEART CATHETERIZATION WITH CORONARY ANGIOGRAM N/A 05/08/2013   Procedure: LEFT AND RIGHT HEART CATHETERIZATION WITH CORONARY ANGIOGRAM;  Surgeon: Kathleene Hazel, MD;  Location: [facility]  CATH LAB;  Service: Cardiovascular;  Laterality: N/A;  . PATENT FORAMEN OVALE CLOSURE  04/22/2006   closed during tricuspid valve surgery  . SUBXYPHOID PERICARDIAL WINDOW  05/07/2006   for large postoperative pericardial effusion   . TEE WITHOUT CARDIOVERSION N/A 05/08/2013   Procedure: TRANSESOPHAGEAL ECHOCARDIOGRAM (TEE);  Surgeon: Lewayne Bunting, MD;  Location: [facility] ENDOSCOPY;  Service: Cardiovascular;  Laterality: N/A;  . TRICUSPID VALVE SURGERY  04/22/2006   debridement of septal leaflet for MSSA bacterial endocarditis with  closure PFO    Current Medications: Current Meds  Medication Sig  . albuterol (PROVENTIL) (2.5 MG/3ML) 0.083% nebulizer solution Take 3 mLs (2.5 mg total) by nebulization every 4 (four) hours as needed for wheezing or shortness of breath.  Marland Kitchen albuterol (VENTOLIN HFA) 108 (90 Base) MCG/ACT inhaler Inhale 5 puffs into the lungs 2 (two) times daily.  . Ascorbic Acid (VITAMIN C WITH ROSE HIPS) 500 MG tablet Take 1 tablet (500 mg total) by mouth daily. (Patient taking differently: Take 1,000 mg by mouth daily. )  . buPROPion (WELLBUTRIN XL) 150 MG 24 hr tablet Take 150 mg by mouth every 3 (three) days.   . diazepam (VALIUM) 10 MG tablet Take 10 mg by mouth 2 (two) times daily as needed for anxiety.  . famotidine (PEPCID) 20 MG tablet Take 20 mg by mouth daily.  Marland Kitchen ibuprofen (ADVIL,MOTRIN) 800 MG tablet Take 1 tablet by mouth every 8 (eight) hours as needed for mild pain.   Marland Kitchen ipratropium (ATROVENT HFA) 17 MCG/ACT inhaler Inhale 2 puffs into the lungs every 6 (six) hours as needed for wheezing.  . Multiple Vitamins-Calcium (ONE-A-DAY WOMENS FORMULA PO) Take 1 tablet by mouth daily.  . Omega-3 Fatty Acids (FISH OIL) 1000 MG CAPS Take 3,000 mg by mouth daily.   . potassium gluconate 595 MG TABS tablet Take 1 tablet (595 mg total) by mouth every other day.  . rosuvastatin (CRESTOR) 20 MG tablet Take 1 tablet (20 mg total) by mouth daily.  . sertraline (ZOLOFT) 100 MG tablet Take 1 tablet (100 mg total) by mouth daily.  . Tiotropium Bromide Monohydrate (SPIRIVA RESPIMAT) 2.5 MCG/ACT AERS Inhale 2 puffs into the lungs daily.     Allergies:   Amoxicillin, Latex, and Penicillins   Social History   Socioeconomic History  . Marital status: Divorced    Spouse name: Not on file  . Number of children: 6  . Years of education: Not on file  . Highest education level: Not on file  Occupational History  . Occupation: disabled  Tobacco Use  . Smoking status: Current Every Day Smoker    Packs/day: 0.75     Years: 30.00    Pack years: 22.50    Types: Cigarettes  . Smokeless tobacco: Never Used  Substance and Sexual Activity  . Alcohol use: Yes    Alcohol/week: 7.0 standard drinks    Types: 7 Cans of beer per week  . Drug use: Yes    Types: Marijuana, Heroin, "Crack" cocaine, Cocaine    Comment: 05/09/2013 "<1 joint/day; last heroin & crack was before heart OR in 2007"  . Sexual activity: Yes    Birth control/protection: Condom, I.U.D.  Other Topics Concern  . Not on file  Social History Narrative  . Not on file   Social Determinants of Health   Financial Resource Strain:   . Difficulty of Paying Living Expenses:   Food Insecurity:   . Worried About Programme researcher, broadcasting/film/video in the Last Year:   .  Ran Out of Food in the Last Year:   Transportation Needs:   . Freight forwarder (Medical):   Marland Kitchen Lack of Transportation (Non-Medical):   Physical Activity:   . Days of Exercise per Week:   . Minutes of Exercise per Session:   Stress:   . Feeling of Stress :   Social Connections:   . Frequency of Communication with Friends and Family:   . Frequency of Social Gatherings with Friends and Family:   . Attends Religious Services:   . Active Member of Clubs or Organizations:   . Attends Banker Meetings:   Marland Kitchen Marital Status:      Family History:  The patient's   family history includes Alcohol abuse in her father; Arthritis in her father; Diabetes in her daughter; Drug abuse in her father; Heart disease in her maternal grandmother; Hyperlipidemia in her brother and mother; Mental illness in an other family member; Ovarian cancer in her cousin.   ROS:   Please see the history of present illness.    ROS All other systems reviewed and are negative.   PHYSICAL EXAM:   VS:  BP 140/90   Pulse 79   Ht  (1.651 m)   Wt 186 lb (84.4 kg)   SpO2 97%   BMI 30.95 kg/m   Physical Exam  GEN: Obese, in no acute distress  Neck: no JVD, carotid bruits, or masses Cardiac:RRR;2/6  systolic murmur LSB Respiratory:  clear to auscultation bilaterally, normal work of breathing GI: soft, nontender, nondistended, + BS Ext: without cyanosis, clubbing, or edema, Good distal pulses bilaterally Neuro:  Alert and Oriented x 3 Psych: euthymic mood, full affect  Wt Readings from Last 3 Encounters:  04/29/20 186 lb (84.4 kg)  11/23/19 189 lb (85.7 kg)  10/03/19 200 lb (90.7 kg)      Studies/Labs Reviewed:   EKG:  EKG is not ordered today.    Recent Labs: 09/24/2019: B Natriuretic Peptide 155.6 10/03/2019: BUN 16; Creatinine, Ser 1.10; Potassium 3.8; Sodium 133 11/23/2019: Hemoglobin 15.9; Platelets 226; TSH 0.892   Lipid Panel    Component Value Date/Time   CHOL 189 11/23/2019 1005   TRIG 105 11/23/2019 1005   HDL 42 11/23/2019 1005   CHOLHDL 4.5 (H) 11/23/2019 1005   CHOLHDL 3 01/16/2014 1440   VLDL 18.0 01/16/2014 1440   LDLCALC 128 (H) 11/23/2019 1005    Additional studies/ records that were reviewed today include:  2D echo 12/03/2019  IMPRESSIONS     1. Left ventricular ejection fraction, by visual estimation, is 60 to  65%. The left ventricle has normal function. There is no left ventricular  hypertrophy.   2. The left ventricle has no regional wall motion abnormalities.   3. Global right ventricle has normal systolic function.The right  ventricular size is mildly enlarged. No increase in right ventricular wall  thickness.   4. Left atrial size was normal.   5. Right atrial size was severely dilated.   6. The mitral valve is normal in structure. No evidence of mitral valve  regurgitation. No evidence of mitral stenosis.   7. S/P Tricuspid valve repair with hx of endocarditis. No obvious  vegetation noted. There is moderate to severe tricuspid regurgitation.   8. The aortic valve is normal in structure. Aortic valve regurgitation is  not visualized. No evidence of aortic valve sclerosis or stenosis.   9. The pulmonic valve was normal in structure.  Pulmonic valve  regurgitation is not visualized.  10. Normal pulmonary artery systolic pressure.  11. The inferior vena cava is dilated in size with >50% respiratory  variability, suggesting right atrial pressure of 8 mmHg.  12. S/P PFO closure with no evidence of flow across the interatrial septum  by colorflow doppler.   FINDINGS   Left Ventricle: Left ventricular ejection fraction, by visual estimation,  is 60 to 65%. The left ventricle has normal function. The left ventricle  has no regional wall motion abnormalities. There is no left ventricular  hypertrophy. Left ventricular  diastolic parameters were normal. Normal left atrial pressure.   Right Ventricle: The right ventricular size is mildly enlarged. No  increase in right ventricular wall thickness. Global RV systolic function  is has normal systolic function. The tricuspid regurgitant velocity is  2.24 m/s, and with an assumed right atrial   pressure of 8 mmHg, the estimated right ventricular systolic pressure is  normal at 28.0 mmHg.   Left Atrium: Left atrial size was normal in size.   Right Atrium: Right atrial size was severely dilated   Pericardium: There is no evidence of pericardial effusion.   Mitral Valve: The mitral valve is normal in structure. No evidence of  mitral valve regurgitation. No evidence of mitral valve stenosis by  observation.   Tricuspid Valve: The tricuspid valve is degenerative in appearance.  Tricuspid valve regurgitation is not demonstrated. S/P Tricuspid valve  repair with hx of endocarditis. No obvious vegetation noted. There is  moderate to severe tricuspid regurgitation.   Aortic Valve: The aortic valve is normal in structure. Aortic valve  regurgitation is not visualized. The aortic valve is structurally normal,  with no evidence of sclerosis or stenosis.   Pulmonic Valve: The pulmonic valve was normal in structure. Pulmonic valve  regurgitation is not visualized. Pulmonic  regurgitation is not visualized.   Aorta: The aortic root, ascending aorta and aortic arch are all  structurally normal, with no evidence of dilitation or obstruction.   Venous: The inferior vena cava is dilated in size with greater than 50%  respiratory variability, suggesting right atrial pressure of 8 mmHg.   IAS/Shunts: No atrial level shunt detected by color flow Doppler. There is  no evidence of a patent foramen ovale. No ventricular septal defect is  seen or detected. There is no evidence of an atrial septal defect.        ASSESSMENT:    1. Severe tricuspid regurgitation   2. Chronic obstructive pulmonary disease, unspecified COPD type (HCC)   3. Hyperlipidemia, unspecified hyperlipidemia type   4. Tobacco abuse   5. Elevated blood-pressure reading without diagnosis of hypertension      PLAN:  In order of problems listed above:  Tricuspid regurgitation with history of endocarditis in 2007 treated with debridement as well as PFO closure and treatment of tamponade.  Last 2D echo 12/03/2019 moderate to severe TR. No change in symptoms and overall stable. Repeat echo in Feb with f/u with Dr. Tenny Craw  COPD significant continues to smoke-smoking cessation advised.  Hyperlipidemia LDL 128 started on Crestor.  Due for repeat lipids-check today.  Elevated BP- has never had HTN and repeat BP 138/88. Eating high salt diet. Reduce sodium, give BP cuff to monitor.   Medication Adjustments/Labs and Tests Ordered: Current medicines are reviewed at length with the patient today.  Concerns regarding medicines are outlined above.  Medication changes, Labs and Tests ordered today are listed in the Patient Instructions below. Patient Instructions   Medication Instructions:  Your physician recommends  that you continue on your current medications as directed. Please refer to the Current Medication list given to you today.  *If you need a refill on your cardiac medications before your next  appointment, please call your pharmacy*   Lab Work: TODAY: LIPIDS  If you have labs (blood work) drawn today and your tests are completely normal, you Fitzgerald receive your results only by: Marland Kitchen MyChart Message (if you have MyChart) OR . A paper copy in the mail If you have any lab test that is abnormal or we need to change your treatment, we Fitzgerald call you to review the results.   Testing/Procedures: Your physician has requested that you have an echocardiogram in February 2022. Echocardiography is a painless test that uses sound waves to create images of your heart. It provides your doctor with information about the size and shape of your heart and how well your heart's chambers and valves are working. This procedure takes approximately one hour. There are no restrictions for this procedure.     Follow-Up: At [facility], you and your health needs are our priority.  As part of our continuing mission to provide you with exceptional heart care, we have created designated Provider Care Teams.  These Care Teams include your primary Cardiologist (physician) and Advanced Practice Providers (APPs -  Physician Assistants and Nurse Practitioners) who all work together to provide you with the care you need, when you need it.  We recommend signing up for the patient portal called "MyChart".  Sign up information is provided on this After Visit Summary.  MyChart is used to connect with patients for Virtual Visits (Telemedicine).  Patients are able to view lab/test results, encounter notes, upcoming appointments, etc.  Non-urgent messages can be sent to your provider as well.   To learn more about what you can do with MyChart, go to [email or web address].    Your next appointment:   8 month(s)  The format for your next appointment:   In Person  Provider:   You may see Dietrich Pates, MD or one of the following Advanced Practice Providers on your designated Care Team:    Tereso Newcomer, PA-C  Vin [place],  [place]    Other Instructions Two Gram Sodium Diet 2000 mg  What is Sodium? Sodium is a mineral found naturally in many foods. The most significant source of sodium in the diet is table salt, which is about 40% sodium.  Processed, convenience, and preserved foods also contain a large amount of sodium.  The body needs only 500 mg of sodium daily to function,  A normal diet provides more than enough sodium even if you do not use salt.  Why Limit Sodium? A build up of sodium in the body can cause thirst, increased blood pressure, shortness of breath, and water retention.  Decreasing sodium in the diet can reduce edema and risk of heart attack or stroke associated with high blood pressure.  Keep in mind that there are many other factors involved in these health problems.  Heredity, obesity, lack of exercise, cigarette smoking, stress and what you eat all play a role.  General Guidelines:  Do not add salt at the table or in cooking.  One teaspoon of salt contains over 2 grams of sodium.  Read food labels  Avoid processed and convenience foods  Ask your dietitian before eating any foods not dicussed in the menu planning guidelines  Consult your physician if you wish to use a salt substitute or a sodium containing  medication such as antacids.  Limit milk and milk products to 16 oz (2 cups) per day.  Shopping Hints:  READ LABELS!! "Dietetic" does not necessarily mean low sodium.  Salt and other sodium ingredients are often added to foods during processing.   Menu Planning Guidelines Food Group Choose More Often Avoid  Beverages (see also the milk group All fruit juices, low-sodium, salt-free vegetables juices, low-sodium carbonated beverages Regular vegetable or tomato juices, commercially softened water used for drinking or cooking  Breads and Cereals Enriched white, wheat, rye and pumpernickel bread, hard rolls and dinner rolls; muffins, cornbread and waffles; most dry cereals, cooked cereal  without added salt; unsalted crackers and breadsticks; low sodium or homemade bread crumbs Bread, rolls and crackers with salted tops; quick breads; instant hot cereals; pancakes; commercial bread stuffing; self-rising flower and biscuit mixes; regular bread crumbs or cracker crumbs  Desserts and Sweets Desserts and sweets mad with mild should be within allowance Instant pudding mixes and cake mixes  Fats Butter or margarine; vegetable oils; unsalted salad dressings, regular salad dressings limited to 1 Tbs; light, sour and heavy cream Regular salad dressings containing bacon fat, bacon bits, and salt pork; snack dips made with instant soup mixes or processed cheese; salted nuts  Fruits Most fresh, frozen and canned fruits Fruits processed with salt or sodium-containing ingredient (some dried fruits are processed with sodium sulfites        Vegetables Fresh, frozen vegetables and low- sodium canned vegetables Regular canned vegetables, sauerkraut, pickled vegetables, and others prepared in brine; frozen vegetables in sauces; vegetables seasoned with ham, bacon or salt pork  Condiments, Sauces, Miscellaneous  Salt substitute with physician's approval; pepper, herbs, spices; vinegar, lemon or lime juice; hot pepper sauce; garlic powder, onion powder, low sodium soy sauce (1 Tbs.); low sodium condiments (ketchup, chili sauce, mustard) in limited amounts (1 tsp.) fresh ground horseradish; unsalted tortilla chips, pretzels, potato chips, popcorn, salsa (1/4 cup) Any seasoning made with salt including garlic salt, celery salt, onion salt, and seasoned salt; sea salt, rock salt, kosher salt; meat tenderizers; monosodium glutamate; mustard, regular soy sauce, barbecue, sauce, chili sauce, teriyaki sauce, steak sauce, Worcestershire sauce, and most flavored vinegars; canned gravy and mixes; regular condiments; salted snack foods, olives, picles, relish, horseradish sauce, catsup   Food preparation: Try these  seasonings Meats:    Pork Sage, onion Serve with applesauce  Chicken Poultry seasoning, thyme, parsley Serve with cranberry sauce  Lamb Curry powder, rosemary, garlic, thyme Serve with mint sauce or jelly  Veal Marjoram, basil Serve with current jelly, cranberry sauce  Beef Pepper, bay leaf Serve with dry mustard, unsalted chive butter  Fish Bay leaf, dill Serve with unsalted lemon butter, unsalted parsley butter  Vegetables:    Asparagus Lemon juice   Broccoli Lemon juice   Carrots Mustard dressing parsley, mint, nutmeg, glazed with unsalted butter and sugar   Green beans Marjoram, lemon juice, nutmeg,dill seed   Tomatoes Basil, marjoram, onion   Spice /blend for [employer]" 4 tsp ground thyme 1 tsp ground sage 3 tsp ground rosemary 4 tsp ground marjoram   Test your knowledge 1. A product that says "Salt Free" may still contain sodium. True or False 2. Garlic Powder and Hot Pepper Sauce an be used as alternative seasonings.True or False 3. Processed foods have more sodium than fresh foods.  True or False 4. Canned Vegetables have less sodium than froze True or False  WAYS TO DECREASE YOUR SODIUM INTAKE 1. Avoid the  use of added salt in cooking and at the table.  Table salt (and other prepared seasonings which contain salt) is probably one of the greatest sources of sodium in the diet.  Unsalted foods can gain flavor from the sweet, sour, and butter taste sensations of herbs and spices.  Instead of using salt for seasoning, try the following seasonings with the foods listed.  Remember: how you use them to enhance natural food flavors is limited only by your creativity... Allspice-Meat, fish, eggs, fruit, peas, red and yellow vegetables Almond Extract-Fruit baked goods Anise Seed-Sweet breads, fruit, carrots, beets, cottage cheese, cookies (tastes like licorice) Basil-Meat, fish, eggs, vegetables, rice, vegetables salads, soups, sauces Bay Leaf-Meat, fish, stews, poultry Burnet-Salad,  vegetables (cucumber-like flavor) Caraway Seed-Bread, cookies, cottage cheese, meat, vegetables, cheese, rice Cardamon-Baked goods, fruit, soups Celery Powder or seed-Salads, salad dressings, sauces, meatloaf, soup, bread.Do not use  celery salt Chervil-Meats, salads, fish, eggs, vegetables, cottage cheese (parsley-like flavor) Chili Power-Meatloaf, chicken cheese, corn, eggplant, egg dishes Chives-Salads cottage cheese, egg dishes, soups, vegetables, sauces Cilantro-Salsa, casseroles Cinnamon-Baked goods, fruit, pork, lamb, chicken, carrots Cloves-Fruit, baked goods, fish, pot roast, green beans, beets, carrots Coriander-Pastry, cookies, meat, salads, cheese (lemon-orange flavor) Cumin-Meatloaf, fish,cheese, eggs, cabbage,fruit pie (caraway flavor) [employer]ersCurry Powder-Meat, fruit, eggs, fish, poultry, cottage cheese, vegetables Dill Seed-Meat, cottage cheese, poultry, vegetables, fish, salads, bread Fennel Seed-Bread, cookies, apples, pork, eggs, fish, beets, cabbage, cheese, Licorice-like flavor Garlic-(buds or powder) Salads, meat, poultry, fish, bread, butter, vegetables, potatoes.Do not  use garlic salt Ginger-Fruit, vegetables, baked goods, meat, fish, poultry Horseradish Root-Meet, vegetables, butter Lemon Juice or Extract-Vegetables, fruit, tea, baked goods, fish salads Mace-Baked goods fruit, vegetables, fish, poultry (taste like nutmeg) Maple Extract-Syrups Marjoram-Meat, chicken, fish, vegetables, breads, green salads (taste like Sage) Mint-Tea, lamb, sherbet, vegetables, desserts, carrots, cabbage Mustard, Dry or Seed-Cheese, eggs, meats, vegetables, poultry Nutmeg-Baked goods, fruit, chicken, eggs, vegetables, desserts Onion Powder-Meat, fish, poultry, vegetables, cheese, eggs, bread, rice salads (Do not use   Onion salt) Orange Extract-Desserts, baked goods Oregano-Pasta, eggs, cheese, onions, pork, lamb, fish, chicken, vegetables, green salads Paprika-Meat, fish, poultry, eggs,  cheese, vegetables Parsley Flakes-Butter, vegetables, meat fish, poultry, eggs, bread, salads (certain forms may   Contain sodium Pepper-Meat fish, poultry, vegetables, eggs Peppermint Extract-Desserts, baked goods Poppy Seed-Eggs, bread, cheese, fruit dressings, baked goods, noodles, vegetables, cottage  [employer] RxCheese Poultry Seasoning-Poultry,veal Rosemary-Lamb, poultry, meat, fish, cauliflower, turnips,eggs bread Saffron-Rice, bread, veal, chicken, fish, eggs Sage-Meat, fish, poultry, onions, eggplant, tomateos, pork, stews Savory-Eggs, salads, poultry, meat, rice, vegetables, soups, pork Tarragon-Meat, poultry, fish, eggs, butter, vegetables (licorice-like flavor)  Thyme-Meat, poultry, fish, eggs, vegetables, (clover-like flavor), sauces, soups Tumeric-Salads, butter, eggs, fish, rice, vegetables (saffron-like flavor) Vanilla Extract-Baked goods, candy Vinegar-Salads, vegetables, meat marinades Walnut Extract-baked goods, candy  2. Choose your Foods Wisely   The following is a list of foods to avoid which are high in sodium:  Meats-Avoid all smoked, canned, salt cured, dried and kosher meat and fish as well as Anchovies   Lox [employer] SemiconductorBacon    Luncheon meats:Bologna, Liverwurst, Pastrami Canned meat or fish  Marinated herring Caviar    Pepperoni Corned Beef   Pizza Dried chipped beef  Salami Frozen breaded fish or meat Salt pork Frankfurters or hot dogs  Sardines Gefilte fish   Sausage Ham (boiled ham, Proscuitto Smoked butt    spiced ham)   Spam      TV Dinners Vegetables Canned vegetables (Regular) Relish Canned mushrooms  Sauerkraut Olives    Tomato juice [employer]Pickles  Bakery and [employer] CompanyDessert Products  Canned puddings  Cream pies Cheesecake   Decorated cakes Cookies  Beverages/Juices Tomato juice, regular  Gatorade   V-8 vegetable juice, regular  Breads and Cereals Biscuit mixes   Salted potato chips, corn chips, pretzels Bread stuffing mixes  Salted crackers and rolls Pancake and  waffle mixes Self-rising flour  Seasonings Accent    Meat sauces Barbecue sauce  Meat tenderizer Catsup    Monosodium glutamate (MSG) Celery salt   Onion salt Chili sauce   Prepared mustard Garlic salt   Salt, seasoned salt, sea salt Gravy mixes   Soy sauce Horseradish   Steak sauce Ketchup   Tartar sauce Lite salt    Teriyaki sauce Marinade mixes   Worcestershire sauce  Others Baking powder   Cocoa and cocoa mixes Baking soda   Commercial casserole mixes Candy-caramels, chocolate  Dehydrated soups    Bars, fudge,nougats  Instant rice and pasta mixes Canned broth or soup  Maraschino cherries Cheese, aged and processed cheese and cheese spreads  Learning Assessment Quiz  Indicated T (for True) or F (for False) for each of the following statements:  1. _____ Fresh fruits and vegetables and unprocessed grains are generally low in sodium 2. _____ Water may contain a considerable amount of sodium, depending on the source 3. _____ You can always tell if a food is high in sodium by tasting it 4. _____ Certain laxatives my be high in sodium and should be avoided unless prescribed   by a physician or pharmacist 5. _____ Salt substitutes may be used freely by anyone on a sodium restricted diet 6. _____ Sodium is present in table salt, food additives and as a natural component of   most foods 7. _____ Table salt is approximately 90% sodium 8. _____ Limiting sodium intake may help prevent excess fluid accumulation in the body 9. _____ On a sodium-restricted diet, seasonings such as bouillon soy sauce, and    cooking wine should be used in place of table salt 10. _____ On an ingredient list, a product which lists monosodium glutamate as the first   ingredient is an appropriate food to include on a low sodium diet  Circle the best answer(s) to the following statements (Hint: there may be more than one correct answer)  11. On a low-sodium diet, some acceptable snack items are:    A.  Olives  F. Bean dip   K. Grapefruit juice    B. Salted Pretzels G. Commercial Popcorn   L. Canned peaches    C. Carrot Sticks  H. Bouillon   M. Unsalted nuts   D. [place] fries  I. Peanut butter crackers N. Salami   E. Sweet pickles J. Tomato Juice   O. Pizza  12.  Seasonings that may be used freely on a reduced - sodium diet include   A. Lemon wedges F.Monosodium glutamate K. Celery seed    B.Soysauce   G. Pepper   L. Mustard powder   C. Sea salt  H. Cooking wine  M. Onion flakes   D. Vinegar  E. Prepared horseradish N. Salsa   E. Sage   J. Worcestershire sauce  O. Chutney  Steps to Quit Smoking Smoking tobacco is the leading cause of preventable death. It can affect almost every organ in the body. Smoking puts you and people around you at risk for many serious, long-lasting (chronic) diseases. Quitting smoking can be hard, but it is one of the best things that you can do for your health. It is never  too late to quit. How do I get ready to quit? When you decide to quit smoking, make a plan to help you succeed. Before you quit:  Pick a date to quit. Set a date within the next 2 weeks to give you time to prepare.  Write down the reasons why you are quitting. Keep this list in places where you Fitzgerald see it often.  Tell your family, friends, and co-workers that you are quitting. Their support is important.  Talk with your doctor about the choices that may help you quit.  Find out if your health insurance Fitzgerald pay for these treatments.  Know the people, places, things, and activities that make you want to smoke (triggers). Avoid them. What first steps can I take to quit smoking?  Throw away all cigarettes at home, at work, and in your car.  Throw away the things that you use when you smoke, such as ashtrays and lighters.  Clean your car. Make sure to empty the ashtray.  Clean your home, including curtains and carpets. What can I do to help me quit smoking? Talk with your  doctor about taking medicines and seeing a counselor at the same time. You are more likely to succeed when you do both.  If you are pregnant or breastfeeding, talk with your doctor about counseling or other ways to quit smoking. Do not take medicine to help you quit smoking unless your doctor tells you to do so. To quit smoking: Quit right away  Quit smoking totally, instead of slowly cutting back on how much you smoke over a period of time.  Go to counseling. You are more likely to quit if you go to counseling sessions regularly. Take medicine You may take medicines to help you quit. Some medicines need a prescription, and some you can buy over-the-counter. Some medicines may contain a drug called nicotine to replace the nicotine in cigarettes. Medicines may:  Help you to stop having the desire to smoke (cravings).  Help to stop the problems that come when you stop smoking (withdrawal symptoms). Your doctor may ask you to use:  Nicotine patches, gum, or lozenges.  Nicotine inhalers or sprays.  Non-nicotine medicine that is taken by mouth. Find resources Find resources and other ways to help you quit smoking and remain smoke-free after you quit. These resources are most helpful when you use them often. They include:  Online chats with a Veterinary surgeon.  Phone quitlines.  Printed Materials engineer.  Support groups or group counseling.  Text messaging programs.  Mobile phone apps. Use apps on your mobile phone or tablet that can help you stick to your quit plan. There are many free apps for mobile phones and tablets as well as websites. Examples include Quit Guide from the [employer] and smokefree.gov  What things can I do to make it easier to quit?   Talk to your family and friends. Ask them to support and encourage you.  Call a phone quitline (1-800-QUIT-NOW), reach out to support groups, or work with a Veterinary surgeon.  Ask people who smoke to not smoke around you.  Avoid places that  make you want to smoke, such as: ? Bars. ? Parties. ? Smoke-break areas at work.  Spend time with people who do not smoke.  Lower the stress in your life. Stress can make you want to smoke. Try these things to help your stress: ? Getting regular exercise. ? Doing deep-breathing exercises. ? Doing yoga. ? Meditating. ? Doing a body scan.  To do this, close your eyes, focus on one area of your body at a time from head to toe. Notice which parts of your body are tense. Try to relax the muscles in those areas. How Fitzgerald I feel when I quit smoking? Day 1 to 3 weeks Within the first 24 hours, you may start to have some problems that come from quitting tobacco. These problems are very bad 2-3 days after you quit, but they do not often last for more than 2-3 weeks. You may get these symptoms:  Mood swings.  Feeling restless, nervous, angry, or annoyed.  Trouble concentrating.  Dizziness.  Strong desire for high-sugar foods and nicotine.  Weight gain.  Trouble pooping (constipation).  Feeling like you may vomit (nausea).  Coughing or a sore throat.  Changes in how the medicines that you take for other issues work in your body.  Depression.  Trouble sleeping (insomnia). Week 3 and afterward After the first 2-3 weeks of quitting, you may start to notice more positive results, such as:  Better sense of smell and taste.  Less coughing and sore throat.  Slower heart rate.  Lower blood pressure.  Clearer skin.  Better breathing.  Fewer sick days. Quitting smoking can be hard. Do not give up if you fail the first time. Some people need to try a few times before they succeed. Do your best to stick to your quit plan, and talk with your doctor if you have any questions or concerns. Summary  Smoking tobacco is the leading cause of preventable death. Quitting smoking can be hard, but it is one of the best things that you can do for your health.  When you decide to quit smoking,  make a plan to help you succeed.  Quit smoking right away, not slowly over a period of time.  When you start quitting, seek help from your doctor, family, or friends. This information is not intended to replace advice given to you by your health care provider. Make sure you discuss any questions you have with your health care provider. Document Revised: 07/13/2019 Document Reviewed: 01/06/2019 Elsevier Patient Education  Metter, Ermalinda Barrios, [place]  04/29/2020 2:04 PM    [facility] [address], [place], [place]  [address] Phone: [phone]; Fax: [phone]

## 2020-04-29 ENCOUNTER — Ambulatory Visit (INDEPENDENT_AMBULATORY_CARE_PROVIDER_SITE_OTHER): Payer: Medicare Other | Admitting: Physician Assistant

## 2020-04-29 ENCOUNTER — Other Ambulatory Visit: Payer: Self-pay

## 2020-04-29 ENCOUNTER — Encounter: Payer: Self-pay | Admitting: Physician Assistant

## 2020-04-29 VITALS — BP 140/90 | HR 79 | Ht 65.0 in | Wt 186.0 lb

## 2020-04-29 DIAGNOSIS — Z720 Tobacco use: Secondary | ICD-10-CM | POA: Diagnosis not present

## 2020-04-29 DIAGNOSIS — E785 Hyperlipidemia, unspecified: Secondary | ICD-10-CM

## 2020-04-29 DIAGNOSIS — J449 Chronic obstructive pulmonary disease, unspecified: Secondary | ICD-10-CM | POA: Diagnosis not present

## 2020-04-29 DIAGNOSIS — I071 Rheumatic tricuspid insufficiency: Secondary | ICD-10-CM

## 2020-04-29 DIAGNOSIS — R030 Elevated blood-pressure reading, without diagnosis of hypertension: Secondary | ICD-10-CM

## 2020-04-29 NOTE — Patient Instructions (Signed)
Medication Instructions:  Your physician recommends that you continue on your current medications as directed. Please refer to the Current Medication list given to you today.  *If you need a refill on your cardiac medications before your next appointment, please call your pharmacy*   Lab Work: TODAY: LIPIDS  If you have labs (blood work) drawn today and your tests are completely normal, you will receive your results only by: Marland Kitchen. MyChart Message (if you have MyChart) OR . A paper copy in the mail If you have any lab test that is abnormal or we need to change your treatment, we will call you to review the results.   Testing/Procedures: Your physician has requested that you have an echocardiogram in February 2022. Echocardiography is a painless test that uses sound waves to create images of your heart. It provides your doctor with information about the size and shape of your heart and how well your heart's chambers and valves are working. This procedure takes approximately one hour. There are no restrictions for this procedure.     Follow-Up: At [facility], you and your health needs are our priority.  As part of our continuing mission to provide you with exceptional heart care, we have created designated Provider Care Teams.  These Care Teams include your primary Cardiologist (physician) and Advanced Practice Providers (APPs -  Physician Assistants and Nurse Practitioners) who all work together to provide you with the care you need, when you need it.  We recommend signing up for the patient portal called "MyChart".  Sign up information is provided on this After Visit Summary.  MyChart is used to connect with patients for Virtual Visits (Telemedicine).  Patients are able to view lab/test results, encounter notes, upcoming appointments, etc.  Non-urgent messages can be sent to your provider as well.   To learn more about what you can do with MyChart, go to [email or web address].    Your  next appointment:   8 month(s)  The format for your next appointment:   In Person  Provider:   You may see Dietrich PatesPaula Ross, MD or one of the following Advanced Practice Providers on your designated Care Team:    Tereso NewcomerScott Weaver, PA-C  Vin PerkasieBhagat, [place]-C    Other Instructions Two Gram Sodium Diet 2000 mg  What is Sodium? Sodium is a mineral found naturally in many foods. The most significant source of sodium in the diet is table salt, which is about 40% sodium.  Processed, convenience, and preserved foods also contain a large amount of sodium.  The body needs only 500 mg of sodium daily to function,  A normal diet provides more than enough sodium even if you do not use salt.  Why Limit Sodium? A build up of sodium in the body can cause thirst, increased blood pressure, shortness of breath, and water retention.  Decreasing sodium in the diet can reduce edema and risk of heart attack or stroke associated with high blood pressure.  Keep in mind that there are many other factors involved in these health problems.  Heredity, obesity, lack of exercise, cigarette smoking, stress and what you eat all play a role.  General Guidelines:  Do not add salt at the table or in cooking.  One teaspoon of salt contains over 2 grams of sodium.  Read food labels  Avoid processed and convenience foods  Ask your dietitian before eating any foods not dicussed in the menu planning guidelines  Consult your physician if you wish to use a salt  substitute or a sodium containing medication such as antacids.  Limit milk and milk products to 16 oz (2 cups) per day.  Shopping Hints:  READ LABELS!! "Dietetic" does not necessarily mean low sodium.  Salt and other sodium ingredients are often added to foods during processing.   Menu Planning Guidelines Food Group Choose More Often Avoid  Beverages (see also the milk group All fruit juices, low-sodium, salt-free vegetables juices, low-sodium carbonated beverages Regular  vegetable or tomato juices, commercially softened water used for drinking or cooking  Breads and Cereals Enriched white, wheat, rye and pumpernickel bread, hard rolls and dinner rolls; muffins, cornbread and waffles; most dry cereals, cooked cereal without added salt; unsalted crackers and breadsticks; low sodium or homemade bread crumbs Bread, rolls and crackers with salted tops; quick breads; instant hot cereals; pancakes; commercial bread stuffing; self-rising flower and biscuit mixes; regular bread crumbs or cracker crumbs  Desserts and Sweets Desserts and sweets mad with mild should be within allowance Instant pudding mixes and cake mixes  Fats Butter or margarine; vegetable oils; unsalted salad dressings, regular salad dressings limited to 1 Tbs; light, sour and heavy cream Regular salad dressings containing bacon fat, bacon bits, and salt pork; snack dips made with instant soup mixes or processed cheese; salted nuts  Fruits Most fresh, frozen and canned fruits Fruits processed with salt or sodium-containing ingredient (some dried fruits are processed with sodium sulfites        Vegetables Fresh, frozen vegetables and low- sodium canned vegetables Regular canned vegetables, sauerkraut, pickled vegetables, and others prepared in brine; frozen vegetables in sauces; vegetables seasoned with ham, bacon or salt pork  Condiments, Sauces, Miscellaneous  Salt substitute with physician's approval; pepper, herbs, spices; vinegar, lemon or lime juice; hot pepper sauce; garlic powder, onion powder, low sodium soy sauce (1 Tbs.); low sodium condiments (ketchup, chili sauce, mustard) in limited amounts (1 tsp.) fresh ground horseradish; unsalted tortilla chips, pretzels, potato chips, popcorn, salsa (1/4 cup) Any seasoning made with salt including garlic salt, celery salt, onion salt, and seasoned salt; sea salt, rock salt, kosher salt; meat tenderizers; monosodium glutamate; mustard, regular soy sauce,  barbecue, sauce, chili sauce, teriyaki sauce, steak sauce, Worcestershire sauce, and most flavored vinegars; canned gravy and mixes; regular condiments; salted snack foods, olives, picles, relish, horseradish sauce, catsup   Food preparation: Try these seasonings Meats:    Pork Sage, onion Serve with applesauce  Chicken Poultry seasoning, thyme, parsley Serve with cranberry sauce  Lamb Curry powder, rosemary, garlic, thyme Serve with mint sauce or jelly  Veal Marjoram, basil Serve with current jelly, cranberry sauce  Beef Pepper, bay leaf Serve with dry mustard, unsalted chive butter  Fish [place] leaf, dill Serve with unsalted lemon butter, unsalted parsley butter  Vegetables:    Asparagus Lemon juice   Broccoli Lemon juice   Carrots Mustard dressing parsley, mint, nutmeg, glazed with unsalted butter and sugar   Green beans Marjoram, lemon juice, nutmeg,dill seed   Tomatoes Basil, marjoram, onion   Spice /blend for [employer]" 4 tsp ground thyme 1 tsp ground sage 3 tsp ground rosemary 4 tsp ground marjoram   Test your knowledge 1. A product that says "Salt Free" may still contain sodium. True or False 2. Garlic Powder and Hot Pepper Sauce an be used as alternative seasonings.True or False 3. Processed foods have more sodium than fresh foods.  True or False 4. Canned Vegetables have less sodium than froze True or False  WAYS TO DECREASE YOUR  SODIUM INTAKE 1. Avoid the use of added salt in cooking and at the table.  Table salt (and other prepared seasonings which contain salt) is probably one of the greatest sources of sodium in the diet.  Unsalted foods can gain flavor from the sweet, sour, and butter taste sensations of herbs and spices.  Instead of using salt for seasoning, try the following seasonings with the foods listed.  Remember: how you use them to enhance natural food flavors is limited only by your creativity... Allspice-Meat, fish, eggs, fruit, peas, red and yellow  vegetables Almond Extract-Fruit baked goods Anise Seed-Sweet breads, fruit, carrots, beets, cottage cheese, cookies (tastes like licorice) Basil-Meat, fish, eggs, vegetables, rice, vegetables salads, soups, sauces Bay Leaf-Meat, fish, stews, poultry Burnet-Salad, vegetables (cucumber-like flavor) Caraway Seed-Bread, cookies, cottage cheese, meat, vegetables, cheese, rice Cardamon-Baked goods, fruit, soups Celery Powder or seed-Salads, salad dressings, sauces, meatloaf, soup, bread.Do not use  celery salt Chervil-Meats, salads, fish, eggs, vegetables, cottage cheese (parsley-like flavor) Chili Power-Meatloaf, chicken cheese, corn, eggplant, egg dishes Chives-Salads cottage cheese, egg dishes, soups, vegetables, sauces Cilantro-Salsa, casseroles Cinnamon-Baked goods, fruit, pork, lamb, chicken, carrots Cloves-Fruit, baked goods, fish, pot roast, green beans, beets, carrots Coriander-Pastry, cookies, meat, salads, cheese (lemon-orange flavor) Cumin-Meatloaf, fish,cheese, eggs, cabbage,fruit pie (caraway flavor) [employer], fruit, eggs, fish, poultry, cottage cheese, vegetables Dill Seed-Meat, cottage cheese, poultry, vegetables, fish, salads, bread Fennel Seed-Bread, cookies, apples, pork, eggs, fish, beets, cabbage, cheese, Licorice-like flavor Garlic-(buds or powder) Salads, meat, poultry, fish, bread, butter, vegetables, potatoes.Do not  use garlic salt Ginger-Fruit, vegetables, baked goods, meat, fish, poultry Horseradish Root-Meet, vegetables, butter Lemon Juice or Extract-Vegetables, fruit, tea, baked goods, fish salads Mace-Baked goods fruit, vegetables, fish, poultry (taste like nutmeg) Maple Extract-Syrups Marjoram-Meat, chicken, fish, vegetables, breads, green salads (taste like Sage) Mint-Tea, lamb, sherbet, vegetables, desserts, carrots, cabbage Mustard, Dry or Seed-Cheese, eggs, meats, vegetables, poultry Nutmeg-Baked goods, fruit, chicken, eggs, vegetables,  desserts Onion Powder-Meat, fish, poultry, vegetables, cheese, eggs, bread, rice salads (Do not use   Onion salt) Orange Extract-Desserts, baked goods Oregano-Pasta, eggs, cheese, onions, pork, lamb, fish, chicken, vegetables, green salads Paprika-Meat, fish, poultry, eggs, cheese, vegetables Parsley Flakes-Butter, vegetables, meat fish, poultry, eggs, bread, salads (certain forms may   Contain sodium Pepper-Meat fish, poultry, vegetables, eggs Peppermint Extract-Desserts, baked goods Poppy Seed-Eggs, bread, cheese, fruit dressings, baked goods, noodles, vegetables, cottage  [employer], poultry, meat, fish, cauliflower, turnips,eggs bread Saffron-Rice, bread, veal, chicken, fish, eggs Sage-Meat, fish, poultry, onions, eggplant, tomateos, pork, stews Savory-Eggs, salads, poultry, meat, rice, vegetables, soups, pork Tarragon-Meat, poultry, fish, eggs, butter, vegetables (licorice-like flavor)  Thyme-Meat, poultry, fish, eggs, vegetables, (clover-like flavor), sauces, soups Tumeric-Salads, butter, eggs, fish, rice, vegetables (saffron-like flavor) Vanilla Extract-Baked goods, candy Vinegar-Salads, vegetables, meat marinades Walnut Extract-baked goods, candy  2. Choose your Foods Wisely   The following is a list of foods to avoid which are high in sodium:  Meats-Avoid all smoked, canned, salt cured, dried and kosher meat and fish as well as Anchovies   [employer] meats:Bologna, Liverwurst, Pastrami Canned meat or fish  Marinated herring Caviar    Pepperoni Corned Beef   Pizza Dried chipped beef  Salami Frozen breaded fish or meat Salt pork Frankfurters or hot dogs  Sardines Gefilte fish   Sausage Ham (boiled ham, Proscuitto Smoked butt    spiced ham)   Spam      TV Dinners Vegetables Canned vegetables (Regular) Relish Canned mushrooms  Sauerkraut Olives    Tomato juice Rosita Fire  Bakery and Higher education careers adviser  puddings  Cream pies Cheesecake   Decorated cakes Cookies  Beverages/Juices Tomato juice, regular  Gatorade   V-8 vegetable juice, regular  Breads and Cereals Biscuit mixes   Salted potato chips, corn chips, pretzels Bread stuffing mixes  Salted crackers and rolls Pancake and waffle mixes Self-rising flour  Seasonings Accent    Meat sauces Barbecue sauce  Meat tenderizer Catsup    Monosodium glutamate (MSG) Celery salt   Onion salt Chili sauce   Prepared mustard Garlic salt   Salt, seasoned salt, sea salt Gravy mixes   Soy sauce Horseradish   Steak sauce Ketchup   Tartar sauce Lite salt    Teriyaki sauce Marinade mixes   Worcestershire sauce  Others Baking powder   Cocoa and cocoa mixes Baking soda   Commercial casserole mixes Candy-caramels, chocolate  Dehydrated soups    Bars, fudge,nougats  Instant rice and pasta mixes Canned broth or soup  Maraschino cherries Cheese, aged and processed cheese and cheese spreads  Learning Assessment Quiz  Indicated T (for True) or F (for False) for each of the following statements:  1. _____ Fresh fruits and vegetables and unprocessed grains are generally low in sodium 2. _____ Water may contain a considerable amount of sodium, depending on the source 3. _____ You can always tell if a food is high in sodium by tasting it 4. _____ Certain laxatives my be high in sodium and should be avoided unless prescribed   by a physician or pharmacist 5. _____ Salt substitutes may be used freely by anyone on a sodium restricted diet 6. _____ Sodium is present in table salt, food additives and as a natural component of   most foods 7. _____ Table salt is approximately 90% sodium 8. _____ Limiting sodium intake may help prevent excess fluid accumulation in the body 9. _____ On a sodium-restricted diet, seasonings such as bouillon soy sauce, and    cooking wine should be used in place of table salt 10. _____ On an ingredient list, a product which  lists monosodium glutamate as the first   ingredient is an appropriate food to include on a low sodium diet  Circle the best answer(s) to the following statements (Hint: there may be more than one correct answer)  11. On a low-sodium diet, some acceptable snack items are:    A. Olives  F. Bean dip   K. Grapefruit juice    B. Salted Pretzels G. Commercial Popcorn   L. Canned peaches    C. Carrot Sticks  H. Bouillon   M. Unsalted nuts   D. [place] fries  I. Peanut butter crackers N. Salami   E. Sweet pickles J. Tomato Juice   O. Pizza  12.  Seasonings that may be used freely on a reduced - sodium diet include   A. Lemon wedges F.Monosodium glutamate K. Celery seed    B.Soysauce   G. Pepper   L. Mustard powder   C. Sea salt  H. Cooking wine  M. Onion flakes   D. Vinegar  E. Prepared horseradish N. Salsa   E. Sage   J. Worcestershire sauce  O. Chutney  Steps to Quit Smoking Smoking tobacco is the leading cause of preventable death. It can affect almost every organ in the body. Smoking puts you and people around you at risk for many serious, long-lasting (chronic) diseases. Quitting smoking can be hard, but it is one of the best things that you can do for your  health. It is never too late to quit. How do I get ready to quit? When you decide to quit smoking, make a plan to help you succeed. Before you quit:  Pick a date to quit. Set a date within the next 2 weeks to give you time to prepare.  Write down the reasons why you are quitting. Keep this list in places where you will see it often.  Tell your family, friends, and co-workers that you are quitting. Their support is important.  Talk with your doctor about the choices that may help you quit.  Find out if your health insurance will pay for these treatments.  Know the people, places, things, and activities that make you want to smoke (triggers). Avoid them. What first steps can I take to quit smoking?  Throw away all  cigarettes at home, at work, and in your car.  Throw away the things that you use when you smoke, such as ashtrays and lighters.  Clean your car. Make sure to empty the ashtray.  Clean your home, including curtains and carpets. What can I do to help me quit smoking? Talk with your doctor about taking medicines and seeing a counselor at the same time. You are more likely to succeed when you do both.  If you are pregnant or breastfeeding, talk with your doctor about counseling or other ways to quit smoking. Do not take medicine to help you quit smoking unless your doctor tells you to do so. To quit smoking: Quit right away  Quit smoking totally, instead of slowly cutting back on how much you smoke over a period of time.  Go to counseling. You are more likely to quit if you go to counseling sessions regularly. Take medicine You may take medicines to help you quit. Some medicines need a prescription, and some you can buy over-the-counter. Some medicines may contain a drug called nicotine to replace the nicotine in cigarettes. Medicines may:  Help you to stop having the desire to smoke (cravings).  Help to stop the problems that come when you stop smoking (withdrawal symptoms). Your doctor may ask you to use:  Nicotine patches, gum, or lozenges.  Nicotine inhalers or sprays.  Non-nicotine medicine that is taken by mouth. Find resources Find resources and other ways to help you quit smoking and remain smoke-free after you quit. These resources are most helpful when you use them often. They include:  Online chats with a Veterinary surgeon.  Phone quitlines.  Printed Materials engineer.  Support groups or group counseling.  Text messaging programs.  Mobile phone apps. Use apps on your mobile phone or tablet that can help you stick to your quit plan. There are many free apps for mobile phones and tablets as well as websites. Examples include Quit Guide from the [employer] and smokefree.gov  What  things can I do to make it easier to quit?   Talk to your family and friends. Ask them to support and encourage you.  Call a phone quitline (1-800-QUIT-NOW), reach out to support groups, or work with a Veterinary surgeon.  Ask people who smoke to not smoke around you.  Avoid places that make you want to smoke, such as: ? Bars. ? Parties. ? Smoke-break areas at work.  Spend time with people who do not smoke.  Lower the stress in your life. Stress can make you want to smoke. Try these things to help your stress: ? Getting regular exercise. ? Doing deep-breathing exercises. ? Doing yoga. ? Meditating. ?  Doing a body scan. To do this, close your eyes, focus on one area of your body at a time from head to toe. Notice which parts of your body are tense. Try to relax the muscles in those areas. How will I feel when I quit smoking? Day 1 to 3 weeks Within the first 24 hours, you may start to have some problems that come from quitting tobacco. These problems are very bad 2-3 days after you quit, but they do not often last for more than 2-3 weeks. You may get these symptoms:  Mood swings.  Feeling restless, nervous, angry, or annoyed.  Trouble concentrating.  Dizziness.  Strong desire for high-sugar foods and nicotine.  Weight gain.  Trouble pooping (constipation).  Feeling like you may vomit (nausea).  Coughing or a sore throat.  Changes in how the medicines that you take for other issues work in your body.  Depression.  Trouble sleeping (insomnia). Week 3 and afterward After the first 2-3 weeks of quitting, you may start to notice more positive results, such as:  Better sense of smell and taste.  Less coughing and sore throat.  Slower heart rate.  Lower blood pressure.  Clearer skin.  Better breathing.  Fewer sick days. Quitting smoking can be hard. Do not give up if you fail the first time. Some people need to try a few times before they succeed. Do your best to stick  to your quit plan, and talk with your doctor if you have any questions or concerns. Summary  Smoking tobacco is the leading cause of preventable death. Quitting smoking can be hard, but it is one of the best things that you can do for your health.  When you decide to quit smoking, make a plan to help you succeed.  Quit smoking right away, not slowly over a period of time.  When you start quitting, seek help from your doctor, family, or friends. This information is not intended to replace advice given to you by your health care provider. Make sure you discuss any questions you have with your health care provider. Document Revised: 07/13/2019 Document Reviewed: 01/06/2019 Elsevier Patient Education  2020 [employer].

## 2020-04-30 LAB — LIPID PANEL
Chol/HDL Ratio: 2.4 ratio (ref 0.0–4.4)
Cholesterol, Total: 116 mg/dL (ref 100–199)
HDL: 49 mg/dL (ref >39)
LDL Chol Calc (NIH): 52 mg/dL (ref 0–99)
Triglycerides: 75 mg/dL (ref 0–149)
VLDL Cholesterol Cal: 15 mg/dL (ref 5–40)

## 2020-09-15 ENCOUNTER — Other Ambulatory Visit: Payer: Self-pay | Admitting: Internal Medicine

## 2020-09-15 DIAGNOSIS — Z1231 Encounter for screening mammogram for malignant neoplasm of breast: Secondary | ICD-10-CM

## 2020-10-10 ENCOUNTER — Other Ambulatory Visit: Payer: Medicare Other | Admitting: *Deleted

## 2020-10-10 ENCOUNTER — Other Ambulatory Visit: Payer: Self-pay

## 2020-10-10 DIAGNOSIS — I071 Rheumatic tricuspid insufficiency: Secondary | ICD-10-CM

## 2020-10-10 DIAGNOSIS — Z0000 Encounter for general adult medical examination without abnormal findings: Secondary | ICD-10-CM

## 2020-10-10 DIAGNOSIS — Z79899 Other long term (current) drug therapy: Secondary | ICD-10-CM

## 2020-10-11 LAB — LIPID PANEL
Chol/HDL Ratio: 2.9 ratio (ref 0.0–4.4)
Cholesterol, Total: 127 mg/dL (ref 100–199)
HDL: 44 mg/dL (ref >39)
LDL Chol Calc (NIH): 60 mg/dL (ref 0–99)
Triglycerides: 127 mg/dL (ref 0–149)
VLDL Cholesterol Cal: 23 mg/dL (ref 5–40)

## 2020-10-23 ENCOUNTER — Other Ambulatory Visit: Payer: Self-pay

## 2020-10-23 ENCOUNTER — Ambulatory Visit
Admission: RE | Admit: 2020-10-23 | Discharge: 2020-10-23 | Disposition: A | Discharge status: Home / Self Care | Payer: Medicare Other | Source: Ambulatory Visit | Attending: Internal Medicine | Admitting: Internal Medicine

## 2020-10-23 DIAGNOSIS — Z1231 Encounter for screening mammogram for malignant neoplasm of breast: Secondary | ICD-10-CM

## 2020-10-28 LAB — HM MAMMOGRAPHY

## 2020-11-03 IMAGING — CT CT ANGIO CHEST
2 of 7 series · 18 of 46 positions shown · IV contrast (APPLIED)
Comparison: None.

CLINICAL DATA: Right arm pain, cough, congestion, shortness of
breath

EXAM:
CT ANGIOGRAPHY CHEST WITH CONTRAST
TECHNIQUE: Multidetector CT imaging of the chest was performed using the
standard protocol during bolus administration of intravenous
contrast. Multiplanar CT image reconstructions and MIPs were
obtained to evaluate the vascular anatomy.
CONTRAST:  100mL OMNIPAQUE IOHEXOL 350 MG/ML SOLN

[Series 7: thins · axial · 0.75mm/px · z∈[-331,-103]mm · 15 of 368 slices shown]
[im 21/368  lung]
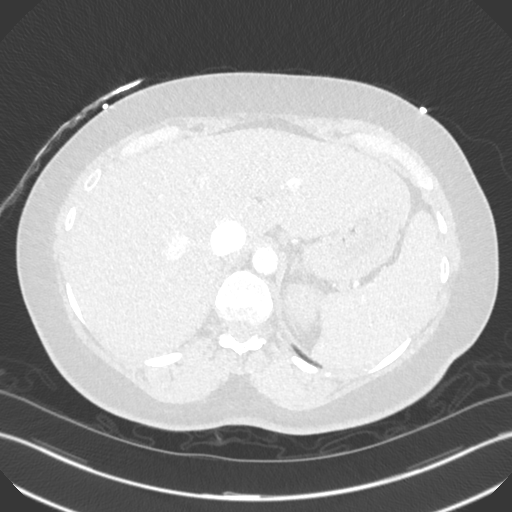
[im 41/368  soft-tissue]
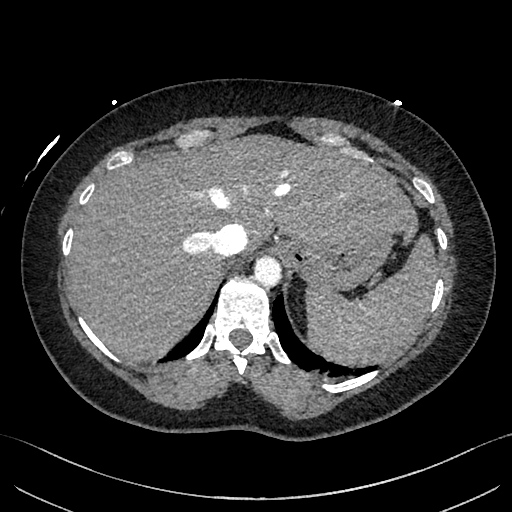
[im 62/368  lung]
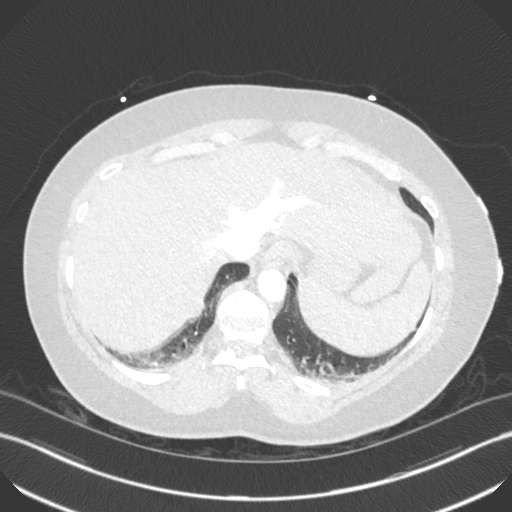
[im 82/368  soft-tissue]
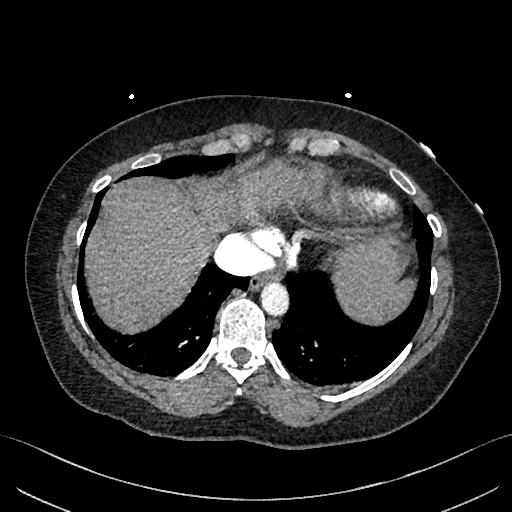
[im 123/368  lung]
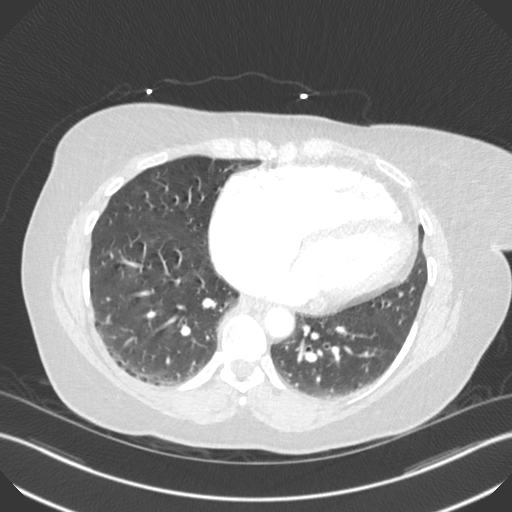
[im 143/368  soft-tissue]
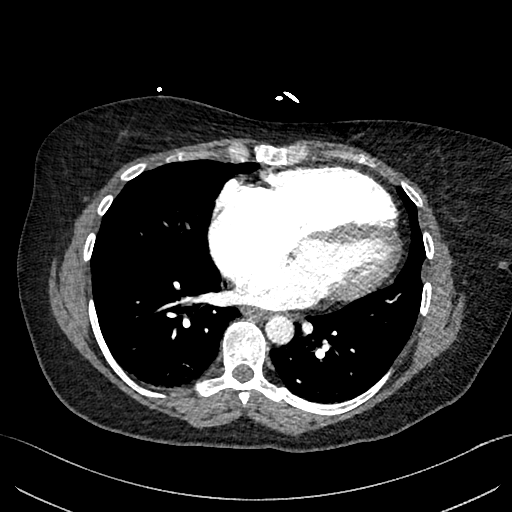
[im 164/368  lung]
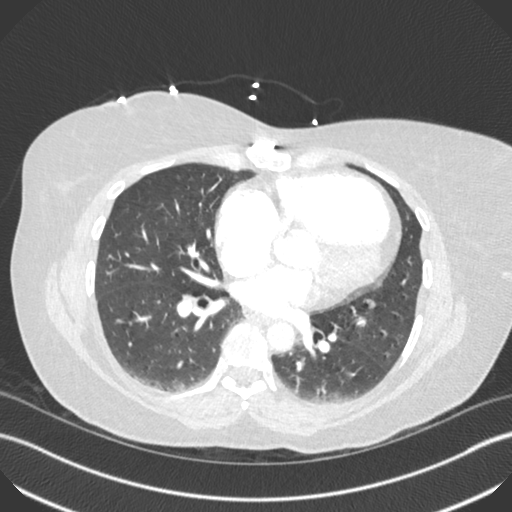
[im 184/368  soft-tissue]
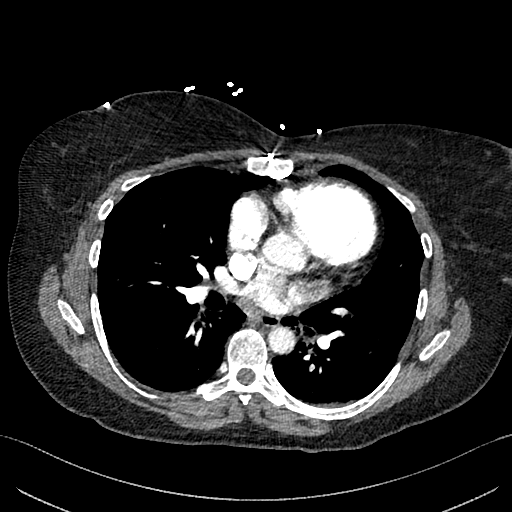
[im 204/368  lung]
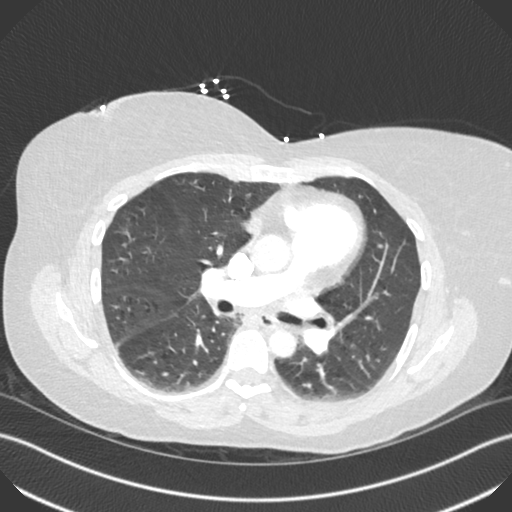
[im 225/368  soft-tissue]
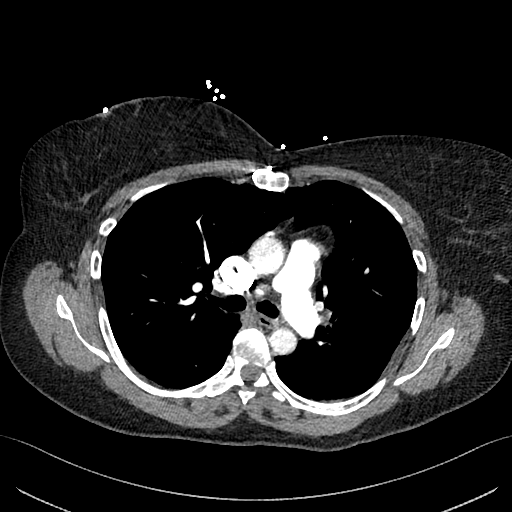
[im 245/368  lung]
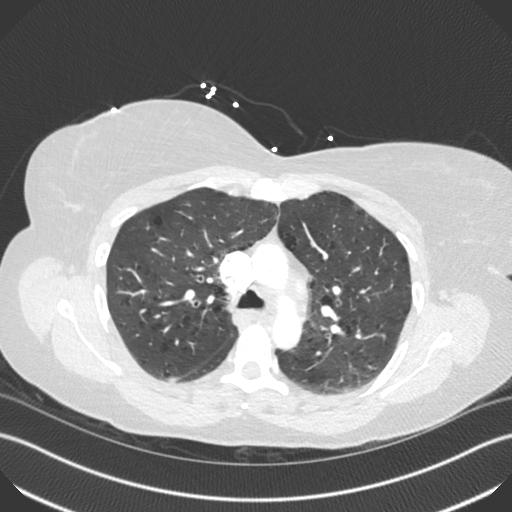
[im 286/368  soft-tissue]
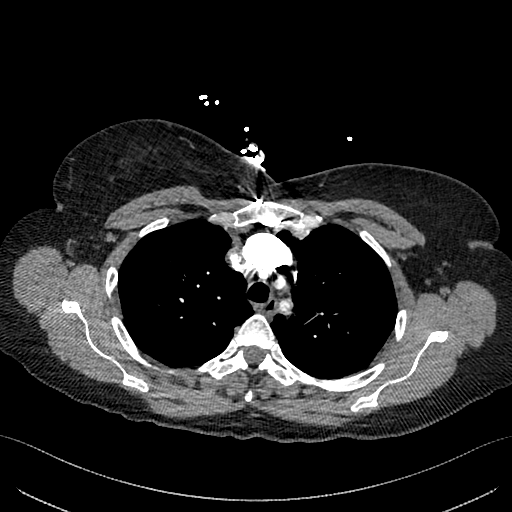
[im 306/368  lung]
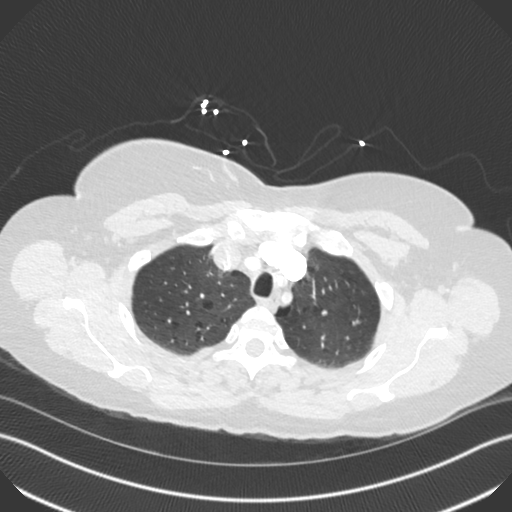
[im 327/368  soft-tissue]
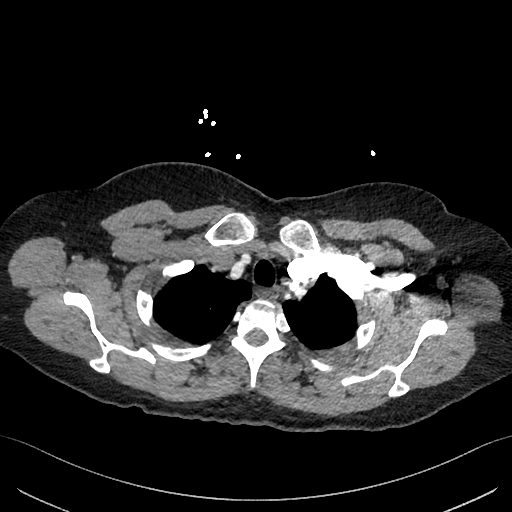
[im 347/368  lung]
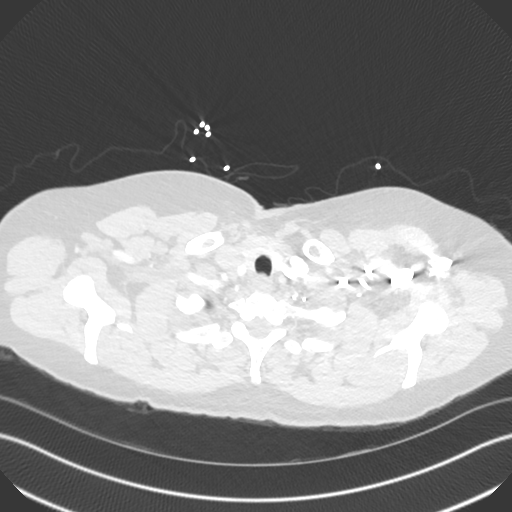

[Series 8: cor · coronal · 0.54mm/px · 3 of 144 slices shown]
[im 36/144  soft-tissue]
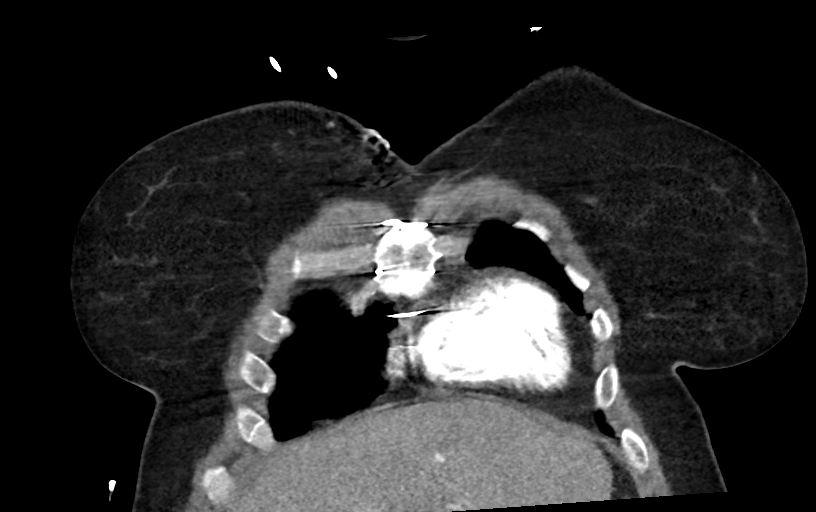
[im 72/144  soft-tissue]
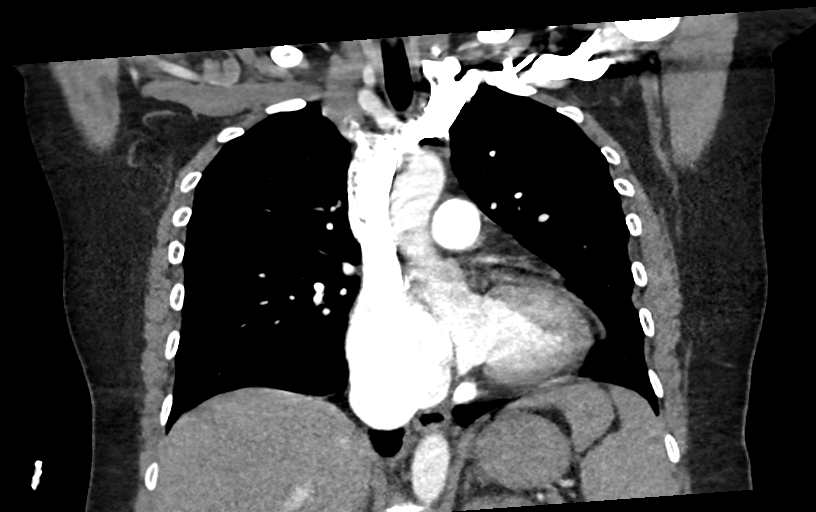
[im 108/144  soft-tissue]
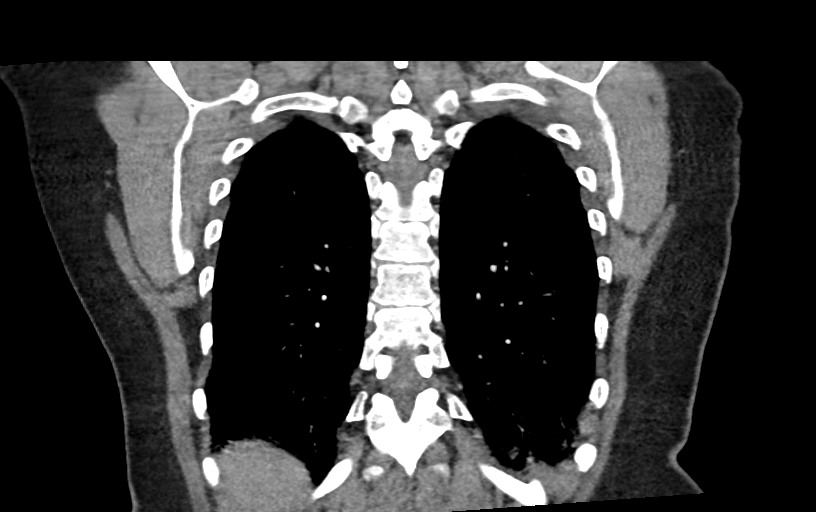

[18 of 46 positions shown; findings below may reference images not displayed]

FINDINGS: Cardiovascular: Cardiomegaly. No evidence of aortic aneurysm. No
filling defects in the pulmonary arteries to suggest pulmonary
emboli.

Mediastinum/Nodes: No mediastinal, hilar, or axillary adenopathy.
Trachea and esophagus are unremarkable.

Lungs/Pleura: Mild emphysema.  Dependent atelectasis.  No effusions.

Upper Abdomen: Imaging into the upper abdomen shows no acute
findings.

Musculoskeletal: Chest wall soft tissues are unremarkable. No acute
bony abnormality.

Review of the MIP images confirms the above findings.
IMPRESSION: No evidence of pulmonary embolus.

Cardiomegaly.

Aortic Atherosclerosis (ULX7L-4ZO.O) and Emphysema (ULX7L-R23.5).

## 2020-12-05 ENCOUNTER — Ambulatory Visit: Payer: Medicare Other | Admitting: Internal Medicine

## 2020-12-17 ENCOUNTER — Ambulatory Visit (HOSPITAL_COMMUNITY): Payer: Medicare Other | Attending: Cardiology

## 2020-12-17 ENCOUNTER — Encounter (HOSPITAL_COMMUNITY): Payer: Self-pay | Admitting: Physician Assistant

## 2020-12-19 ENCOUNTER — Ambulatory Visit: Payer: Medicare Other | Admitting: Internal Medicine

## 2020-12-19 ENCOUNTER — Other Ambulatory Visit: Payer: Self-pay

## 2020-12-19 ENCOUNTER — Ambulatory Visit (HOSPITAL_COMMUNITY)
Admission: RE | Admit: 2020-12-19 | Discharge: 2020-12-19 | Disposition: A | Discharge status: Home / Self Care | Payer: Medicare Other | Source: Ambulatory Visit | Attending: Physician Assistant | Admitting: Physician Assistant

## 2020-12-19 DIAGNOSIS — I071 Rheumatic tricuspid insufficiency: Secondary | ICD-10-CM | POA: Diagnosis present

## 2020-12-19 LAB — ECHOCARDIOGRAM COMPLETE
Area-P 1/2: 3.99 cm2
Calc EF: 49 %
S' Lateral: 3.4 cm
Single Plane A2C EF: 54.8 %
Single Plane A4C EF: 45.9 %

## 2020-12-19 NOTE — Progress Notes (Signed)
  Echocardiogram 2D Echocardiogram has been performed.  Delcie Roch 12/19/2020, 11:01 AM

## 2020-12-22 NOTE — Progress Notes (Signed)
Cardiology Office Note   Date:  12/24/2020   ID:  Tina Fitzgerald, DOB 03-30-1971, MRN [id number]  PCP:  Tina Emery, MD  Cardiologist:   Tina Pates, MD   F/U of tricuspid regurgitation     History of Present Illness: Tina Fitzgerald is a 50 y.o. female with a history of tricuspid regurgitation, hepatitis C, hep B, IVDA pulmonary embolus, endocarditis (2007, complicated by cardiac tamponade), COPD.  The patient was previously followed by Tina Fitzgerald.  She was last seen in clinic in 2016 The pt has a history of tricuspid valve endocarditis (MSSA) in 2007 due to of polysubstance abuse in the past.   Underwent surgical debridement of the tricuspid valve with drainage  And PFO closure   She also had drainage of  pericardial effusion.    IN 2014 she underwent TEE  Which showed wide open TR   She also had a R / L heart cath that showed no signifi CAD   Mild LV dysfunction  TEE confirmed severe/wide open TR in 2014  Seen by Tina Fitzgerald and cardiac surgery At the time the pt was having UTI, GYn infections   Recommendation was for conservative / meidcal therapy.    Repeat Echo in 2015 showed moderate TR.  Again recommendation was for continued antibiotic prophylaxis before dental work or endoscopies. I sawa the pt in Jan 2021   She was seen by Tina Fitzgerald in June 2021 SInce last summer, the  pt says she is feeling good   Her breathing is good   She denies any signficant edema  No CP   No dizziness   Has cut way back on cigs to 2 per day      Current Meds  Medication Sig  . Ascorbic Acid (VITAMIN C) 1000 MG tablet   . diazepam (VALIUM) 10 MG tablet Take 10 mg by mouth 2 (two) times daily as needed for anxiety.  . famotidine (PEPCID) 20 MG tablet Take 20 mg by mouth daily.  Marland Kitchen ibuprofen (ADVIL,MOTRIN) 800 MG tablet Take 1 tablet by mouth every 8 (eight) hours as needed for mild pain.   Marland Kitchen ipratropium (ATROVENT HFA) 17 MCG/ACT inhaler Inhale 2 puffs into the lungs every 6 (six) hours as needed  for wheezing.  . Multiple Vitamins-Calcium (ONE-A-DAY WOMENS FORMULA PO) Take 1 tablet by mouth daily.  . Omega-3 Fatty Acids (FISH OIL) 1000 MG CAPS Take 3,000 mg by mouth daily.   . potassium gluconate 595 MG TABS tablet Take 1 tablet (595 mg total) by mouth every other day.  . rosuvastatin (CRESTOR) 20 MG tablet Take 1 tablet (20 mg total) by mouth daily.  . sertraline (ZOLOFT) 100 MG tablet Take 1 tablet (100 mg total) by mouth daily.     Allergies:   Amoxicillin, Latex, and Penicillins   Past Medical History:  Diagnosis Date  . Anxiety   . ARF (acute renal failure) (HCC)    a. 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  . Arthritis    "knees" (05/09/2013)  . Asthma   . Cardiac arrest (HCC)    a. 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  . Cardiac tamponade    a. s/p subxiphoid window 2007 in setting of TV endocarditis. b. She had suffered a mild anoxic injury during her recent hospitalization felt to be related to hypoperfusion Tina Fitzgerald 06/15/2006 (05/09/2013)  . Chronic headaches   . Depression   . Endocarditis  a. 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  Marland Kitchen GERD (gastroesophageal reflux disease)   . 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 Hepatitis C   . History of tricuspid valve disorder 04/16/2013  . Hx: UTI (urinary tract infection)   . Memory loss, short term    "since OHS in 2007" (05/09/2013)  . PFO (patent foramen ovale)    a. s/p closure in 2007.  Marland Kitchen Septic pulmonary embolism (HCC)    a. During admission for TV endocarditis.  . Severe tricuspid regurgitation    a. By TEE/cath 04/2013 - for outpt referral to [facility].   . Tobacco abuse      Past Surgical History:  Procedure Laterality Date  . CARDIAC CATHETERIZATION     multiple/notes 06/15/2006 (05/09/2013)  . DILATION AND CURETTAGE OF UTERUS     "couple of those years ago; one a couple years ago after I lost a pregnancy" (05/09/2013)  . LEFT AND RIGHT HEART CATHETERIZATION WITH CORONARY ANGIOGRAM N/A 05/08/2013   Procedure: LEFT AND RIGHT HEART CATHETERIZATION WITH CORONARY ANGIOGRAM;  Surgeon: Tina Hazel, MD;  Location: [facility] CATH LAB;  Service: Cardiovascular;  Laterality: N/A;  . PATENT FORAMEN OVALE CLOSURE  04/22/2006   closed during tricuspid valve surgery  . SUBXYPHOID PERICARDIAL WINDOW  05/07/2006   for large postoperative pericardial effusion   . TEE WITHOUT CARDIOVERSION N/A 05/08/2013   Procedure: TRANSESOPHAGEAL ECHOCARDIOGRAM (TEE);  Surgeon: Tina Bunting, MD;  Location[facility] ENDOSCOPY;  Service: Cardiovascular;  Laterality: N/A;  . TRICUSPID VALVE SURGERY  04/22/2006   debridement of septal leaflet for MSSA bacterial endocarditis with closure PFO     Social History:  The patient  reports that she has been smoking cigarettes. She has a 22.50 pack-year smoking history. She has never used smokeless tobacco. She reports current alcohol use of about 7.0 standard drinks of alcohol per week. She reports current drug use. Drugs: Marijuana, Heroin, "Crack" cocaine, and Cocaine.   Family History:  The patient's family history includes Alcohol abuse in her father; Arthritis in her father; Diabetes in her daughter; Drug abuse in her father; Heart disease in her maternal grandmother; Hyperlipidemia in her brother and mother; Mental illness in an other family member; Ovarian cancer in her cousin.    ROS:  Please see the history of present illness. All other systems are reviewed and  Negative to the above problem except as noted.    PHYSICAL EXAM: VS:  BP 102/62   Pulse 88   Ht 5' 5.5" (1.664 m)   Wt 182 lb (82.6 kg)   SpO2 96%   BMI 29.83 kg/m   GEN: Well  nourished, well developed, in no acute distress  HEENT: normal  Neck: JVP increased Cardiac: RRR; I-II/VI systolic murmur LSB;,no LE edema  Respiratory:  clear to auscultation bilaterally, GI: soft, nontender, nondistended, + BS  No hepatomegaly  MS: no deformity Moving all extremities   Skin: warm and dry, no rash Neuro:  Strength and sensation are intact Psych: euthymic mood, full affect   EKG:  EKG shows SR  88 bpm   rSR' V1/ V2  Nonspecific ST changes     Lipid Panel    Component Value Date/Time   CHOL 127 10/10/2020 1540   TRIG 127 10/10/2020  1540   HDL 44 10/10/2020 1540   CHOLHDL 2.9 10/10/2020 1540   CHOLHDL 3 01/16/2014 1440   VLDL 18.0 01/16/2014 1440   LDLCALC 60 10/10/2020 1540      Wt Readings from Last 3 Encounters:  12/23/20 182 lb (82.6 kg)  04/29/20 186 lb (84.4 kg)  11/23/19 189 lb (85.7 kg)      ASSESSMENT AND PLAN:  1.  Tricuspid regurgitation.  Patient with a history of remote endocarditis t.  She is status post surgery back in 2007 along with a PFO closure and then window placement for  tamponade.  She was folloowed in cardiology and had repeat evaluation (includiing cath) back in 2014.   Seen by Surgery,   At the time medical Rx was recomm given concern for possible infections, posssible pacer requirements    She was last seen by Tina Fitzgerald in Summer 2021 Currently she says she is feeling pretty good   Has reallly cut back on tobacco.   ON exam today, her volume status looks good   I have reviewed echo that was just done this year    Again, TR remains severe.   Would keep on current regimen.  Will review images again, revisit possible repair.      2.  COPD Pt is moving air.   Lungs sounds improved .   She has cut back on tob signficaintly Congratulated her on this   Keep at it  3  Lipds   Lipids are very good   LDL 60  HDL 44  4  Diet:  Discussed limit carbs/sweets/sugar   WIll check CBC and CMET        Current medicines are reviewed at  length with the patient today.  The patient does not have concerns regarding medicines.  Signed, Tina Pates, MD  12/24/2020 3:32 AM    [facility] [address] [place], [place], [place]  [address] Phone: [phone]; Fax: [phone]

## 2020-12-23 ENCOUNTER — Ambulatory Visit (INDEPENDENT_AMBULATORY_CARE_PROVIDER_SITE_OTHER): Payer: Medicare Other | Admitting: Internal Medicine

## 2020-12-23 ENCOUNTER — Encounter: Payer: Self-pay | Admitting: Internal Medicine

## 2020-12-23 ENCOUNTER — Other Ambulatory Visit: Payer: Self-pay

## 2020-12-23 VITALS — BP 102/62 | HR 88 | Ht 65.5 in | Wt 182.0 lb

## 2020-12-23 DIAGNOSIS — I071 Rheumatic tricuspid insufficiency: Secondary | ICD-10-CM

## 2020-12-23 DIAGNOSIS — E785 Hyperlipidemia, unspecified: Secondary | ICD-10-CM

## 2020-12-23 NOTE — Patient Instructions (Signed)
Medication Instructions:  No changes *If you need a refill on your cardiac medications before your next appointment, please call your pharmacy*   Lab Work: Cbc, cmet If you have labs (blood work) drawn today and your tests are completely normal, you will receive your results only by: Marland Kitchen MyChart Message (if you have MyChart) OR . A paper copy in the mail If you have any lab test that is abnormal or we need to change your treatment, we will call you to review the results.   Testing/Procedures: none   Follow-Up: At [facility], you and your health needs are our priority.  As part of our continuing mission to provide you with exceptional heart care, we have created designated Provider Care Teams.  These Care Teams include your primary Cardiologist (physician) and Advanced Practice Providers (APPs -  Physician Assistants and Nurse Practitioners) who all work together to provide you with the care you need, when you need it.   Your next appointment:   6 month(s)  The format for your next appointment:   In Person  Provider:   You may see Dietrich Pates, MD or one of the following Advanced Practice Providers on your designated Care Team:    Tereso Newcomer, PA-C  Chelsea Aus, [place]    Other Instructions

## 2020-12-24 LAB — COMPREHENSIVE METABOLIC PANEL
ALT: 11 IU/L (ref 0–32)
AST: 10 IU/L (ref 0–40)
Albumin/Globulin Ratio: 2 (ref 1.2–2.2)
Albumin: 4.4 g/dL (ref 3.8–4.8)
Alkaline Phosphatase: 79 IU/L (ref 44–121)
BUN/Creatinine Ratio: 21 (ref 9–23)
BUN: 16 mg/dL (ref 6–24)
Bilirubin Total: 1.1 mg/dL (ref 0.0–1.2)
CO2: 24 mmol/L (ref 20–29)
Calcium: 9.5 mg/dL (ref 8.7–10.2)
Chloride: 105 mmol/L (ref 96–106)
Creatinine, Ser: 0.76 mg/dL (ref 0.57–1.00)
GFR calc Af Amer: 107 mL/min/{1.73_m2} (ref >59)
GFR calc non Af Amer: 92 mL/min/{1.73_m2} (ref >59)
Globulin, Total: 2.2 g/dL (ref 1.5–4.5)
Glucose: 118 mg/dL — ABNORMAL HIGH (ref 65–99)
Potassium: 3.9 mmol/L (ref 3.5–5.2)
Sodium: 143 mmol/L (ref 134–144)
Total Protein: 6.6 g/dL (ref 6.0–8.5)

## 2020-12-24 LAB — CBC
Hematocrit: 48 % — ABNORMAL HIGH (ref 34.0–46.6)
Hemoglobin: 16.2 g/dL — ABNORMAL HIGH (ref 11.1–15.9)
MCH: 31.8 pg (ref 26.6–33.0)
MCHC: 33.8 g/dL (ref 31.5–35.7)
MCV: 94 fL (ref 79–97)
Platelets: 188 10*3/uL (ref 150–450)
RBC: 5.1 x10E6/uL (ref 3.77–5.28)
RDW: 13 % (ref 11.7–15.4)
WBC: 8.2 10*3/uL (ref 3.4–10.8)

## 2021-01-26 LAB — HM PAP SMEAR: HM Pap smear: NEGATIVE

## 2021-01-30 LAB — HM PAP SMEAR: HM Pap smear: NEGATIVE

## 2021-03-12 ENCOUNTER — Ambulatory Visit: Payer: Medicare Other | Admitting: Obstetrics and Gynecology

## 2021-05-07 LAB — FOLLICLE STIMULATING HORMONE: FSH: 42.9

## 2021-05-14 ENCOUNTER — Telehealth: Payer: Self-pay

## 2021-05-14 NOTE — Telephone Encounter (Signed)
Tina Fitzgerald is a 50 y.o. female was called and contacted re: New pt Pre appt call to collect history information. Pt verified using 2 identifiers. Confirmation that I am speaking with the correct person.  Chart was updated: -Allergy -Medication -Confirm pharmacy -OB history  Pt was notified to arrive 15 min early and we will need a urine sample when she arrives. Pt verbalized understanding.

## 2021-05-14 NOTE — Telephone Encounter (Signed)
Attempt made to contact Tina Fitzgerald is a 50 y.o. female re: New pt Pre appt call to collect history information.  -Allergy -Medication -Confirm pharmacy -OB history   Pt was not available. LM on the VM for the patient to call back

## 2021-05-20 ENCOUNTER — Encounter: Payer: Self-pay | Admitting: Obstetrics and Gynecology

## 2021-05-20 ENCOUNTER — Ambulatory Visit (INDEPENDENT_AMBULATORY_CARE_PROVIDER_SITE_OTHER): Payer: Medicare Other | Admitting: Obstetrics and Gynecology

## 2021-05-20 ENCOUNTER — Other Ambulatory Visit: Payer: Self-pay

## 2021-05-20 VITALS — BP 118/81 | HR 81 | Ht 65.5 in | Wt 177.0 lb

## 2021-05-20 DIAGNOSIS — R350 Frequency of micturition: Secondary | ICD-10-CM | POA: Diagnosis not present

## 2021-05-20 DIAGNOSIS — N393 Stress incontinence (female) (male): Secondary | ICD-10-CM | POA: Diagnosis not present

## 2021-05-20 DIAGNOSIS — N3000 Acute cystitis without hematuria: Secondary | ICD-10-CM | POA: Diagnosis not present

## 2021-05-20 DIAGNOSIS — N3281 Overactive bladder: Secondary | ICD-10-CM | POA: Diagnosis not present

## 2021-05-20 LAB — POCT URINALYSIS DIPSTICK
Appearance: ABNORMAL
Bilirubin, UA: NEGATIVE
Blood, UA: NEGATIVE
Glucose, UA: NEGATIVE
Ketones, UA: NEGATIVE
Leukocytes, UA: NEGATIVE
Nitrite, UA: POSITIVE
Protein, UA: NEGATIVE
Spec Grav, UA: >=1.03 — AB (ref 1.010–1.025)
Urobilinogen, UA: 0.2 E.U./dL
pH, UA: 5 (ref 5.0–8.0)

## 2021-05-20 MED ORDER — MIRABEGRON ER 25 MG PO TB24: 25.0000 mg | ORAL_TABLET | Freq: Every day | ORAL | 5 refills | Status: DC

## 2021-05-20 NOTE — Progress Notes (Signed)
[facility] New Patient Evaluation and Consultation  Referring Provider: Annamaria HellingWein, Robert, MD PCP: Rometta EmeryGarba, Mohammad L, MD Date of Service: 05/20/2021  SUBJECTIVE Chief Complaint: New Patient (Initial Visit)  History of Present Illness: Tina Fitzgerald is a 50 y.o. White or Caucasian female seen in consultation at the request of Dr. Aldona BarWein for evaluation of incontinence.    Review of records from Dr Aldona BarWein significant for: Has leakage with coughing and sneezing and has to wear pads daily. Interested in treatment.   Urinary Symptoms: Leaks urine with cough/ sneeze, laughing, exercise, lifting, with a full bladder, and with urgency. Feels like SUI = UUI.  Leaks almost constantly  Pad use: 5 pads per day.   She is bothered by her UI symptoms. Has tried kegel exercises, symptoms have worsened.  Day time voids 8-10.  Nocturia: 0 times per night to void. Voiding dysfunction: she empties her bladder well.  does not use a catheter to empty bladder.  When urinating, she feels a weak stream and difficulty starting urine stream Drinks: drinks 2 cups coffee per day- has cut back but has noticed some improvement. Otherwise drinks water.   UTIs:  0  UTI's in the last year.   Denies history of blood in urine and kidney or bladder stones  Pelvic Organ Prolapse Symptoms:                  She Denies a feeling of a bulge the vaginal area.   Bowel Symptom: Bowel movements: 1-2 time(s) per day Stool consistency: hard or soft  Straining: yes.  Splinting: no.  Incomplete evacuation: no.  She Admits to accidental bowel leakage / fecal incontinence  Occurs: only when she has diarrhea  Consistency with leakage: liquid Bowel regimen: fiber Last colonoscopy: has not had one, but is doing the cologuard  Sexual Function Sexually active: yes.  Pain with sex:  sometimes- partner is larger and this can cause pain.   Pelvic Pain Denies pelvic pain Does have pain going to the bathroom.   Past  Medical History:  Past Medical History:  Diagnosis Date   Anxiety    ARF (acute renal failure) (HCC)    a. 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   Arthritis    "knees" (05/09/2013)   Asthma    Cardiac arrest (HCC)    a. 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   Cardiac tamponade    a. s/p subxiphoid window 2007 in setting of TV endocarditis. b. 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 s/p closure in 2007.   Septic pulmonary embolism (HCC)    a. During admission for TV endocarditis.   Severe tricuspid  regurgitation    a. By TEE/cath 04/2013 - for outpt referral to [facility].    Tobacco abuse      Past Surgical History:   Past Surgical History:  Procedure Laterality Date   CARDIAC CATHETERIZATION     multiple/notes 06/15/2006 (05/09/2013)   DILATION AND CURETTAGE OF UTERUS     "couple of those years ago; one a couple years ago after I lost a pregnancy" (05/09/2013)   LEFT AND RIGHT HEART CATHETERIZATION WITH CORONARY ANGIOGRAM N/A 05/08/2013   Procedure: LEFT AND RIGHT HEART CATHETERIZATION WITH CORONARY ANGIOGRAM;  Surgeon: Kathleene Hazel, MD;  Location: [facility] CATH LAB;  Service:  Cardiovascular;  Laterality: N/A;   PATENT FORAMEN OVALE CLOSURE  04/22/2006   closed during tricuspid valve surgery   SUBXYPHOID PERICARDIAL WINDOW  05/07/2006   for large postoperative pericardial effusion    TEE WITHOUT CARDIOVERSION N/A 05/08/2013   Procedure: TRANSESOPHAGEAL ECHOCARDIOGRAM (TEE);  Surgeon: Lewayne Bunting, MD;  Location: [facility] ENDOSCOPY;  Service: Cardiovascular;  Laterality: N/A;   TRICUSPID VALVE SURGERY  04/22/2006   debridement of septal leaflet for MSSA bacterial endocarditis with closure PFO     Past OB/GYN History: OB History  Gravida Para Term Preterm AB Living  9 5 5   3 6   SAB IAB Ectopic Multiple Live Births  1     1 6     # Outcome Date GA Lbr Len/2nd Weight Sex Delivery Anes PTL Lv  9 SAB 2013          8 Term 09/03/03    F Vag-Spont Local  LIV  7 Term 04/20/98    M Vag-Spont Local  LIV  6A Term 12/02/93    F Vag-Spont Local  LIV  6B Term 12/02/93    F Vag-Spont Local  LIV  5 Term 04/11/90    F Vag-Spont EPI  LIV  4 Term 06/13/88    M Vag-Spont Local  LIV  3 Gravida           2 AB           1 AB             Menopausal: Yes, at age 5, Denies vaginal bleeding since menopause    Medications: She has a current medication list which includes the following prescription(s): vitamin c, diazepam, famotidine, ibuprofen, ipratropium, mirabegron er, multiple vitamins-calcium, fish oil, potassium gluconate, rosuvastatin, and sertraline.   Allergies: Patient is allergic to amoxicillin, latex, and penicillins.   Social History:  Social History   Tobacco Use   Smoking status: Every Day    Packs/day: 0.75    Years: 30.00    Pack years: 22.50    Types: Cigarettes   Smokeless tobacco: Never  Vaping Use   Vaping Use: Never used  Substance Use Topics   Alcohol use: Yes    Alcohol/week: 7.0 standard drinks    Types: 7 Cans of beer per week   Drug use: Yes    Types: Marijuana, Heroin, "Crack" cocaine, Cocaine    Comment: 05/09/2013 "<1 joint/day; last heroin  & crack was before heart OR in 2007"    Relationship status: divorced She lives with brother.   She is not employed- permanently disabled. Regular exercise: No History of abuse: No  Family History:   Family History  Problem Relation Age of Onset   Hyperlipidemia Mother    Hyperlipidemia Brother    Alcohol abuse Father    Arthritis Father    Drug abuse Father  Heart disease Maternal Grandmother    Diabetes Daughter    Ovarian cancer Cousin    Mental illness Other    Cancer Neg Hx    Early death Neg Hx    Hypertension Neg Hx    Kidney disease Neg Hx    Learning disabilities Neg Hx    Stroke Neg Hx      Review of Systems: Review of Systems  Constitutional:  Negative for fever, malaise/fatigue and weight loss.  Respiratory:  Positive for cough, shortness of breath and wheezing.   Cardiovascular:  Positive for chest pain and leg swelling. Negative for palpitations.  Gastrointestinal:  Negative for abdominal pain and blood in stool.  Genitourinary:  Negative for dysuria.  Musculoskeletal:  Negative for myalgias.  Skin:  Negative for rash.  Neurological:  Positive for dizziness and headaches.  Endo/Heme/Allergies:  Bruises/bleeds easily.  Psychiatric/Behavioral:  Positive for depression. The patient is nervous/anxious.     OBJECTIVE Physical Exam: Vitals:   05/20/21 1533  BP: 118/81  Pulse: 81  Weight: 177 lb (80.3 kg)  Height: 5' 5.5" (1.664 m)    Physical Exam Constitutional:      General: She is not in acute distress. Pulmonary:     Effort: Pulmonary effort is normal.  Abdominal:     General: There is no distension.     Palpations: Abdomen is soft.     Tenderness: There is no abdominal tenderness. There is no rebound.  Musculoskeletal:        General: No swelling. Normal range of motion.  Skin:    General: Skin is warm and dry.     Findings: No rash.  Neurological:     Mental Status: She is alert and oriented to person, place, and time.  Psychiatric:         Mood and Affect: Mood normal.        Behavior: Behavior normal.     GU / Detailed Urogynecologic Evaluation:  Pelvic Exam: Normal external female genitalia; Bartholin's and Skene's glands normal in appearance; urethral meatus normal in appearance, no urethral masses or discharge.   CST: negative  Speculum exam reveals normal vaginal mucosa without atrophy. Cervix normal appearance. Uterus normal single, nontender. Adnexa no mass, fullness, tenderness.     Pelvic floor strength III/V\  Pelvic floor musculature: Right levator non-tender, Right obturator non-tender, Left levator non-tender, Left obturator non-tender  POP-Q:   POP-Q  -2                                            Aa   -2                                           Ba  -6                                              C   3                                            Gh  1.5                                            Pb  8                                            tvl   -2                                            Ap  -2                                            Bp  -8                                              D     Rectal Exam:  Normal sphincter tone, no distal rectocele, enterocoele not present, no rectal masses, no sign of dyssynergia when asking the patient to bear down.  Post-Void Residual (PVR) by Bladder Scan: In order to evaluate bladder emptying, we discussed obtaining a postvoid residual and she agreed to this procedure.  Procedure: The ultrasound unit was placed on the patient's abdomen in the suprapubic region after the patient had voided. A PVR of 10 ml was obtained by bladder scan.  Laboratory Results: POC urine: + nitrites  I visualized the urine specimen, noting the specimen to be dark yellow  ASSESSMENT AND PLAN Tina Fitzgerald is a 50 y.o. with:  1. Overactive bladder   2. Urinary frequency   3. SUI (stress urinary incontinence, female)    She is only interested in  non-surgical options due to her extensive medical/ cardiac history   OAB We discussed the symptoms of overactive bladder (OAB), which include urinary urgency, urinary frequency, nocturia, with or without urge incontinence.  While we do not know the exact etiology of OAB, several treatment options exist. We discussed management including behavioral therapy (decreasing bladder irritants, urge suppression strategies, timed voids, bladder retraining), physical therapy, medication.  - Will start medication- Myrbetriq 25mg  daily.   2. Urinary frequency - nitrites noted on UA today, will send for culture to r/o infection  3. SUI For treatment of stress urinary incontinence,  non-surgical options include expectant management, weight loss, physical therapy, as well as a pessary and surgical options.  - She would like to try a pessary, will return for a fitting.   Return for pessary fitting  , MD   Medical Decision Making:  - Reviewed/ ordered a clinical laboratory test - Review and summation of prior records

## 2021-05-20 NOTE — Patient Instructions (Signed)
Today we talked about ways to manage bladder urgency such as altering your diet to avoid irritative beverages and foods (bladder diet) as well as attempting to decrease stress and other exacerbating factors.    The Most Bothersome Foods* The Least Bothersome Foods*  Coffee - Regular & Decaf Tea - caffeinated Carbonated beverages - cola, non-colas, diet & caffeine-free Alcohols - Beer, Red Wine, White Wine, [address] - Grapefruit, [place], Orange, [employer] - Cranberry, Grapefruit, Orange, Pineapple Vegetables - Tomato & Tomato Products Flavor Enhancers - Hot peppers, Spicy foods, Chili, Horseradish, Vinegar, Monosodium glutamate (MSG) Artificial Sweeteners - NutraSweet, Sweet 'N Low, Equal (sweetener), Saccharin Ethnic foods - [place], [place], [place] food [employer] - low-fat & whole Fruits - Bananas, Blueberries, Honeydew melon, Pears, Raisins, Watermelon Vegetables - Broccoli, [address] Sprouts, [place], Carrots, Cauliflower, [place], Cucumber, Mushrooms, Peas, Radishes, Squash, Zucchini, White potatoes, Sweet potatoes & yams Poultry - Chicken, Eggs, [place], [employer] - Beef, Diplomatic Services operational officer, Lamb Seafood - Shrimp, [place] fish, Salmon Grains - Oat, Rice Snacks - Pretzels, Popcorn  *Lenward Chancellor et al. Diet and its role in interstitial cystitis/bladder pain syndrome (IC/BPS) and comorbid conditions. [employer]. BJU Int. 2012 Jan 11.   We discussed the symptoms of overactive bladder (OAB), which include urinary urgency, urinary frequency, night-time urination, with or without urge incontinence.  We discussed management including behavioral therapy (decreasing bladder irritants by following a bladder diet, urge suppression strategies, timed voids, bladder retraining), physical therapy, medication.  You were given Myrbetriq 25 mg.  It can take a month to start working so give it time, but if you have bothersome side effects call sooner and we can try a different medication.  Call us if  you have trouble filling the prescription or if it's not covered by your insurance.  For treatment of stress urinary incontinence, which is leakage with physical activity/movement/strainging/coughing, we discussed expectant management versus nonsurgical options versus surgery. Nonsurgical options include weight loss, physical therapy, as well as a pessary.

## 2021-05-20 NOTE — Addendum Note (Signed)
Addended by: Marguerita Beards on: 05/20/2021 04:43 PM   Modules accepted: Orders

## 2021-05-23 LAB — URINE CULTURE

## 2021-05-25 MED ORDER — NITROFURANTOIN MONOHYD MACRO 100 MG PO CAPS: 100.0000 mg | ORAL_CAPSULE | Freq: Two times a day (BID) | ORAL | 0 refills | Status: AC

## 2021-05-25 NOTE — Addendum Note (Signed)
Addended by: Marguerita Beards on: 05/25/2021 12:27 PM   Modules accepted: Orders

## 2021-06-01 NOTE — Progress Notes (Deleted)
[facility]   Subjective:     Chief Complaint: No chief complaint on file.  History of Present Illness: Tina Fitzgerald is a 50 y.o. female with stress incontinence and OAB who presents today for a pessary fitting.   She started Myrbetriq 25mg  at last visit.  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   Past Surgical History: She  has a past surgical history that includes Tricuspid valve surgery (04/22/2006); Patent foramen ovale closure (04/22/2006); Cardiac catheterization; Subxyphoid pericardial window (05/07/2006); Dilation and curettage of uterus; TEE without cardioversion (N/A, 05/08/2013); and left and right heart catheterization with coronary angiogram (N/A, 05/08/2013).   Medications: She has a current medication list which includes the following prescription(s): vitamin c, diazepam, famotidine, ibuprofen, ipratropium, mirabegron er, multiple vitamins-calcium, fish oil, potassium gluconate, rosuvastatin, and sertraline.   Allergies: Patient is allergic to amoxicillin, latex, and penicillins.   Social History: Patient  reports that she has been smoking cigarettes. She has a 22.50 pack-year smoking history. She has never used smokeless tobacco. She reports current alcohol use of about 7.0 standard drinks of alcohol per week. She reports current drug use. Drugs: Marijuana, Heroin, "Crack" cocaine, and Cocaine.      Objective:    There were no vitals taken for this visit. Gen: No apparent distress, A&O x 3. Pelvic Exam:  Normal external female genitalia; Bartholin's and Skene's glands normal in appearance; urethral meatus {urethra:24773}, no urethral masses or discharge.   A size *** {pessary type:24772} pessary was fitted. It was comfortable, stayed in place with valsalva and was an appropriate size on examination, with one finger fitting between the pessary and the vaginal walls. We tied a string to it and the patient demonstrated proper removal and replacement.  No flowsheet data found.   Assessment/Plan:    Assessment: [name] is a 50 y.o. with stress incontinence and OAB who presents for a pessary fitting. Plan: She was fitted with a *** {pessary type:24772} pessary. She will {pessary plan:24776}. She will {use:24778} {lubricant:24777}.   Follow-up in *** {days/wks/mos/yrs:310907} for a pessary check or sooner as needed.  All questions were answered.    44, MD

## 2021-06-02 ENCOUNTER — Ambulatory Visit: Payer: Medicare Other | Admitting: Obstetrics and Gynecology

## 2021-06-17 NOTE — Progress Notes (Signed)
[facility]   Subjective:     Chief Complaint: pessary fitting  History of Present Illness: Tina Fitzgerald is a 50 y.o. female with stress incontinence and OAB who presents today for a pessary fitting.    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   Past Surgical History: She  has a past surgical history that includes Tricuspid valve surgery (04/22/2006); Patent foramen ovale closure (04/22/2006); Cardiac catheterization; Subxyphoid pericardial window (05/07/2006); Dilation and curettage of uterus; TEE without cardioversion (N/A, 05/08/2013); and left and right heart catheterization with coronary angiogram (N/A, 05/08/2013).   Medications: She has a current medication list which includes the following prescription(s): vitamin c, diazepam, famotidine, ibuprofen, ipratropium, mirabegron er, multiple vitamins-calcium, fish oil, potassium gluconate, rosuvastatin, and sertraline.   Allergies: Patient is allergic to amoxicillin, latex, and penicillins.   Social History: Patient  reports that she has been smoking cigarettes. She has a 22.50 pack-year smoking history. She has never used smokeless tobacco. She reports current alcohol use of about 7.0 standard drinks per week. She reports current drug use. Drugs: Marijuana, Heroin, "Crack" cocaine, and Cocaine.      Objective:    BP 128/87   Pulse 61   Ht 5' 5.5" (1.664 m)   Wt 182 lb (82.6 kg)   BMI 29.83 kg/m  Gen: No apparent distress, A&O x 3. Pelvic Exam: Normal external  female genitalia; Bartholin's and Skene's glands normal in appearance; urethral meatus normal in appearance, no urethral masses or discharge.   A size #2 (90mm) incontinence dish pessary was fitted. It was comfortable, stayed in place with valsalva and was an appropriate size on examination, with one finger fitting between the pessary and the vaginal walls. The patient demonstrated proper removal and replacement. Lot# [id number], Exp 09/14/24  POP-Q (05/20/21):    POP-Q   -2                                            Aa   -2                                           Ba   -6                                              C    3                                            Gh   1.5  Pb   8                                            tvl    -2                                            Ap   -2                                            Bp   -8                                              D        Assessment/Plan:    Assessment: Tina Fitzgerald is a 50 y.o. with stress incontinence and OAB who presents for a pessary fitting. Plan: She was fitted with a #2 incontinence dish pessary. She will remove at least weekly or before intercourse .   Follow-up in 2 weeks for a pessary check or sooner as needed.  All questions were answered.    Marguerita Beards, MD

## 2021-06-18 ENCOUNTER — Encounter: Payer: Self-pay | Admitting: Obstetrics and Gynecology

## 2021-06-18 ENCOUNTER — Ambulatory Visit (INDEPENDENT_AMBULATORY_CARE_PROVIDER_SITE_OTHER): Payer: Medicare Other | Admitting: Obstetrics and Gynecology

## 2021-06-18 ENCOUNTER — Other Ambulatory Visit: Payer: Self-pay

## 2021-06-18 VITALS — BP 128/87 | HR 61 | Ht 65.5 in | Wt 182.0 lb

## 2021-06-18 DIAGNOSIS — N393 Stress incontinence (female) (male): Secondary | ICD-10-CM | POA: Diagnosis not present

## 2021-06-18 NOTE — Patient Instructions (Signed)
You were fitted with a incontinence dish pessary.  Come back in 2-3 weeks to see how it is working.  You can take it out every week, or sooner if you desire. Clean with liquid soap and water in between uses and leave out over night on the night that you remove it. Call for any problems.

## 2021-06-19 LAB — COLOGUARD

## 2021-07-01 ENCOUNTER — Ambulatory Visit: Payer: Medicare Other | Admitting: Obstetrics and Gynecology

## 2021-07-01 NOTE — Progress Notes (Deleted)
[facility]   Subjective:     Chief Complaint: No chief complaint on file.  History of Present Illness: Ayra L Abrell is a 50 y.o. female with {PFD symptoms:24771} who presents for a pessary check. She is using a size *** {pessary type:24772} pessary. The pessary has been working well and she has no complaints. She {ACTION; IS/IS DVV:[id number]} using vaginal estrogen. She denies vaginal bleeding.  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   Past Surgical History: She  has a past surgical history that includes Tricuspid valve surgery (04/22/2006); Patent foramen ovale closure (04/22/2006); Cardiac catheterization; Subxyphoid pericardial window (05/07/2006); Dilation and curettage of uterus; TEE without cardioversion (N/A, 05/08/2013); and left and right heart catheterization with coronary angiogram (N/A, 05/08/2013).   Medications: She has a current medication list which includes the following prescription(s): vitamin c, diazepam, famotidine, ibuprofen, ipratropium, mirabegron er, multiple vitamins-calcium, fish oil, potassium gluconate, rosuvastatin, and sertraline.   Allergies: Patient is allergic to amoxicillin, latex, and penicillins.   Social History: Patient  reports that she has been smoking cigarettes. She has a 22.50 pack-year smoking history. She has never used smokeless tobacco. She reports current alcohol use of about 7.0 standard drinks per week. She reports current drug use. Drugs: Marijuana, Heroin, "Crack" cocaine,  and Cocaine.      Objective:    Physical Exam: There were no vitals taken for this visit. Gen: No apparent distress, A&O x 3. Detailed Urogynecologic Evaluation:  Pelvic Exam: Normal external female genitalia; Bartholin's and Skene's glands normal in appearance; urethral meatus {urethra:24773}, no urethral masses or discharge. The pessary was noted to be {in place:24774}. It was removed and cleaned. Speculum exam revealed {vaginal lesions:24775} in the vagina. The pessary was replaced. It was comfortable to the patient and fit well.   No flowsheet data found.  Laboratory Results: Urine dipstick shows: {ua dip:315374::"negative for all components"}.    Assessment/Plan:    Assessment: Ms. Kobrin is a 50 y.o. with {PFD symptoms:24771} here for a pessary check. She is doing well.  Plan: She will {pessary plan:24776}. She will continue to use {lubricant:24777}. She will follow-up in *** {days/wks/mos/yrs:310907} for a pessary check or sooner as needed.  All questions were answered.   Time Spent:

## 2021-07-11 LAB — COLOGUARD: COLOGUARD: NEGATIVE

## 2021-08-31 ENCOUNTER — Ambulatory Visit: Payer: Medicare Other | Admitting: Internal Medicine

## 2021-11-16 ENCOUNTER — Encounter (HOSPITAL_COMMUNITY): Payer: Self-pay | Admitting: Emergency Medicine

## 2021-11-16 ENCOUNTER — Other Ambulatory Visit: Payer: Self-pay

## 2021-11-16 ENCOUNTER — Ambulatory Visit (HOSPITAL_COMMUNITY)
Admission: EM | Admit: 2021-11-16 | Discharge: 2021-11-16 | Disposition: A | Discharge status: Home / Self Care | Payer: Medicare Other | Attending: Sports Medicine | Admitting: Sports Medicine

## 2021-11-16 ENCOUNTER — Telehealth: Payer: Medicare Other | Admitting: Physician Assistant

## 2021-11-16 DIAGNOSIS — J019 Acute sinusitis, unspecified: Secondary | ICD-10-CM | POA: Diagnosis not present

## 2021-11-16 DIAGNOSIS — R509 Fever, unspecified: Secondary | ICD-10-CM

## 2021-11-16 DIAGNOSIS — R0789 Other chest pain: Secondary | ICD-10-CM | POA: Diagnosis not present

## 2021-11-16 DIAGNOSIS — J069 Acute upper respiratory infection, unspecified: Secondary | ICD-10-CM

## 2021-11-16 DIAGNOSIS — R079 Chest pain, unspecified: Secondary | ICD-10-CM

## 2021-11-16 MED ORDER — AZITHROMYCIN 250 MG PO TABS: 250.0000 mg | ORAL_TABLET | Freq: Every day | ORAL | 0 refills | Status: DC

## 2021-11-16 NOTE — ED Triage Notes (Addendum)
Patient reports chronic sob, sob is worse than usual.  Patient is fatigued, lethargic, sleeping more than usual.  Feverish, cough.  Patient speaks in complete sentences.

## 2021-11-16 NOTE — Patient Instructions (Signed)
Reyes Ivan, thank you for joining [name], PA-C for today's virtual visit.  While this provider is not your primary care provider (PCP), if your PCP is located in our provider database this encounter information will be shared with them immediately following your visit.  Consent: (Patient) Juan Roe Coombs provided verbal consent for this virtual visit at the beginning of the encounter.  Current Medications:  Current Outpatient Medications:    Ascorbic Acid (VITAMIN C) 1000 MG tablet, , Disp: , Rfl:    diazepam (VALIUM) 10 MG tablet, Take 10 mg by mouth 2 (two) times daily as needed for anxiety., Disp: , Rfl:    famotidine (PEPCID) 20 MG tablet, Take 20 mg by mouth daily., Disp: , Rfl:    ibuprofen (ADVIL,MOTRIN) 800 MG tablet, Take 1 tablet by mouth every 8 (eight) hours as needed for mild pain. , Disp: , Rfl:    ipratropium (ATROVENT HFA) 17 MCG/ACT inhaler, Inhale 2 puffs into the lungs every 6 (six) hours as needed for wheezing., Disp: , Rfl:    mirabegron ER (MYRBETRIQ) 25 MG TB24 tablet, Take 1 tablet (25 mg total) by mouth daily., Disp: 30 tablet, Rfl: 5   Multiple Vitamins-Calcium (ONE-A-DAY WOMENS FORMULA PO), Take 1 tablet by mouth daily., Disp: , Rfl:    Omega-3 Fatty Acids (FISH OIL) 1000 MG CAPS, Take 3,000 mg by mouth daily. , Disp: , Rfl:    potassium gluconate 595 MG TABS tablet, Take 1 tablet (595 mg total) by mouth every other day., Disp: 90 tablet, Rfl: 3   rosuvastatin (CRESTOR) 20 MG tablet, Take 1 tablet (20 mg total) by mouth daily., Disp: 90 tablet, Rfl: 3   sertraline (ZOLOFT) 100 MG tablet, Take 1 tablet (100 mg total) by mouth daily., Disp: 30 tablet, Rfl: 5   Medications ordered in this encounter:  No orders of the defined types were placed in this encounter.    *If you need refills on other medications prior to your next appointment, please contact your pharmacy*  Follow-Up: Call back or seek an in-person evaluation if the symptoms worsen or if  the condition fails to improve as anticipated.  Other Instructions Based on what you shared with me, I feel your condition warrants further evaluation and I recommend that you be seen in a face to face visit.   If you are having a true medical emergency please call 911.      For an urgent face to face visit, [facility] has six urgent care centers for your convenience:     [facility] at [facility] Directions [phone] [address] [place], [place] [address]    [facility] Health Urgent Care Center [facility]) Get Driving Directions [phone] [address] [place], [place] [address]  [facility] Health Urgent Care Center [facility]) Get Driving Directions [phone] [address] [place],  [place]  [address]  [facility] Health Urgent Care at [facility] Get Driving Directions [phone] [address], [address] [place], [place] [address]   [facility] Health Urgent Care at [facility] Get Driving Directions  [phone] [address] [place], [place] [address]   [facility] Health Urgent Care at [facility] Directions [phone] [address], [address] [place], [place] [address]   If you have been instructed to have an in-person evaluation today at a local Urgent Care facility, please use the link below. It will take you to a list of all of  our available [facility], including address, phone number and hours of operation. Please do not delay care.  [facility]  If you or a family member do not have a primary care provider, use the link below to schedule a visit and establish care. When you choose a [facility] primary care physician or advanced practice provider, you gain a long-term partner in health. Find a Primary Care Provider  Learn more about [facility]'s in-office and virtual care options: [facility] - Get Care Now

## 2021-11-16 NOTE — Discharge Instructions (Signed)
Complete antibiotic for 5 days  If symptoms or not improving, or you are having fevers after this, would recommend following up with your PCP

## 2021-11-16 NOTE — ED Notes (Signed)
Patient rambles off with many details, nonspecific to questions

## 2021-11-16 NOTE — Progress Notes (Signed)
Virtual Visit Consent   Tina Fitzgerald, you are scheduled for a virtual visit with a [facility] provider today.     Just as with appointments in the office, your consent must be obtained to participate.  Your consent will be active for this visit and any virtual visit you may have with one of our providers in the next 365 days.     If you have a MyChart account, a copy of this consent can be sent to you electronically.  All virtual visits are billed to your insurance company just like a traditional visit in the office.    As this is a virtual visit, video technology does not allow for your provider to perform a traditional examination.  This may limit your provider's ability to fully assess your condition.  If your provider identifies any concerns that need to be evaluated in person or the need to arrange testing (such as labs, EKG, etc.), we will make arrangements to do so.     Although advances in technology are sophisticated, we cannot ensure that it will always work on either your end or our end.  If the connection with a video visit is poor, the visit may have to be switched to a telephone visit.  With either a video or telephone visit, we are not always able to ensure that we have a secure connection.     I need to obtain your verbal consent now.   Are you willing to proceed with your visit today?    Tina Fitzgerald has provided verbal consent on 11/16/2021 for a virtual visit (video or telephone).   Tina Loveless, PA-C   Date: 11/16/2021 2:15 PM   Virtual Visit via Video Note   I, Tina Fitzgerald, connected with  Tina Fitzgerald  ([id number], 11-Oct-1971) on 11/16/21 at  2:00 PM EST by a video-enabled telemedicine application and verified that I am speaking with the correct person using two identifiers.  Location: Patient: Virtual Visit Location Patient: Home Provider: Virtual Visit Location Provider: Home Office   I discussed the limitations of evaluation and management by  telemedicine and the availability of in person appointments. The patient expressed understanding and agreed to proceed.    History of Present Illness: Tina Fitzgerald is a 51 y.o. who identifies as a female who was assigned female at birth, and is being seen today for acute chest pain and fever. Reports she received flu vaccine and shingles vaccine together on 10/22/21. Developed flu-like symptoms following vaccinations with fever, URI symptoms, that lasted for about 5 days. Symptoms improved.   Then 4 days ago she had a sharp, stabbing pain over the left chest ("over my heart"). She does have a history of tricuspid regurgitation, hepatitis C, hep B, IVDA pulmonary embolus, endocarditis (2007, complicated by cardiac tamponade), COPD. Underwent surgical debridement of the tricuspid valve with drainage, and PFO closure   She also had drainage of  pericardial effusion. Followed by [facility].   Since she has developed a "high fever" again (subjective), intense fatigue and weakness, "I have slept more over the last month". She is still having sharp pains over the heart area as well. Denies SOB, DOE, edema.    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/17/2014   Depression 01/16/2014   GERD (gastroesophageal reflux disease) 01/16/2014   COPD (chronic obstructive pulmonary disease) ([place]) 01/16/2014   Severe tricuspid regurgitation 05/24/2013   Tobacco abuse 04/16/2013    Allergies:  Allergies  Allergen Reactions   Amoxicillin Diarrhea and Other (See Comments)    " I get very sick with this medication"   Latex Rash    " I'm allergic to latex, and it breaks by skin out in an itchy  rash"   Penicillins Rash   Medications:  Current Outpatient Medications:    Ascorbic Acid (VITAMIN C) 1000 MG tablet, , Disp: , Rfl:    diazepam (VALIUM) 10 MG tablet, Take 10 mg by mouth 2 (two) times daily as needed for anxiety., Disp: , Rfl:    famotidine (PEPCID) 20 MG tablet, Take 20 mg by mouth daily., Disp: , Rfl:    ibuprofen (ADVIL,MOTRIN) 800 MG tablet, Take 1 tablet by mouth every 8 (eight) hours as needed for mild pain. , Disp: , Rfl:    ipratropium (ATROVENT HFA) 17 MCG/ACT inhaler, Inhale 2 puffs into the lungs every 6 (six) hours as needed for wheezing., Disp: , Rfl:    mirabegron ER (MYRBETRIQ) 25 MG TB24 tablet, Take 1 tablet (25 mg total) by mouth daily., Disp: 30 tablet, Rfl: 5   Multiple Vitamins-Calcium (ONE-A-DAY WOMENS FORMULA PO), Take 1 tablet by mouth daily., Disp: , Rfl:    Omega-3 Fatty Acids (FISH OIL) 1000 MG CAPS, Take 3,000 mg by mouth daily. , Disp: , Rfl:    potassium gluconate 595 MG TABS tablet, Take 1 tablet (595 mg total) by mouth every other day., Disp: 90 tablet, Rfl: 3   rosuvastatin (CRESTOR) 20 MG tablet, Take 1 tablet (20 mg total) by mouth daily., Disp: 90 tablet, Rfl: 3   sertraline (ZOLOFT) 100 MG tablet, Take 1 tablet (100 mg total) by mouth daily., Disp: 30 tablet, Rfl: 5  Observations/Objective: Patient is well-developed, well-nourished in no acute distress.  Resting comfortably at home.  Head is normocephalic, atraumatic.  No labored breathing.  Speech is clear and coherent with logical content.  Patient is alert and oriented at baseline.    Assessment and Plan: 1. Fever, unspecified fever cause  2. Left-sided chest pain  - Patient high risk for pneumonia, PE, and cardiac issue - Advised to seek in person evaluation ASAP at local UC or ER - Patient agrees to be seen in person - Call 9-1-1 if left side chest pain is persistent or associated with SOB, DOE, edema, nausea/vomiting, dizziness, syncope  Follow Up Instructions: I  discussed the assessment and treatment plan with the patient. The patient was provided an opportunity to ask questions and all were answered. The patient agreed with the plan and demonstrated an understanding of the instructions.  A copy of instructions were sent to the patient via MyChart unless otherwise noted below.   The patient was advised to call back or seek an in-person evaluation if the symptoms worsen or if the condition fails to improve as anticipated.  Time:  I spent 13 minutes with the patient via telehealth technology discussing the above problems/concerns.    Mar Daring, PA-C

## 2021-11-16 NOTE — ED Provider Notes (Signed)
[facility]    CSN: [id number] Arrival date & time: 11/16/21  1856      History   Chief Complaint Chief Complaint  Patient presents with   URI    HPI Tina Fitzgerald is a 51 y.o. female here for evaluation of URI symptoms.   URI Presenting symptoms: congestion, fatigue, fever and rhinorrhea   Presenting symptoms: no cough and no sore throat   Associated symptoms: headaches   Associated symptoms: no wheezing    Received flu shot and shingles vaccination on 10/29/21. She states about 2 weeks ago she began feeling somewhat ill with runny nose, sinus pressure, sore throat. She was then taking care of son - who had sore throat, runny nose. He got sick about 5 days ago.   Her symptoms then re-started about 3 days ago.  She thought that she improved from may be 2 days, before her symptoms recurred.  Symptoms:  - subjective fever and chills - headache -Sinus pressure and congestion - no sore throat - runny nose - reports some shortness of breath, no chest pain. No cough  - no abdominal pain, N/V/D - one brief episode of left sided chest pain about 20-30 secs yesterday, none today.  Ports history of endocarditis with severe tricuspid regurgitation and history of cardiac tamponade.     Past Medical History:  Diagnosis Date   Anxiety    ARF (acute renal failure) (HCC)    a. 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   Arthritis    "knees" (05/09/2013)   Asthma    Cardiac arrest (HCC)    a. 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   Cardiac tamponade    a. s/p subxiphoid window 2007 in setting of TV endocarditis. b. She had suffered a mild anoxic injury during her recent hospitalization felt to be related to hypoperfusion /notes 06/15/2006 (05/09/2013)   Chronic headaches    Depression    Endocarditis    a. TV endocarditis 2007 - prolonged hospitalization requiring surgical debridement of the TV with  asystolic cardiac arrest, pericardial tamponade s/p subxiphoid window and PFO closure surgically at that time, along with septic pulmonary emboli, respiratory failure, and renal failure requiring short duration of dialysis at that time.   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 s/p closure in 2007.   Septic pulmonary embolism (HCC)    a. During admission for TV endocarditis.   Severe tricuspid regurgitation    a. By TEE/cath 04/2013 - for outpt referral to [facility].    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 04/17/2014   Depression 01/16/2014   GERD (gastroesophageal reflux disease) 01/16/2014   COPD (chronic obstructive pulmonary disease) (HCC) 01/16/2014   Severe tricuspid regurgitation 05/24/2013   Tobacco abuse 04/16/2013    Past Surgical History:  Procedure Laterality Date   CARDIAC CATHETERIZATION  multiple/notes 06/15/2006 (05/09/2013)   DILATION AND CURETTAGE OF UTERUS     "couple of those years ago; one a couple years ago after I lost a pregnancy" (05/09/2013)   LEFT AND RIGHT HEART CATHETERIZATION WITH CORONARY ANGIOGRAM N/A 05/08/2013   Procedure: LEFT AND RIGHT HEART CATHETERIZATION WITH CORONARY ANGIOGRAM;  Surgeon: Burnell Blanks, MD;  Location: [facility] CATH LAB;  Service: Cardiovascular;  Laterality: N/A;   PATENT FORAMEN OVALE CLOSURE  04/22/2006   closed during tricuspid valve surgery   SUBXYPHOID PERICARDIAL WINDOW  05/07/2006   for  large postoperative pericardial effusion    TEE WITHOUT CARDIOVERSION N/A 05/08/2013   Procedure: TRANSESOPHAGEAL ECHOCARDIOGRAM (TEE);  Surgeon: Lelon Perla, MD;  Location: [facility] ENDOSCOPY;  Service: Cardiovascular;  Laterality: N/A;   TRICUSPID VALVE SURGERY  04/22/2006   debridement of septal leaflet for MSSA bacterial endocarditis with closure PFO    OB History     Gravida  9   Para  5   Term  5   Preterm      AB  3   Living  6      SAB  1   IAB      Ectopic      Multiple  1   Live Births  6            Home Medications    Prior to Admission medications   Medication Sig Start Date End Date Taking? Authorizing Provider  azithromycin (ZITHROMAX) 250 MG tablet Take 1 tablet (250 mg total) by mouth daily. Take first 2 tablets together, then 1 every day until finished. 11/16/21  Yes Elba Barman, DO  Ascorbic Acid (VITAMIN C) 1000 MG tablet  04/01/20   [provider]  diazepam (VALIUM) 10 MG tablet Take 10 mg by mouth 2 (two) times daily as needed for anxiety.    [provider]  famotidine (PEPCID) 20 MG tablet Take 20 mg by mouth daily.    [provider]  ibuprofen (ADVIL,MOTRIN) 800 MG tablet Take 1 tablet by mouth every 8 (eight) hours as needed for mild pain.  09/24/15   [provider]  ipratropium (ATROVENT HFA) 17 MCG/ACT inhaler Inhale 2 puffs into the lungs every 6 (six) hours as needed for wheezing.    [provider]  mirabegron ER (MYRBETRIQ) 25 MG TB24 tablet Take 1 tablet (25 mg total) by mouth daily. 05/20/21   Jaquita Folds, MD  Multiple Vitamins-Calcium (ONE-A-DAY WOMENS FORMULA PO) Take 1 tablet by mouth daily.    [provider]  Omega-3 Fatty Acids (FISH OIL) 1000 MG CAPS Take 3,000 mg by mouth daily.     [provider]  potassium gluconate 595 MG TABS tablet Take 1 tablet (595 mg total) by mouth every other day. 02/13/14   Rowe Clack, MD  rosuvastatin (CRESTOR) 20 MG  tablet Take 1 tablet (20 mg total) by mouth daily. 11/26/19   Fay Records, MD  sertraline (ZOLOFT) 100 MG tablet Take 1 tablet (100 mg total) by mouth daily. 01/16/14   Kyra Leyland, PA-C  SPIRIVA HANDIHALER 18 MCG inhalation capsule 1 capsule daily. 10/23/21   [provider]    Family History Family History  Problem Relation Age of Onset   Hyperlipidemia Mother    Hyperlipidemia Brother    Alcohol abuse Father    Arthritis Father    Drug abuse Father    Heart disease Maternal Grandmother    Diabetes Daughter    Ovarian  cancer Cousin    Mental illness Other    Cancer Neg Hx    Early death Neg Hx    Hypertension Neg Hx    Kidney disease Neg Hx    Learning disabilities Neg Hx    Stroke Neg Hx     Social History Social History   Tobacco Use   Smoking status: Every Day    Packs/day: 0.75    Years: 30.00    Pack years: 22.50    Types: Cigarettes   Smokeless tobacco: Never  Vaping Use   Vaping Use: Never used  Substance Use Topics   Alcohol use: Yes    Alcohol/week: 7.0 standard drinks    Types: 7 Cans of beer per week   Drug use: Yes    Types: Marijuana, Heroin, "Crack" cocaine, Cocaine    Comment: 05/09/2013 "<1 joint/day; last heroin & crack was before heart OR in 2007"     Allergies   Amoxicillin, Latex, and Penicillins   Review of Systems Review of Systems  Constitutional:  Positive for appetite change, chills, fatigue and fever.  HENT:  Positive for congestion, rhinorrhea and sinus pressure. Negative for sore throat.   Respiratory:  Negative for cough, chest tightness and wheezing.   Cardiovascular:  Positive for chest pain (brief transient - now improved).  Gastrointestinal:  Negative for abdominal pain, diarrhea, nausea and vomiting.  Skin:  Negative for rash.  Neurological:  Positive for headaches. Negative for dizziness and weakness.    Physical Exam Triage Vital Signs ED Triage Vitals  Enc Vitals Group     BP 11/16/21 1920 111/79      Pulse Rate 11/16/21 1920 87     Resp 11/16/21 1920 (!) 36     Temp 11/16/21 1920 98.3 F (36.8 C)     Temp Source 11/16/21 1920 Oral     SpO2 11/16/21 1920 96 %     Weight --      Height --      Head Circumference --      Peak Flow --      Pain Score 11/16/21 1917 3     Pain Loc --      Pain Edu? --      Excl. in GC? --    No data found.  Updated Vital Signs BP 111/79 (BP Location: Left Arm)    Pulse 87    Temp 98.3 F (36.8 C) (Oral)    Resp (!) 36    SpO2 96%    Physical Exam Constitutional:      Appearance: Normal appearance. She is not ill-appearing, toxic-appearing or diaphoretic.  HENT:     Head: Normocephalic and atraumatic.     Comments: Sinus congestion, mild bogginess in maxillary sinuses    Right Ear: Tympanic membrane normal.     Left Ear: Tympanic membrane normal.     Nose: Congestion and rhinorrhea present.     Mouth/Throat:     Mouth: Mucous membranes are moist.     Pharynx: No oropharyngeal exudate.  Eyes:     Extraocular Movements: Extraocular movements intact.     Pupils: Pupils are equal, round, and reactive to light.  Cardiovascular:     Rate and Rhythm: Normal rate.     Heart sounds: Normal heart sounds.  Pulmonary:     Effort: Pulmonary effort is normal. No respiratory distress.     Breath sounds: No wheezing, rhonchi or rales.  Abdominal:     General: Abdomen is flat.  Palpations: Abdomen is soft.  Lymphadenopathy:     Cervical: Cervical adenopathy present.  Skin:    Capillary Refill: Capillary refill takes less than 2 seconds.  Neurological:     General: No focal deficit present.     Mental Status: She is alert.  Psychiatric:        Mood and Affect: Mood normal.     UC Treatments / Results  Labs (all labs ordered are listed, but only abnormal results are displayed) Labs Reviewed - No data to display  EKG - NSR, no ST-changes or elevation  Radiology No results found.  Procedures Procedures (including critical care  time)  Medications Ordered in UC Medications - No data to display  Initial Impression / Assessment and Plan / UC Course  I have reviewed the triage vital signs and the nursing notes.  Pertinent labs & imaging results that were available during my care of the patient were reviewed by me and considered in my medical decision making (see chart for details).  Clinical Course as of 11/16/21 2107  Mon Nov 16, 2021  2002 Resp(!): 36 Repeated Resp Rate: 19 (DWB) [DB]    Clinical Course User Index [DB] Elba Barman, DO    URI Sinusitis, subacute Double sickening syndrome  Patient with signs and symptoms of URI as well as sinusitis.  She had symptoms of subjective fever and chills, sinus pressure, nasal congestion, and some mild shortness of breath over 2 weeks ago, had 1 to 2 days of feeling better, before her symptoms worsened again.  Questionable sick contact in her son.  She did have 1 brief stent of left-sided chest pain, so an EKG was performed which showed normal sinus rhythm without any ST changes present.  She is nontoxic on exam, although I am somewhat concerned about her double sickening symptoms that this could be a bacterial etiology on top of her previous viral URI-like symptoms.  It is also possible that she has a degree of atypical pneumonia, although her lung exam does not demonstrate any rales, rhonchi or wheezing.  Given this, we will treat with 5 days of azithromycin.  Discussed supportive treatment as well.  Strict return precautions were provided if she does not improve over the next few days.  She is safe for discharge home.  Final Clinical Impressions(s) / UC Diagnoses   Final diagnoses:  Upper respiratory tract infection, unspecified type  Subacute sinusitis, unspecified location     Discharge Instructions      Complete antibiotic for 5 days  If symptoms or not improving, or you are having fevers after this, would recommend following up with your PCP     ED  Prescriptions     Medication Sig Dispense Auth. Provider   azithromycin (ZITHROMAX) 250 MG tablet Take 1 tablet (250 mg total) by mouth daily. Take first 2 tablets together, then 1 every day until finished. 6 tablet Elba Barman, DO      PDMP not reviewed this encounter.   Elba Barman, DO 11/16/21 2110

## 2021-12-02 ENCOUNTER — Ambulatory Visit: Payer: Medicare Other | Admitting: Physician Assistant

## 2021-12-21 ENCOUNTER — Other Ambulatory Visit: Payer: Self-pay | Admitting: Obstetrics and Gynecology

## 2021-12-21 DIAGNOSIS — N3281 Overactive bladder: Secondary | ICD-10-CM

## 2021-12-23 ENCOUNTER — Ambulatory Visit: Payer: Medicare Other | Admitting: Adult Health

## 2022-01-05 ENCOUNTER — Ambulatory Visit: Payer: Medicare Other | Admitting: Nurse Practitioner

## 2022-01-11 ENCOUNTER — Ambulatory Visit: Payer: Medicare Other | Admitting: Adult Health

## 2022-01-15 ENCOUNTER — Other Ambulatory Visit: Payer: Self-pay

## 2022-01-15 ENCOUNTER — Encounter: Payer: Self-pay | Admitting: Nurse Practitioner

## 2022-01-15 ENCOUNTER — Ambulatory Visit (INDEPENDENT_AMBULATORY_CARE_PROVIDER_SITE_OTHER): Payer: Medicare Other | Admitting: Nurse Practitioner

## 2022-01-15 VITALS — BP 134/88 | HR 91 | Temp 96.0°F | Resp 18 | Ht 65.0 in | Wt 190.6 lb

## 2022-01-15 DIAGNOSIS — I071 Rheumatic tricuspid insufficiency: Secondary | ICD-10-CM | POA: Diagnosis not present

## 2022-01-15 DIAGNOSIS — R7309 Other abnormal glucose: Secondary | ICD-10-CM

## 2022-01-15 DIAGNOSIS — K219 Gastro-esophageal reflux disease without esophagitis: Secondary | ICD-10-CM | POA: Diagnosis not present

## 2022-01-15 DIAGNOSIS — Z720 Tobacco use: Secondary | ICD-10-CM | POA: Diagnosis not present

## 2022-01-15 DIAGNOSIS — F320 Major depressive disorder, single episode, mild: Secondary | ICD-10-CM | POA: Diagnosis not present

## 2022-01-15 DIAGNOSIS — E785 Hyperlipidemia, unspecified: Secondary | ICD-10-CM

## 2022-01-15 DIAGNOSIS — J449 Chronic obstructive pulmonary disease, unspecified: Secondary | ICD-10-CM

## 2022-01-15 DIAGNOSIS — B182 Chronic viral hepatitis C: Secondary | ICD-10-CM

## 2022-01-15 DIAGNOSIS — F321 Major depressive disorder, single episode, moderate: Secondary | ICD-10-CM

## 2022-01-15 LAB — COMPREHENSIVE METABOLIC PANEL
ALT: 19 U/L (ref 0–35)
AST: 18 U/L (ref 0–37)
Albumin: 4.3 g/dL (ref 3.5–5.2)
Alkaline Phosphatase: 74 U/L (ref 39–117)
BUN: 26 mg/dL — ABNORMAL HIGH (ref 6–23)
CO2: 28 mEq/L (ref 19–32)
Calcium: 9.6 mg/dL (ref 8.4–10.5)
Chloride: 104 mEq/L (ref 96–112)
Creatinine, Ser: 0.87 mg/dL (ref 0.40–1.20)
GFR: 77.39 mL/min (ref >60.00)
Glucose, Bld: 94 mg/dL (ref 70–99)
Potassium: 4.8 mEq/L (ref 3.5–5.1)
Sodium: 139 mEq/L (ref 135–145)
Total Bilirubin: 0.6 mg/dL (ref 0.2–1.2)
Total Protein: 6.9 g/dL (ref 6.0–8.3)

## 2022-01-15 LAB — LIPID PANEL
Cholesterol: 115 mg/dL (ref 0–200)
HDL: 44 mg/dL (ref >39.00)
LDL Cholesterol: 53 mg/dL (ref 0–99)
NonHDL: 70.83
Total CHOL/HDL Ratio: 3
Triglycerides: 91 mg/dL (ref 0.0–149.0)
VLDL: 18.2 mg/dL (ref 0.0–40.0)

## 2022-01-15 LAB — CBC
HCT: 46.5 % — ABNORMAL HIGH (ref 36.0–46.0)
Hemoglobin: 15.5 g/dL — ABNORMAL HIGH (ref 12.0–15.0)
MCHC: 33.4 g/dL (ref 30.0–36.0)
MCV: 93.7 fl (ref 78.0–100.0)
Platelets: 203 10*3/uL (ref 150.0–400.0)
RBC: 4.96 Mil/uL (ref 3.87–5.11)
RDW: 14.4 % (ref 11.5–15.5)
WBC: 7.3 10*3/uL (ref 4.0–10.5)

## 2022-01-15 LAB — HEMOGLOBIN A1C: Hgb A1c MFr Bld: 5.7 % (ref 4.6–6.5)

## 2022-01-15 LAB — TSH: TSH: 1.3 u[IU]/mL (ref 0.35–5.50)

## 2022-01-15 MED ORDER — SERTRALINE HCL 100 MG PO TABS: 150.0000 mg | ORAL_TABLET | Freq: Every day | ORAL | 5 refills | Status: DC

## 2022-01-15 NOTE — Assessment & Plan Note (Signed)
Patient states that she tested negative for hep C upon repeat evaluation. ?

## 2022-01-15 NOTE — Assessment & Plan Note (Signed)
Currently managed on famotidine.  Continue ?

## 2022-01-15 NOTE — Assessment & Plan Note (Signed)
Smoking in the past using the 1 800 quit now hotline and using nicotine patches from them.  Patient states she called them again to ask for help with any of the letter from her PCP stating that she is okay to use nicotine patches.  Letter provided that after visit with patient.  Encourage patient to stop smoking and this will benefit her lungs and function.  Continue working with 100 quit now ?

## 2022-01-15 NOTE — Patient Instructions (Signed)
Nice to see you today ?I will be in touch with the lab results once I have them ?Follow up with me in 3 months, sooner if you need me ? ?Stop up front to sign the forms they need you to sign ? ?I changed the sertraline (zoloft) from 1 tablet to 1.5 tablets a day (total of 150mg  daily) ?

## 2022-01-15 NOTE — Assessment & Plan Note (Signed)
Patient currently maintained on Atrovent and Spiriva.  States she uses Atrovent several times a day but upon further conversation seems like because she felt this when she was requested.  Did tell her that is for as needed use only.  Did offer to put her on albuterol but she states albuterol gives her unwanted side effects and she cannot tolerate it.  DC this is a previous note from previous provider.  If she continues or has shortness of breath we discussed about stepping up to dual therapy of an ICS/LABA.  Patient's somewhat hesitant because of long-acting albuterol.  Continue Atrovent and Spiriva as prescribed ?

## 2022-01-15 NOTE — Progress Notes (Signed)
? ?New Patient Office Visit ? ?Subjective:  ?Patient ID: Tina Fitzgerald, female    DOB: 10-Apr-1971  Age: 52 y.o. MRN: [id number] ? ?CC:  ?Chief Complaint  ?Patient presents with  ? Establish Care  ?  [facility] on [address] for PCP  ? ? ?HPI ?Tina Fitzgerald presents for establish care ? ?COPD: Spriva daily and states that she has an albuterol inhaler that she uses as needed. Last time that she needed it was. Last use was last night states that she normally uses it three times daily. everyday ?No nighttime awakenings with gasping or shortness of breath. ? ?Tricuspid reg: Caridology. 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 States last use was 2007. Dr Dorris Carnes is patients cardiologist along with Dr. Lauree Chandler. ? ?GERD: controlled on pepcid  ? ?Hep C: Tested positive per patient. States that she was restated and it was negative. Never had treatment in the past per her report ? ?Depression: currently maintained on zofloft 100mg  dialy. States that she is dealing with her daughter and drug abuse, heroin. No si/Hi/AVH. Has not done therapy in the past. States she is interested in it ? ?Smoking: States that she had stopped and recently started back with about 7 cigs a day. States she has used 1-800-quitnow and they helped her in the past.  ? ? ?Past Medical History:  ?Diagnosis Date  ? Anxiety   ? ARF (acute renal failure) ([place])   ? a. 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  ? Arthritis   ? "knees" (05/09/2013)  ? Asthma   ? Cardiac arrest [facility])   ? a. Asystolic arrest [id number] in setting of TV endocarditis, pericardial tamponade.  ? Cardiac tamponade   ? a. s/p subxiphoid window 2007 in setting of TV endocarditis. b. She had suffered a mild anoxic injury during her recent hospitalization felt to be related to hypoperfusion /notes 06/15/2006 (05/09/2013)  ? Chronic headaches   ? Depression   ? Endocarditis   ? a. TV  endocarditis 2007 - prolonged hospitalization requiring surgical debridement of the TV with asystolic cardiac arrest, pericardial tamponade s/p subxiphoid window and PFO closure surgically at that time, along with septic pulmonary emboli, respiratory failure, and renal failure requiring short duration of dialysis at that time.  ? GERD (gastroesophageal reflux disease)   ? Hepatitis B carrier ([place])   ? Hepatitis C   ? History of tricuspid valve disorder 04/16/2013  ? Hx: UTI (urinary tract infection)   ? Memory loss, short term   ? "since OHS in 2007" (05/09/2013)  ? PFO (patent foramen ovale)   ? a. s/p closure in 2007.  ? Septic pulmonary embolism ([place])   ? a. During admission for TV endocarditis.  ? Severe tricuspid regurgitation   ? a. By TEE/cath 04/2013 - for outpt referral to [facility].   ? Tobacco abuse   ? ? ?Past Surgical History:  ?Procedure Laterality Date  ? CARDIAC CATHETERIZATION    ? multiple/notes 06/15/2006 (05/09/2013)  ? DILATION AND CURETTAGE OF UTERUS    ? "couple of those years ago; one a couple years ago after I lost a pregnancy" (05/09/2013)  ? LEFT AND RIGHT HEART CATHETERIZATION WITH CORONARY ANGIOGRAM N/A 05/08/2013  ? Procedure: LEFT AND RIGHT HEART CATHETERIZATION WITH CORONARY ANGIOGRAM;  Surgeon: Burnell Blanks, MD;  Location: [facility] CATH LAB;  Service: Cardiovascular;  Laterality: N/A;  ? PATENT  FORAMEN OVALE CLOSURE  04/22/2006  ? closed during tricuspid valve surgery  ? SUBXYPHOID PERICARDIAL WINDOW  05/07/2006  ? for large postoperative pericardial effusion   ? TEE WITHOUT CARDIOVERSION N/A 05/08/2013  ? Procedure: TRANSESOPHAGEAL ECHOCARDIOGRAM (TEE);  Surgeon: Lelon Perla, MD;  Location: [facility] ENDOSCOPY;  Service: Cardiovascular;  Laterality: N/A;  ? TRICUSPID VALVE SURGERY  04/22/2006  ? debridement of septal leaflet for MSSA bacterial endocarditis with closure PFO  ? ? ?Family History  ?Problem Relation Age of Onset  ? Hyperlipidemia Mother   ? Hyperlipidemia Brother   ? Alcohol abuse Father    ? Arthritis Father   ? Drug abuse Father   ? Heart disease Maternal Grandmother   ? Diabetes Daughter   ? Ovarian cancer Cousin   ? Mental illness Other   ? Cancer Neg Hx   ? Early death Neg Hx   ? Hypertension Neg Hx   ? Kidney disease Neg Hx   ? Learning disabilities Neg Hx   ? Stroke Neg Hx   ? ? ?Social History  ? ?Socioeconomic History  ? Marital status: Divorced  ?  Spouse name: Not on file  ? Number of children: 6  ? Years of education: Not on file  ? Highest education level: Not on file  ?Occupational History  ? Occupation: disabled  ?Tobacco Use  ? Smoking status: Every Day  ?  Packs/day: 0.75  ?  Years: 30.00  ?  Pack years: 22.50  ?  Types: Cigarettes  ? Smokeless tobacco: Never  ?Vaping Use  ? Vaping Use: Never used  ?Substance and Sexual Activity  ? Alcohol use: Yes  ?  Alcohol/week: 7.0 standard drinks  ?  Types: 7 Cans of beer per week  ? Drug use: Yes  ?  Types: Marijuana, Heroin, "Crack" cocaine, Cocaine  ?  Comment: 05/09/2013 "<1 joint/day; last heroin & crack was before heart OR in 2007"  ? Sexual activity: Yes  ?  Birth control/protection: Condom  ?Other Topics Concern  ? Not on file  ?Social History Narrative  ? Not on file  ? ?Social Determinants of Health  ? ?Financial Resource Strain: Not on file  ?Food Insecurity: Not on file  ?Transportation Needs: Not on file  ?Physical Activity: Not on file  ?Stress: Not on file  ?Social Connections: Not on file  ?Intimate Partner Violence: Not on file  ? ? ?ROS ?Review of Systems  ?Constitutional:  Negative for chills and fever.  ?Respiratory:  Positive for shortness of breath. Negative for cough.   ?Cardiovascular:  Negative for chest pain.  ?Gastrointestinal:  Negative for diarrhea, nausea and vomiting.  ?     BM daily  ?Genitourinary:  Negative for difficulty urinating.  ?Neurological:  Positive for headaches.  ?Psychiatric/Behavioral:  Negative for hallucinations and suicidal ideas.   ? ?Objective:  ? ?Today's Vitals: BP 134/88   Pulse 91   Temp  (!) 96 ?F (35.6 ?C)   Resp 18   Ht 5\' 5"  (1.651 m)   Wt 190 lb 9 oz (86.4 kg)   SpO2 97%   BMI 31.71 kg/m?  ? ?Physical Exam ?HENT:  ?   Right Ear: Tympanic membrane, ear canal and external ear normal.  ?   Left Ear: Tympanic membrane, ear canal and external ear normal.  ?Cardiovascular:  ?   Rate and Rhythm: Normal rate and regular rhythm.  ?Pulmonary:  ?   Effort: Pulmonary effort is normal.  ?   Breath sounds: Normal  breath sounds.  ?Abdominal:  ?   General: Bowel sounds are normal. There is no distension.  ?   Palpations: There is no mass.  ?   Tenderness: There is no abdominal tenderness.  ?   Hernia: No hernia is present.  ?Musculoskeletal:  ?   Right lower leg: No edema.  ?   Left lower leg: No edema.  ?Neurological:  ?   General: No focal deficit present.  ?   Comments: Bilateral upper and lower extremity strength 5/5  ? ? ?Assessment & Plan:  ? ?Problem List Items Addressed This Visit   ? ?  ? Cardiovascular and Mediastinum  ? Severe tricuspid regurgitation (Chronic)  ?  Currently managed and maintained by Dr. Dorris Carnes Dr. Lauree Chandler from cardiology.  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  Patient also had a history of cardiac tamponade with window placed in 2007 ?  ?  ? Relevant Medications  ? aspirin EC 81 MG tablet  ?  ? Respiratory  ? COPD (chronic obstructive pulmonary disease) ([place])  ?  Patient currently maintained on Atrovent and Spiriva.  States she uses Atrovent several times a day but upon further conversation seems like because she felt this when she was requested.  Did tell her that is for as needed use only.  Did offer to put her on albuterol but she states albuterol gives her unwanted side effects and she cannot tolerate it.  DC this is a previous note from previous provider.  If she continues or has shortness of breath we discussed about stepping up to dual therapy of an ICS/LABA.  Patient's somewhat hesitant because of long-acting albuterol.  Continue Atrovent and  Spiriva as prescribed ?  ?  ? Relevant Orders  ? CBC  ? Comprehensive metabolic panel  ?  ? Digestive  ? GERD (gastroesophageal reflux disease)  ?  Currently managed on famotidine.  Continue ?  ?  ? Hep C w/o coma, chro

## 2022-01-15 NOTE — Assessment & Plan Note (Signed)
Currently maintained on sertraline 100 mg daily.  Did administer PHQ-9 and GAD-7 in office.  Patient denies SI/HI/AVH.  Given patient's current situation with depression and anxiety we will refer patient to therapy and increase her sertraline from 100 mg daily to 150 mg daily.  Discussed this with patient she is on board she can take 1-1/2 tablets of medication she currently has at home. ?

## 2022-01-15 NOTE — Assessment & Plan Note (Signed)
Currently managed and maintained by Dr. Dietrich Pates Dr. Verne Carrow from cardiology.  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  Patient also had a history of cardiac tamponade with window placed in 2007 ?

## 2022-01-18 ENCOUNTER — Encounter: Payer: Self-pay | Admitting: Nurse Practitioner

## 2022-01-20 ENCOUNTER — Encounter: Payer: Self-pay | Admitting: Nurse Practitioner

## 2022-01-20 DIAGNOSIS — F419 Anxiety disorder, unspecified: Secondary | ICD-10-CM

## 2022-01-21 ENCOUNTER — Other Ambulatory Visit: Payer: Self-pay | Admitting: Nurse Practitioner

## 2022-01-21 DIAGNOSIS — F419 Anxiety disorder, unspecified: Secondary | ICD-10-CM

## 2022-01-21 MED ORDER — DIAZEPAM 10 MG PO TABS: 10.0000 mg | ORAL_TABLET | Freq: Two times a day (BID) | ORAL | 0 refills | Status: DC | PRN

## 2022-01-21 NOTE — Telephone Encounter (Signed)
I will print off the script ?

## 2022-01-28 ENCOUNTER — Other Ambulatory Visit: Payer: Self-pay | Admitting: Obstetrics & Gynecology

## 2022-01-28 DIAGNOSIS — Z1231 Encounter for screening mammogram for malignant neoplasm of breast: Secondary | ICD-10-CM

## 2022-02-03 ENCOUNTER — Ambulatory Visit: Payer: Medicare Other

## 2022-02-04 ENCOUNTER — Ambulatory Visit
Admission: RE | Admit: 2022-02-04 | Discharge: 2022-02-04 | Disposition: A | Discharge status: Home / Self Care | Payer: Medicare Other | Source: Ambulatory Visit | Attending: Obstetrics & Gynecology | Admitting: Obstetrics & Gynecology

## 2022-02-04 DIAGNOSIS — Z1231 Encounter for screening mammogram for malignant neoplasm of breast: Secondary | ICD-10-CM

## 2022-02-04 LAB — HM MAMMOGRAPHY

## 2022-02-17 ENCOUNTER — Ambulatory Visit: Payer: Medicare Other | Admitting: Physician Assistant

## 2022-02-17 DIAGNOSIS — E7800 Pure hypercholesterolemia, unspecified: Secondary | ICD-10-CM | POA: Insufficient documentation

## 2022-02-17 NOTE — Progress Notes (Deleted)
Cardiology Office Note:    Date:  02/17/2022   ID:  Tina Fitzgerald, DOB January 18, 1971, MRN [id number]  PCP:  Eden Emms, NP  [facility] Providers Cardiologist:  Dietrich Pates, MD { Click to update primary MD,subspecialty MD or APP then REFRESH:1}  *** Referring MD: Rometta Emery, MD   Chief Complaint:  No chief complaint on file. {Click here for Visit Info    :1}   Patient Profile: Tricuspid regurgitation 2/2 endocarditis S/p surgical debridement of TV, Patent Foramen Ovale closure in 2007 TEE in 2014: severe TR >> saw Dr. Cornelius Moras - due to ongoing infections (UTI, etc) med Rx recommended  Hyperlipidemia  Hep C, Hep B Hx of IVDA Hx of pulmonary embolism  Hx of MSSA endocarditis in 2007 (c/b cardiac tamponade s/p drainage) Chronic Obstructive Pulmonary Disease GERD  Patent Foramen Ovale s/p closure during TV debridement in 2007 +Cigs   Prior CV Studies: Echocardiogram 12/19/2020 EF 60-65, no RWMA, evidence of RV volume overload, moderately elevated PASP, RVSP 45, severe RAE, severe TR  Cardiac catheterization 05/08/2013 No CAD EF 45 {Select studies to display:26339}   History of Present Illness:   Tina Fitzgerald is a 51 y.o. female with the above problem list.  She was last seen by Dr. Tenny Craw in Feb 2022.  Her TR was felt to be stable and continued med Rx was recommended.  ***    Past Medical History:  Diagnosis Date   Anxiety    ARF (acute renal failure) (HCC)    a. 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   Arthritis    "knees" (05/09/2013)   Asthma    Cardiac arrest (HCC)    a. 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   Cardiac tamponade    a. s/p subxiphoid window 2007 in setting of TV endocarditis. b. She had suffered a mild anoxic injury during her recent hospitalization felt to be related to hypoperfusion /notes 06/15/2006 (05/09/2013)   Chronic headaches    Depression    Endocarditis    a. TV  endocarditis 2007 - prolonged hospitalization requiring surgical debridement of the TV with asystolic cardiac arrest, pericardial tamponade s/p subxiphoid window and PFO closure surgically at that time, along with septic pulmonary emboli, respiratory failure, and renal failure requiring short duration of dialysis at that time.   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 s/p closure in 2007.   Septic pulmonary embolism (HCC)    a. During admission for TV endocarditis.   Severe tricuspid regurgitation    a. By TEE/cath 04/2013 - for outpt referral to [facility].    Tobacco abuse    Current Medications: No outpatient medications have been marked as taking for the 02/17/22 encounter (Appointment) with Tereso Newcomer T, PA-C.    Allergies:   Amoxicillin, Latex, and Penicillins   Social History   Tobacco Use   Smoking status: Every Day    Packs/day: 0.75    Years: 30.00    Pack years: 22.50    Types: Cigarettes   Smokeless tobacco: Never  Vaping Use  Vaping Use: Never used  Substance Use Topics   Alcohol use: Yes    Alcohol/week: 7.0 standard drinks    Types: 7 Cans of beer per week    Comment: at times has mixed drink   Drug use: Not Currently    Types: Marijuana, Heroin, "Crack" cocaine, Cocaine    Comment: 05/09/2013 "<1 joint/day; last heroin & crack was before heart OR in 2007"    Family Hx: The patient's family history includes Alcohol abuse in her daughter, father, maternal grandfather, and paternal grandfather; Alzheimer's disease in her maternal grandfather; Anxiety disorder in her daughter, daughter, daughter, son, and son; Arthritis in her father, mother, and paternal grandmother; Bipolar disorder in her  daughter; COPD in her mother; Depression in her daughter, daughter, daughter, maternal grandmother, mother, sister, and son; Drug abuse in her daughter, daughter, daughter, mother, sister, son, and son; Early death in her paternal grandfather; Heart disease in her maternal grandmother; Hyperlipidemia in her brother and mother; Ovarian cancer in her cousin; Schizophrenia in her daughter. There is no history of Cancer, Hypertension, Kidney disease, Learning disabilities, or Stroke.  ROS   EKGs/Labs/Other Test Reviewed:    EKG:  EKG is *** ordered today.  The ekg ordered today demonstrates ***  Recent Labs: 01/15/2022: ALT 19; BUN 26; Creatinine, Ser 0.87; Hemoglobin 15.5; Platelets 203.0; Potassium 4.8; Sodium 139; TSH 1.30   Recent Lipid Panel Recent Labs    01/15/22 1125  CHOL 115  TRIG 91.0  HDL 44.00  VLDL 18.2  LDLCALC 53     Risk Assessment/Calculations:   {Does this patient have ATRIAL FIBRILLATION?[phone]     Physical Exam:    VS:  LMP 03/23/2020     Wt Readings from Last 3 Encounters:  01/15/22 190 lb 9 oz (86.4 kg)  06/18/21 182 lb (82.6 kg)  05/20/21 177 lb (80.3 kg)    Physical Exam ***     ASSESSMENT & PLAN:   No problem-specific Assessment & Plan notes found for this encounter.        {Are you ordering a CV Procedure (e.g. stress test, cath, DCCV, TEE, etc)?   Press F2        :[id number]}  Dispo:  No follow-ups on file.   Medication Adjustments/Labs and Tests Ordered: Current medicines are reviewed at length with the patient today.  Concerns regarding medicines are outlined above.  Tests Ordered: No orders of the defined types were placed in this encounter.  Medication Changes: No orders of the defined types were placed in this encounter.  Signed, Tereso Newcomer, PA-C  02/17/2022 1:26 PM    [facility] [address] [place], [place], [place]  [address] Phone: [phone]; Fax: [phone]

## 2022-02-18 ENCOUNTER — Other Ambulatory Visit: Payer: Self-pay | Admitting: Nurse Practitioner

## 2022-02-18 ENCOUNTER — Other Ambulatory Visit: Payer: Self-pay | Admitting: Obstetrics and Gynecology

## 2022-02-18 ENCOUNTER — Other Ambulatory Visit: Payer: Self-pay | Admitting: Internal Medicine

## 2022-02-18 ENCOUNTER — Other Ambulatory Visit (HOSPITAL_COMMUNITY): Payer: Self-pay

## 2022-02-18 DIAGNOSIS — N3281 Overactive bladder: Secondary | ICD-10-CM

## 2022-02-18 DIAGNOSIS — F419 Anxiety disorder, unspecified: Secondary | ICD-10-CM

## 2022-02-18 DIAGNOSIS — F320 Major depressive disorder, single episode, mild: Secondary | ICD-10-CM

## 2022-02-18 MED ORDER — SPIRIVA HANDIHALER 18 MCG IN CAPS
1.0000 | ORAL_CAPSULE | Freq: Every day | RESPIRATORY_TRACT | 5 refills | Status: DC
  Filled 2022-02-18 – 2022-04-26 (×2): qty 30, 30d supply, fill #0
  Filled 2022-05-16: qty 30, 30d supply, fill #1
  Filled 2022-06-21: qty 30, 30d supply, fill #2

## 2022-02-18 MED ORDER — TIOTROPIUM BROMIDE MONOHYDRATE 18 MCG IN CAPS
ORAL_CAPSULE | RESPIRATORY_TRACT | 3 refills | Status: DC
  Filled 2022-02-18: qty 90, 90d supply, fill #0

## 2022-02-18 MED ORDER — ROSUVASTATIN CALCIUM 20 MG PO TABS
20.0000 mg | ORAL_TABLET | Freq: Every day | ORAL | 0 refills | Status: DC
  Filled 2022-02-18: qty 90, 90d supply, fill #0

## 2022-02-18 MED ORDER — DIAZEPAM 10 MG PO TABS
10.0000 mg | ORAL_TABLET | Freq: Two times a day (BID) | ORAL | 0 refills | Status: DC | PRN
  Filled 2022-02-18 – 2022-02-22 (×2): qty 60, 30d supply, fill #0

## 2022-02-18 MED ORDER — SERTRALINE HCL 100 MG PO TABS
150.0000 mg | ORAL_TABLET | Freq: Every day | ORAL | 5 refills | Status: DC
  Filled 2022-02-18: qty 45, 30d supply, fill #0
  Filled 2022-04-26: qty 45, 30d supply, fill #1
  Filled 2022-05-16: qty 45, 30d supply, fill #2
  Filled 2022-06-15: qty 45, 30d supply, fill #3
  Filled 2022-07-09: qty 45, 30d supply, fill #4

## 2022-02-19 ENCOUNTER — Other Ambulatory Visit (HOSPITAL_COMMUNITY): Payer: Self-pay

## 2022-02-22 ENCOUNTER — Other Ambulatory Visit (HOSPITAL_COMMUNITY): Payer: Self-pay

## 2022-02-22 NOTE — Progress Notes (Addendum)
Office Visit    Patient Name: Tina Fitzgerald Date of Encounter: 02/23/2022  Primary Care Provider:  Eden Emms, NP Primary Cardiologist:  Dietrich Pates, MD Primary Electrophysiologist: None Chief Complaint    1 year follow-up for tricuspid regurgitation   Patient Profile: Septic PE Tricuspid valve endocarditis with severe tricuspid regurgitation PFO s/p closure Cardiac arrest Pericardial effusion s/p window COPD Hepatitis C and hep B IVDA   Recent Studies: 2D echo 12/2019: EF 60-65%, no RWMA, severely dilated RA, moderate to severe tricuspid regurgitation 2D echo 12/2020: EF 60-65%, no RWMA, moderately dilated RV, moderately elevated PA systolic pressure, severely dilated RA R/LHC 05/2013: Revealed no evidence of CAD, mild global LV systolic dysfunction, and TV regurgitation  History of Present Illness    Tina Fitzgerald is a 51 y.o. 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 TEE completed in 2014 which showed wide-open TR and heart cath completed with no significant CAD.  She was seen by Dr. Cornelius Moras and non-surgical conservative management was recommended. Last echo completed 12/2020 with EF 60-65% and moderately elevated PA systolic pressure with severely dilated RA, moderately enlarged RV, moderately elevated PASP, and moderate to severe tricuspid regurg.  Tina Fitzgerald was last seen by Dr. Tenny Craw on 12/23/2020 for annual follow-up. During visit  patient stated that she was feeling good and had cut down to 2 cigarettes/day. She also stated that her breathing was good at the time.   Since being seen last year patient reports that she is feeling well and doing great from a cardiac perspective.  She is compliant with her medications and states that she quit smoking  for 2 months but is now back smoking 5 cigarettes/day.  She is not currently being followed by pulmonologist and states that she has difficulty driving due to increased panic attacks and declines referral at this time.  Patient denies chest pain, palpitations, dyspnea, PND, orthopnea, nausea, vomiting, dizziness, syncope, edema, weight gain, or early satiety    Past Medical History:  Diagnosis Date   Anxiety    ARF (acute renal failure) (HCC)    a. 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   Arthritis    "knees" (05/09/2013)   Asthma    Cardiac arrest (HCC)    a. 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   Cardiac tamponade    a. s/p subxiphoid window 2007 in setting of TV endocarditis. b. 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 s/p closure in 2007.   Septic pulmonary embolism (HCC)    a. During admission for TV endocarditis.   Severe tricuspid regurgitation    a. By TEE/cath 04/2013 - for outpt referral to [facility].    Tobacco abuse    Past Surgical History:  Procedure Laterality Date   CARDIAC CATHETERIZATION     multiple/notes  06/15/2006 (05/09/2013)   DILATION AND CURETTAGE OF UTERUS     "couple of those years ago; one a couple years ago after I lost a pregnancy" (05/09/2013)   LEFT AND RIGHT HEART CATHETERIZATION WITH CORONARY ANGIOGRAM N/A 05/08/2013   Procedure: LEFT AND RIGHT HEART CATHETERIZATION WITH CORONARY ANGIOGRAM;  Surgeon: Kathleene Hazel, MD;  Location: [facility] CATH LAB;  Service: Cardiovascular;  Laterality: N/A;   PATENT FORAMEN OVALE CLOSURE  04/22/2006   closed during tricuspid valve surgery   SUBXYPHOID PERICARDIAL WINDOW  05/07/2006   for large postoperative pericardial effusion    TEE WITHOUT CARDIOVERSION N/A 05/08/2013   Procedure: TRANSESOPHAGEAL ECHOCARDIOGRAM (TEE);  Surgeon: Lewayne Bunting, MD;  Location: [facility] ENDOSCOPY;  Service: Cardiovascular;  Laterality: N/A;   TRICUSPID VALVE SURGERY  04/22/2006   debridement of septal leaflet for MSSA bacterial endocarditis with closure PFO    Allergies  Allergies  Allergen Reactions   Amoxicillin Diarrhea and Other (See Comments)    " I get very sick with this medication"   Latex Rash    " I'm allergic to latex, and it breaks by skin out in an itchy rash"   Penicillins Rash    Home Medications    Current Outpatient Medications  Medication Sig Dispense Refill   Ascorbic Acid (VITAMIN C) 1000 MG tablet 2 daily     aspirin EC 81 MG tablet Take 81 mg by mouth daily. Swallow whole.     BIOTIN 5000 PO Take by mouth. 3 daily     Biotin w/ Vitamins C & E (HAIR/SKIN/NAILS PO) Take by mouth.     CALCIUM CITRATE PO Take by mouth. 2 daily     Calcium-Phosphorus-Vitamin D (CITRACAL +D3 PO) Take by mouth.     cyanocobalamin 1000 MCG tablet Take 1,000 mcg by mouth daily.     diazepam (VALIUM) 10 MG tablet Take 1 tablet (10 mg total) by mouth 2 (two) times daily as needed for anxiety. 60 tablet 0   diclofenac Sodium (VOLTAREN) 1 % GEL Apply topically 4 (four) times daily. As needed     famotidine (PEPCID) 20 MG tablet Take 20 mg by mouth daily.      ibuprofen (ADVIL,MOTRIN) 800 MG tablet Take 1 tablet by mouth every 8 (eight) hours as needed for mild pain.      ipratropium (ATROVENT HFA) 17 MCG/ACT inhaler Inhale 2 puffs into the lungs every 6 (six) hours as needed for wheezing.     mirabegron ER (MYRBETRIQ) 25 MG TB24 tablet Take 1 tablet (25 mg total) by mouth daily. 30 tablet 5   Multiple Vitamins-Calcium (ONE-A-DAY WOMENS FORMULA PO) Take 1 tablet by mouth daily.     Omega-3 Fatty Acids (FISH OIL) 1000 MG CAPS Take 3,000 mg by mouth daily.      potassium gluconate 595 MG TABS tablet Take 1 tablet (595 mg total) by mouth every other day. 90 tablet 3   rosuvastatin (CRESTOR) 20 MG tablet Take 1 tablet by mouth daily. 90 tablet 0   sertraline (ZOLOFT) 100 MG tablet Take 1 and  1/2 tablets (150 mg total) by mouth daily. 45 tablet 5   SPIRIVA HANDIHALER 18 MCG inhalation capsule Place 1 capsule (18 mcg total) into inhaler and inhale once daily as directed. 30 capsule 5   No current facility-administered medications for this visit.     Review of Systems  Please see the history of present illness.     All other systems reviewed and are otherwise negative except as noted above.  Physical Exam    Wt Readings from Last 3 Encounters:  02/23/22 191 lb (86.6 kg)  01/15/22 190 lb 9 oz (86.4 kg)  06/18/21 182 lb (82.6 kg)   VS: Vitals:   02/23/22 1448  BP: 110/62  Pulse: 86  SpO2: 98%  ,Body mass index is 31.78 kg/m.  Constitutional:      Appearance: Healthy appearance. Not in distress.  Neck:     Vascular: JVD normal.  Pulmonary:     Effort: Pulmonary effort is normal.     Breath sounds: No wheezing. No rales. Diminished in the bases Cardiovascular:     Normal rate. Regular rhythm. Normal S1. Normal S2.      Murmurs: Grade 2-3 systolic murmur at LSB Edema:    Peripheral edema absent.  Abdominal:     Palpations: Abdomen is soft non tender. There is no hepatomegaly.  Skin:    General: Skin is warm and dry.  Neurological:      General: No focal deficit present.     Mental Status: Alert and oriented to person, place and time.     Cranial Nerves: Cranial nerves are intact.  EKG/LABS/Other Studies Reviewed    ECG personally reviewed by me today - none performed today, but reviewed from last tracing 11/16/21 showing NSR 75bpm without acute changes  Lab Results  Component Value Date   WBC 7.3 01/15/2022   HGB 15.5 (H) 01/15/2022   HCT 46.5 (H) 01/15/2022   MCV 93.7 01/15/2022   PLT 203.0 01/15/2022   Lab Results  Component Value Date   CREATININE 0.87 01/15/2022   BUN 26 (H) 01/15/2022   NA 139 01/15/2022   K 4.8 01/15/2022   CL 104 01/15/2022   CO2 28 01/15/2022   Lab Results  Component Value Date   ALT 19 01/15/2022   AST 18 01/15/2022   ALKPHOS 74 01/15/2022   BILITOT 0.6 01/15/2022   Lab Results  Component Value Date   CHOL 115 01/15/2022   HDL 44.00 01/15/2022   LDLCALC 53 01/15/2022   TRIG 91.0 01/15/2022   CHOLHDL 3 01/15/2022    Lab Results  Component Value Date   HGBA1C 5.7 01/15/2022    Assessment & Plan    1.  Tricuspid regurgitation with RV dysfunction and pulmonary hypertension - s/p PFO closure and surgical debridement with history of endocarditis and 2007 complicated by cardiac tamponade requiring window -Echo completed 12/2020 with EF 60-65% and moderately elevated PA systolic pressure with severely dilated RA, moderately enlarged RV, moderately elevated PASP, and moderate to severe tricuspid regurg. -Patient is euvolemic on examination -We will repeat 2D echo to evaluate right heart function and tricuspid valve to ensure no significant change -Discussed SBE prophylaxis with future dental procedures  2.  Hyperlipidemia:  -Current LDL is 53, total cholesterol is 115 -Continue Crestor 20 mg daily -Continue heart healthy low-carb low sodium diet  3.  Tobacco abuse: -Patient has a history of COPD and is currently smoking 5 cigarettes a day but had quit for 2 months.  She was  encouraged to call [phone] quit now and develop new plan for smoking cessation  4.  COPD: -Smoking cessation strongly advised -Patient declined pulmonary referral at this time.  She was encouraged to follow-up with PCP regarding further management of COPD.  Disposition: Follow-up with Dietrich Pates, MD or APP in 12 months     Medication Adjustments/Labs and Tests Ordered: Current medicines are reviewed at length with the patient today.  Concerns regarding medicines are outlined above.  Tests Ordered: Orders Placed This Encounter  Procedures   ECHOCARDIOGRAM COMPLETE     Signed, Napoleon Form, Leodis Rains, NP 02/23/2022, 3:21 PM [facility]

## 2022-02-23 ENCOUNTER — Ambulatory Visit (INDEPENDENT_AMBULATORY_CARE_PROVIDER_SITE_OTHER): Payer: Medicare Other | Admitting: Nurse Practitioner

## 2022-02-23 ENCOUNTER — Encounter: Payer: Self-pay | Admitting: Nurse Practitioner

## 2022-02-23 VITALS — BP 110/62 | HR 86 | Ht 65.0 in | Wt 191.0 lb

## 2022-02-23 DIAGNOSIS — J449 Chronic obstructive pulmonary disease, unspecified: Secondary | ICD-10-CM

## 2022-02-23 DIAGNOSIS — E785 Hyperlipidemia, unspecified: Secondary | ICD-10-CM | POA: Diagnosis not present

## 2022-02-23 DIAGNOSIS — Z720 Tobacco use: Secondary | ICD-10-CM

## 2022-02-23 DIAGNOSIS — I5189 Other ill-defined heart diseases: Secondary | ICD-10-CM | POA: Diagnosis not present

## 2022-02-23 DIAGNOSIS — I071 Rheumatic tricuspid insufficiency: Secondary | ICD-10-CM | POA: Diagnosis not present

## 2022-02-23 NOTE — Patient Instructions (Signed)
Medication Instructions:  ?Your physician recommends that you continue on your current medications as directed. Please refer to the Current Medication list given to you today. ? ?*If you need a refill on your cardiac medications before your next appointment, please call your pharmacy* ? ? ?Lab Work: ?None ordered ? ?If you have labs (blood work) drawn today and your tests are completely normal, you will receive your results only by: ?MyChart Message (if you have MyChart) OR ?A paper copy in the mail ?If you have any lab test that is abnormal or we need to change your treatment, we will call you to review the results. ? ? ?Testing/Procedures: ?Your physician has requested that you have an echocardiogram. Echocardiography is a painless test that uses sound waves to create images of your heart. It provides your doctor with information about the size and shape of your heart and how well your heart?s chambers and valves are working. This procedure takes approximately one hour. There are no restrictions for this procedure. ? ? ? ?Follow-Up: ?At [facility], you and your health needs are our priority.  As part of our continuing mission to provide you with exceptional heart care, we have created designated Provider Care Teams.  These Care Teams include your primary Cardiologist (physician) and Advanced Practice Providers (APPs -  Physician Assistants and Nurse Practitioners) who all work together to provide you with the care you need, when you need it. ? ?We recommend signing up for the patient portal called "MyChart".  Sign up information is provided on this After Visit Summary.  MyChart is used to connect with patients for Virtual Visits (Telemedicine).  Patients are able to view lab/test results, encounter notes, upcoming appointments, etc.  Non-urgent messages can be sent to your provider as well.   ?To learn more about what you can do with MyChart, go to [email or web address].   ? ?Your next appointment:   ?12  month(s) ? ?The format for your next appointment:   ?In Person ? ?Provider:   ?Dietrich Pates, MD   ? ? ?Other Instructions ? ? ?Important Information About Sugar ? ? ? ? ?  ?

## 2022-03-05 ENCOUNTER — Ambulatory Visit (HOSPITAL_COMMUNITY): Payer: Medicare Other

## 2022-03-05 ENCOUNTER — Encounter: Payer: Self-pay | Admitting: Obstetrics & Gynecology

## 2022-03-12 ENCOUNTER — Ambulatory Visit (INDEPENDENT_AMBULATORY_CARE_PROVIDER_SITE_OTHER): Payer: Medicare Other | Admitting: Psychology

## 2022-03-12 DIAGNOSIS — F3289 Other specified depressive episodes: Secondary | ICD-10-CM
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
## 2022-03-16 ENCOUNTER — Ambulatory Visit (HOSPITAL_COMMUNITY): Payer: Medicare Other | Attending: Internal Medicine

## 2022-03-16 DIAGNOSIS — I361 Nonrheumatic tricuspid (valve) insufficiency: Secondary | ICD-10-CM | POA: Diagnosis not present

## 2022-03-16 DIAGNOSIS — I071 Rheumatic tricuspid insufficiency: Secondary | ICD-10-CM | POA: Diagnosis present

## 2022-03-16 LAB — ECHOCARDIOGRAM COMPLETE
Area-P 1/2: 4.8 cm2
S' Lateral: 2.9 cm

## 2022-03-19 ENCOUNTER — Ambulatory Visit: Payer: Self-pay | Admitting: Psychology

## 2022-04-01 ENCOUNTER — Encounter: Payer: Self-pay | Admitting: Nurse Practitioner

## 2022-04-01 ENCOUNTER — Encounter: Payer: Self-pay | Admitting: Internal Medicine

## 2022-04-06 NOTE — Telephone Encounter (Signed)
Pt left form for jury summons paperwork and it was placed in PCP folder.

## 2022-04-07 ENCOUNTER — Other Ambulatory Visit: Payer: Self-pay | Admitting: Nurse Practitioner

## 2022-04-07 NOTE — Telephone Encounter (Signed)
Jury duty form and letter placed upfront. Left message letting patient know and sent mychart message to the patient.

## 2022-04-12 ENCOUNTER — Ambulatory Visit: Payer: Medicare Other | Admitting: Psychology

## 2022-04-12 NOTE — Progress Notes (Incomplete)
° ° ° ° ° ° ° ° ° ° ° ° ° ° °  Tarrin Menn, LCSW °

## 2022-04-23 ENCOUNTER — Other Ambulatory Visit (HOSPITAL_COMMUNITY): Payer: Self-pay

## 2022-04-24 ENCOUNTER — Other Ambulatory Visit: Payer: Self-pay | Admitting: Nurse Practitioner

## 2022-04-24 DIAGNOSIS — F419 Anxiety disorder, unspecified: Secondary | ICD-10-CM

## 2022-04-26 ENCOUNTER — Ambulatory Visit (INDEPENDENT_AMBULATORY_CARE_PROVIDER_SITE_OTHER): Payer: Medicare Other | Admitting: Psychology

## 2022-04-26 ENCOUNTER — Other Ambulatory Visit (HOSPITAL_COMMUNITY): Payer: Self-pay

## 2022-04-26 ENCOUNTER — Telehealth: Payer: Self-pay | Admitting: Nurse Practitioner

## 2022-04-26 DIAGNOSIS — F3289 Other specified depressive episodes: Secondary | ICD-10-CM | POA: Diagnosis not present

## 2022-04-26 MED ORDER — DIAZEPAM 10 MG PO TABS
10.0000 mg | ORAL_TABLET | Freq: Two times a day (BID) | ORAL | 0 refills | Status: DC | PRN
  Filled 2022-04-26: qty 60, 30d supply, fill #0

## 2022-04-26 NOTE — Telephone Encounter (Signed)
Message to support staff

## 2022-04-26 NOTE — Telephone Encounter (Signed)
Lvm and sent mychart message for patient to call back and schedule CPE

## 2022-04-27 ENCOUNTER — Other Ambulatory Visit (HOSPITAL_COMMUNITY): Payer: Self-pay

## 2022-04-27 ENCOUNTER — Telehealth: Payer: Self-pay | Admitting: Nurse Practitioner

## 2022-04-27 NOTE — Telephone Encounter (Signed)
That is fine by me as long as the accepting provider is ok with it

## 2022-04-29 NOTE — Telephone Encounter (Signed)
Patient called to follow up and said she would like to transfer to Dr Janee Morn, Is this okay

## 2022-05-03 ENCOUNTER — Ambulatory Visit (INDEPENDENT_AMBULATORY_CARE_PROVIDER_SITE_OTHER): Payer: Medicare Other | Admitting: Psychology

## 2022-05-03 DIAGNOSIS — F3289 Other specified depressive episodes: Secondary | ICD-10-CM | POA: Diagnosis not present

## 2022-05-03 NOTE — Progress Notes (Signed)
[place] Counselor/Therapist Progress Note  Patient ID: Tina Fitzgerald, MRN: [id number]   Date: 05/03/22  Time Spent: 3:09 pm - 3:55 pm : 46 Minutes  Treatment Type: Individual Therapy.  Reported Symptoms: depression and anxiety.   Mental Status Exam: Appearance:  Casual and Neat     Behavior: Appropriate  Motor: Normal  Speech/Language:  Clear and Coherent  Affect: Congruent  Mood: normal  Thought process: normal  Thought content:   WNL  Sensory/Perceptual disturbances:   WNL  Orientation: oriented to person, place, time/date, and situation  Attention: Good  Concentration: Good  Memory: WNL  Fund of knowledge:  Good  Insight:   Good  Judgment:  Good  Impulse Control: Good   Risk Assessment: Danger to Self:  No Self-injurious Behavior: No Danger to Others: No Duty to Warn:no Physical Aggression / Violence:No  Access to Firearms a concern: No  Gang Involvement:No   Subjective:   Aleicia L Worrel participated from home, via video and consented to treatment. Therapist participated from home office. We met online due to [place] pandemic. Sunnie reviewed the events of the past week. Etosha noted her continued efforts to get settled in her new home. Cele noted struggles with her younger daughter, Dietrich Pates, who at times is a poor Marine scientist. She noted her attempts to manage her own anxiety regarding her daughter. She noted her daughter having numerous diagnoses including bipolar and schizophrenia. She noted her daughter's history of multiple expulsions in high school. She noted her own weight-gain, during the past year, and her attempts to lose weight. She noted her previous habit of eating one large meal, at night, which she believes contributed to her weight-gain including stress eating. We worked on identifying areas of control and lack of control, during the session. We reviewed her coping skills during the session and worked on finding alternatives. She discussed  her intent to eat more healthfully and exercise, within the guidelines provided by her medical providers. Therapist praised Shaterra for her plans to engage in self-care on a consistent basis. Therapist praised Kristiann for her effort and energy during the session.   Interventions: ACT & CBT  Diagnosis:  Other depression  Treatment Plan:  Client Abilities/Strengths Marijke is self-aware and motivated for change.   Support System: Family.   Client Treatment Preferences Outpatient therapy.   Client Statement of Needs Caliana would like to process her daughters' heroine relapse, maintain her sobriety, managing her symptoms, processing past events, managing negative self-talk, and bolster coping skills.   Treatment Level Weekly  Symptoms  Anxiety: Feeling anxious, difficulty managing worry, worrying about different things, trouble relaxing, irritability, and feeling afraid something awful might happen.     (Status: maintained) Depression: feeling down, loss of interest, lethargy, over eating, difficulty concentrating, psycho-motor retardation.    (Status: maintained)  Goals:   Veta experiences symptoms of depression and anxiety.   Target Date: 04/27/23 Frequency: Weekly  Progress: 0 Modality: individual    Therapist will provide referrals for additional resources as appropriate.  Therapist will provide psycho-education regarding Elira's diagnosis and corresponding treatment approaches and interventions. Licensed Clinical Social Worker, [place], LCSW will support the patient's ability to achieve the goals identified. will employ CBT, BA, Problem-solving, Solution Focused, Mindfulness,  coping skills, & other evidenced-based practices will be used to promote progress towards healthy functioning to help manage decrease symptoms associated with her diagnosis.   Reduce overall level, frequency, and intensity of the feelings of depression, anxiety and panic evidenced by decreased  overall  symptoms from 6 to 7 days/week to 0 to 1 days/week per client report for at least 3 consecutive months. Verbally express understanding of the relationship between feelings of depression, anxiety and their impact on thinking patterns and behaviors. Verbalize an understanding of the role that distorted thinking plays in creating fears, excessive worry, and ruminations.   (Therasa participated in the creation of the treatment plan)  Buena Irish, LCSW

## 2022-05-10 ENCOUNTER — Encounter: Payer: Self-pay | Admitting: Family Medicine

## 2022-05-10 ENCOUNTER — Ambulatory Visit (INDEPENDENT_AMBULATORY_CARE_PROVIDER_SITE_OTHER): Payer: Medicare Other | Admitting: Family Medicine

## 2022-05-10 VITALS — BP 138/86 | HR 74 | Temp 97.5°F | Ht 63.5 in | Wt 188.2 lb

## 2022-05-10 DIAGNOSIS — M25561 Pain in right knee: Secondary | ICD-10-CM

## 2022-05-10 DIAGNOSIS — Z720 Tobacco use: Secondary | ICD-10-CM | POA: Diagnosis not present

## 2022-05-10 DIAGNOSIS — J449 Chronic obstructive pulmonary disease, unspecified: Secondary | ICD-10-CM | POA: Diagnosis not present

## 2022-05-10 DIAGNOSIS — I071 Rheumatic tricuspid insufficiency: Secondary | ICD-10-CM

## 2022-05-10 DIAGNOSIS — F339 Major depressive disorder, recurrent, unspecified: Secondary | ICD-10-CM

## 2022-05-10 DIAGNOSIS — B181 Chronic viral hepatitis B without delta-agent: Secondary | ICD-10-CM

## 2022-05-10 DIAGNOSIS — K219 Gastro-esophageal reflux disease without esophagitis: Secondary | ICD-10-CM

## 2022-05-10 DIAGNOSIS — F1911 Other psychoactive substance abuse, in remission: Secondary | ICD-10-CM

## 2022-05-10 DIAGNOSIS — G8929 Other chronic pain: Secondary | ICD-10-CM

## 2022-05-10 DIAGNOSIS — Z8619 Personal history of other infectious and parasitic diseases: Secondary | ICD-10-CM

## 2022-05-10 DIAGNOSIS — E7800 Pure hypercholesterolemia, unspecified: Secondary | ICD-10-CM

## 2022-05-10 DIAGNOSIS — E785 Hyperlipidemia, unspecified: Secondary | ICD-10-CM | POA: Insufficient documentation

## 2022-05-10 DIAGNOSIS — F330 Major depressive disorder, recurrent, mild: Secondary | ICD-10-CM

## 2022-05-10 DIAGNOSIS — M25562 Pain in left knee: Secondary | ICD-10-CM

## 2022-05-10 NOTE — Assessment & Plan Note (Addendum)
Uses NRT, improving Down to 1 cigarrette a day

## 2022-05-10 NOTE — Assessment & Plan Note (Signed)
Lipids well controlled on rosuvastatin 20 mg

## 2022-05-10 NOTE — Progress Notes (Signed)
AFIA MESSENGER is a 51 y.o. female who presents today for an office visit.  Assessment/Plan:   Problem List Items Addressed This Visit       Cardiovascular and Mediastinum   Severe tricuspid regurgitation (Chronic)    Although patient has some shortness of breath, she has no chest pain Recent evaluation by cardiology showed stable TR,         Respiratory   COPD (chronic obstructive pulmonary disease) (HCC) - Primary    Likely cause of shortness of breath Improved if walks slowly and avoid heat Continue Spiriva and Atrovent as needed  Handicap placard provided Patient follow-up in 1 month if worsening        Digestive   GERD (gastroesophageal reflux disease)    Controlled with as needed famotidine      Chronic viral hepatitis B without delta agent and without coma (HCC)    Hepatitis C coinfection Recent LFTs normal       History of hepatitis C     Other   Tobacco abuse (Chronic)    Uses NRT, improving Down to 1 cigarrette a day      Pure hypercholesterolemia (Chronic)    Lipids well controlled on rosuvastatin 20 mg      Depression   Chronic pain of both knees   Hyperlipidemia   Mild episode of recurrent major depressive disorder (HCC)    Fortunately significantly improved on Zoloft 150 mg and diazepam 10 mg twice daily as needed Denies SI or HI Follow-up 1 month for as needed      Other Visit Diagnoses     History of substance abuse (HCC)              Subjective:  HPI:  Raylie L Carpenter is a 51 y.o. 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   She presents with chief complaint of Establish Care (Handicap placard for copd.) .   Patient here to establish care.  Patient recently moved to the area and was relocating to find a closer doctor.  Patient with a history of severe tricuspid regurgitation secondary from endocarditis.  Endocarditis occurred status post IV drug use.  Patient recently seen by cardiology in 04/2022.  Echo shows stable TR and normal EF on echocardiogram in 03/2022.  Patient denies any chest pain.  Patient with history of COPD.  She uses Spiriva and as needed Atrovent.  She does report worsening shortness of breath when walking outside to her car in the heat.  This is improved with rest and Atrovent.  Denies any difficulty ambulating or walking or trouble breathing in air conditioned setting.  Patient with smoking history, she is diligently to reduce smoking.  She used  to smoke 1.5 packs a day, she is now down to 1 cigarette a day.  Patient has been using NRT gum to supplant nicotine addiction.  Patient with history of depression and anxiety.  Reports that her symptoms were controlled on diazepam 10 mg twice daily as needed and Zoloft 150 mg daily.  Denies SI HI today  Patient is a history of GERD.  This is controlled with as needed OTC famotidine.  Patient has history of hepatitis C and hepatitis B from IV drug use.  Patient did  follow with GI, was discharged from practice status post treatment for hepatitis C.  CMP in 12/2021 showed normal creatinine and LFTs.  Patient with history of HLD.  Controlled on rosuvastatin 20 mg.  Patient has history of right knee pain.  This is controlled with diclofenac gel.        Objective:  Physical Exam: BP 138/86 (BP Location: Left Arm, Patient Position: Sitting, Cuff Size: Large)   Pulse 74   Temp (!) 97.5 F (36.4 C) (Temporal)   Ht 5' 3.5" (1.613 m)   Wt 188 lb 3.2 oz (85.4 kg)   LMP 03/23/2020   SpO2 98%   BMI 32.82 kg/m    General: No acute distress. Awake and conversant.  Eyes: Normal conjunctiva, anicteric. Round symmetric pupils.  ENT: Hearing grossly intact. No nasal discharge.  Neck: Neck is supple. No masses or thyromegaly.  Respiratory: Respirations are non-labored. No auditory wheezing.,  CTA B Skin: Warm. No rashes or ulcers.  Psych: Alert and oriented. Cooperative, Appropriate mood and affect, Normal judgment.  CV: No cyanosis or JVD, murmur appreciated, MSK: Normal ambulation. No clubbing  Neuro: Sensation and CN II-XII grossly normal.        Garner Nash, MD, MS

## 2022-05-10 NOTE — Assessment & Plan Note (Signed)
Fortunately significantly improved on Zoloft 150 mg and diazepam 10 mg twice daily as needed Denies SI or HI Follow-up 1 month for as needed

## 2022-05-10 NOTE — Assessment & Plan Note (Signed)
Controlled with as needed famotidine

## 2022-05-10 NOTE — Assessment & Plan Note (Signed)
Although patient has some shortness of breath, she has no chest pain Recent evaluation by cardiology showed stable TR,

## 2022-05-10 NOTE — Assessment & Plan Note (Signed)
Likely cause of shortness of breath Improved if walks slowly and avoid heat Continue Spiriva and Atrovent as needed  Handicap placard provided Patient follow-up in 1 month if worsening

## 2022-05-10 NOTE — Assessment & Plan Note (Addendum)
Hepatitis C coinfection Recent LFTs normal

## 2022-05-10 NOTE — Patient Instructions (Signed)
We are writing a handicap placard for parking

## 2022-05-13 ENCOUNTER — Telehealth: Payer: Self-pay | Admitting: Family Medicine

## 2022-05-13 ENCOUNTER — Ambulatory Visit: Payer: Medicare Other | Admitting: Family Medicine

## 2022-05-13 NOTE — Telephone Encounter (Signed)
7.13.23 pt no show letter has been sent

## 2022-05-16 ENCOUNTER — Other Ambulatory Visit: Payer: Self-pay | Admitting: Internal Medicine

## 2022-05-16 ENCOUNTER — Other Ambulatory Visit: Payer: Self-pay | Admitting: Nurse Practitioner

## 2022-05-16 DIAGNOSIS — F419 Anxiety disorder, unspecified: Secondary | ICD-10-CM

## 2022-05-17 ENCOUNTER — Other Ambulatory Visit (HOSPITAL_COMMUNITY): Payer: Self-pay

## 2022-05-17 MED ORDER — ROSUVASTATIN CALCIUM 20 MG PO TABS
20.0000 mg | ORAL_TABLET | Freq: Every day | ORAL | 3 refills | Status: DC
Start: 2022-05-17 — End: 2022-07-21
  Filled 2022-05-17: qty 90, 90d supply, fill #0

## 2022-05-17 MED ORDER — DIAZEPAM 10 MG PO TABS
10.0000 mg | ORAL_TABLET | Freq: Two times a day (BID) | ORAL | 0 refills | Status: DC | PRN
  Filled 2022-05-17 – 2022-06-15 (×2): qty 60, 30d supply, fill #0

## 2022-05-20 ENCOUNTER — Ambulatory Visit (INDEPENDENT_AMBULATORY_CARE_PROVIDER_SITE_OTHER): Payer: Medicare Other | Admitting: Psychology

## 2022-05-20 ENCOUNTER — Other Ambulatory Visit (HOSPITAL_COMMUNITY): Payer: Self-pay

## 2022-05-20 DIAGNOSIS — F3289 Other specified depressive episodes: Secondary | ICD-10-CM | POA: Diagnosis not present

## 2022-05-20 NOTE — Progress Notes (Signed)
Tina Fitzgerald, Tina Fitzgerald   Date: 05/20/22  Time Spent: 12:03 pm - 1:02 pm : 92 Minutes  Treatment Type: Individual Therapy.  Reported Symptoms: depression and anxiety.   Mental Status Exam: Appearance:  Casual and Neat     Behavior: Appropriate  Motor: Normal  Speech/Language:  Clear and Coherent  Affect: Congruent  Mood: sad  Thought process: normal  Thought content:   WNL  Sensory/Perceptual disturbances:   WNL  Orientation: oriented to person, place, time/date, and situation  Attention: Good  Concentration: Good  Memory: WNL  Fund of knowledge:  Good  Insight:   Good  Judgment:  Good  Impulse Control: Good   Risk Assessment: Danger to Self:  No Self-injurious Behavior: No Danger to Others: No Duty to Warn:no Physical Aggression / Violence:No  Access to Firearms a concern: No  Gang Involvement:No   Subjective:   Tina Fitzgerald participated from home, via video and consented to treatment. Therapist participated from home office. We met online due to [place] pandemic. Tina Fitzgerald reviewed the events of the past week. Tina Fitzgerald  noted having an argument with her daughter, Tina Fitzgerald (49), and noted her daughter being disrespectful. [name] noted Tina Fitzgerald not behaving this way in the past. She noted Tina Fitzgerald's current and past substance use. She noted getting quite angry at her daughter but managing her temper. She noted a need to set boundaries with her daughter and not engage, this way, due to her own health. We explored boundaries, going forward, and ways to communicate this positively and assertively. She noted her daughter, Tina Fitzgerald, who is slated for open-heart surgery due to a severe tricuspid regurgitation.Therapist modeled this during the session. Therapist praised Tina Fitzgerald for her effort and energy during the session.   Interventions: ACT & CBT  Diagnosis:  Other depression  Treatment Plan:  Client  Abilities/Strengths Tina Fitzgerald is self-aware and motivated for change.   Support System: Family.   Client Treatment Preferences Outpatient therapy.   Client Statement of Needs Tina Fitzgerald would like to process her daughters' heroine relapse, maintain her sobriety, managing her symptoms, processing past events, managing negative self-talk, and bolster coping skills.   Treatment Level Weekly  Symptoms  Anxiety: Feeling anxious, difficulty managing worry, worrying about different things, trouble relaxing, irritability, and feeling afraid something awful might happen.     (Status: maintained) Depression: feeling down, loss of interest, lethargy, over eating, difficulty concentrating, psycho-motor retardation.    (Status: declined)  Goals:   Gustavia experiences symptoms of depression and anxiety.   Target Date: 04/27/23 Frequency: Weekly  Progress: 0 Modality: individual    Therapist will provide referrals for additional resources as appropriate.  Therapist will provide psycho-education regarding Tina Fitzgerald diagnosis and corresponding treatment approaches and interventions. Licensed Clinical Social Worker, [place], LCSW will support the patient's ability to achieve the goals identified. will employ CBT, BA, Problem-solving, Solution Focused, Mindfulness,  coping skills, & other evidenced-based practices will be used to promote progress towards healthy functioning to help manage decrease symptoms associated with her diagnosis.   Reduce overall level, frequency, and intensity of the feelings of depression, anxiety and panic evidenced by decreased overall symptoms from 6 to 7 days/week to 0 to 1 days/week per client report for at least 3 consecutive months. Verbally express understanding of the relationship between feelings of depression, anxiety and their impact on thinking patterns and behaviors. Verbalize an understanding of the role that distorted thinking plays in creating fears, excessive worry,  and ruminations.   (Tina Fitzgerald participated in the creation of the treatment plan)  Tina Irish, LCSW

## 2022-05-25 ENCOUNTER — Other Ambulatory Visit: Payer: Self-pay

## 2022-05-25 ENCOUNTER — Encounter: Payer: Self-pay | Admitting: Family Medicine

## 2022-05-25 ENCOUNTER — Telehealth: Payer: Self-pay

## 2022-05-25 DIAGNOSIS — Z1211 Encounter for screening for malignant neoplasm of colon: Secondary | ICD-10-CM

## 2022-05-25 NOTE — Telephone Encounter (Signed)
Colorguard pended for signature. Please sign off.

## 2022-06-04 ENCOUNTER — Ambulatory Visit (INDEPENDENT_AMBULATORY_CARE_PROVIDER_SITE_OTHER): Payer: Medicare Other | Admitting: Psychology

## 2022-06-04 DIAGNOSIS — F3289 Other specified depressive episodes: Secondary | ICD-10-CM

## 2022-06-04 NOTE — Progress Notes (Signed)
[facility] Counselor/Therapist Progress Note  Patient ID: NORIE LATENDRESSE, MRN: [id number]   Date: 06/04/22  Time Spent: 11:39 am - 12:28 pm :49 Minutes  Treatment Type: Individual Therapy.  Reported Symptoms: depression and anxiety.   Mental Status Exam: Appearance:  Casual and Neat     Behavior: Appropriate  Motor: Normal  Speech/Language:  Clear and Coherent  Affect: Congruent  Mood: sad  Thought process: normal  Thought content:   WNL  Sensory/Perceptual disturbances:   WNL  Orientation: oriented to person, place, time/date, and situation  Attention: Good  Concentration: Good  Memory: WNL  Fund of knowledge:  Good  Insight:   Good  Judgment:  Good  Impulse Control: Good   Risk Assessment: Danger to Self:  No Self-injurious Behavior: No Danger to Others: No Duty to Warn:no Physical Aggression / Violence:No  Access to Firearms a concern: No  Gang Involvement:No   Subjective:   Kinberly L Delker participated from home, via video and consented to treatment. Therapist participated from home office. We met online due to [place] pandemic. Ilona reviewed the events of the past week. She noted concerns regarding her daughter Nira Conn who continues to abuse substances. She noted her daughter's erratic behavior. She noted having to deal with this behavior and noted her attempts to set boundaries with her daughter. She noted her worry about her daughter overdosing. She noted her worry that her son in-law is enabling heather. She noted her daughter's anger towards her. Kalese  noted a long family history of significant substance use. We discussed the importance of self-care, boundaries, communicating assertively, and managing stress proactively. Therapist modeled this during the session. Therapist praised Lin for her effort and energy during the session.   Interventions: ACT & CBT  Diagnosis:  Other depression  Treatment Plan:  Client Abilities/Strengths Casi is  self-aware and motivated for change.   Support System: Family.   Client Treatment Preferences Outpatient therapy.   Client Statement of Needs Antha would like to process her daughters' heroine relapse, maintain her sobriety, managing her symptoms, processing past events, managing negative self-talk, and bolster coping skills.   Treatment Level Weekly  Symptoms  Anxiety: Feeling anxious, difficulty managing worry, worrying about different things, trouble relaxing, irritability, and feeling afraid something awful might happen.     (Status: maintained) Depression: feeling down, loss of interest, lethargy, over eating, difficulty concentrating, psycho-motor retardation.    (Status: maintained)  Goals:   Andreika experiences symptoms of depression and anxiety.   Target Date: 04/27/23 Frequency: Weekly  Progress: 0 Modality: individual    Therapist will provide referrals for additional resources as appropriate.  Therapist will provide psycho-education regarding Breelynn's diagnosis and corresponding treatment approaches and interventions. Licensed Clinical Social Worker, [place], LCSW will support the patient's ability to achieve the goals identified. will employ CBT, BA, Problem-solving, Solution Focused, Mindfulness,  coping skills, & other evidenced-based practices will be used to promote progress towards healthy functioning to help manage decrease symptoms associated with her diagnosis.   Reduce overall level, frequency, and intensity of the feelings of depression, anxiety and panic evidenced by decreased overall symptoms from 6 to 7 days/week to 0 to 1 days/week per client report for at least 3 consecutive months. Verbally express understanding of the relationship between feelings of depression, anxiety and their impact on thinking patterns and behaviors. Verbalize an understanding of the role that distorted thinking plays in creating fears, excessive worry, and ruminations.   (Siniyah  participated in the creation of  the treatment plan)  Buena Irish, LCSW

## 2022-06-15 ENCOUNTER — Ambulatory Visit (INDEPENDENT_AMBULATORY_CARE_PROVIDER_SITE_OTHER): Payer: Medicare Other

## 2022-06-15 ENCOUNTER — Other Ambulatory Visit (HOSPITAL_COMMUNITY): Payer: Self-pay

## 2022-06-15 DIAGNOSIS — Z0000 Encounter for general adult medical examination without abnormal findings: Secondary | ICD-10-CM

## 2022-06-15 NOTE — Patient Instructions (Signed)
Tina Fitzgerald , Thank you for taking time to come for your Medicare Wellness Visit. I appreciate your ongoing commitment to your health goals. Please review the following plan we discussed and let me know if I can assist you in the future.   Screening recommendations/referrals: Colonoscopy: 07/06/2021 Mammogram: 02/14/2022 Bone Density: not of age  Recommended yearly ophthalmology/optometry visit for glaucoma screening and checkup Recommended yearly dental visit for hygiene and checkup  Vaccinations: Influenza vaccine: completed  Pneumococcal vaccine: completed  Tdap vaccine: 01/16/2014 Shingles vaccine: completed     Advanced directives: none   Conditions/risks identified: none   Next appointment: none    Preventive Care 51 Years and Older, Female Preventive care refers to lifestyle choices and visits with your health care provider that can promote health and wellness. What does preventive care include? A yearly physical exam. This is also called an annual well check. Dental exams once or twice a year. Routine eye exams. Ask your health care provider how often you should have your eyes checked. Personal lifestyle choices, including: Daily care of your teeth and gums. Regular physical activity. Eating a healthy diet. Avoiding tobacco and drug use. Limiting alcohol use. Practicing safe sex. Taking low-dose aspirin every day. Taking vitamin and mineral supplements as recommended by your health care provider. What happens during an annual well check? The services and screenings done by your health care provider during your annual well check will depend on your age, overall health, lifestyle risk factors, and family history of disease. Counseling  Your health care provider may ask you questions about your: Alcohol use. Tobacco use. Drug use. Emotional well-being. Home and relationship well-being. Sexual activity. Eating habits. History of falls. Memory and ability to  understand (cognition). Work and work Astronomer. Reproductive health. Screening  You may have the following tests or measurements: Height, weight, and BMI. Blood pressure. Lipid and cholesterol levels. These may be checked every 5 years, or more frequently if you are over 44 years old. Skin check. Lung cancer screening. You may have this screening every year starting at age 48 if you have a 30-pack-year history of smoking and currently smoke or have quit within the past 15 years. Fecal occult blood test (FOBT) of the stool. You may have this test every year starting at age 47. Flexible sigmoidoscopy or colonoscopy. You may have a sigmoidoscopy every 5 years or a colonoscopy every 10 years starting at age 70. Hepatitis C blood test. Hepatitis B blood test. Sexually transmitted disease (STD) testing. Diabetes screening. This is done by checking your blood sugar (glucose) after you have not eaten for a while (fasting). You may have this done every 1-3 years. Bone density scan. This is done to screen for osteoporosis. You may have this done starting at age 16. Mammogram. This may be done every 1-2 years. Talk to your health care provider about how often you should have regular mammograms. Talk with your health care provider about your test results, treatment options, and if necessary, the need for more tests. Vaccines  Your health care provider may recommend certain vaccines, such as: Influenza vaccine. This is recommended every year. Tetanus, diphtheria, and acellular pertussis (Tdap, Td) vaccine. You may need a Td booster every 10 years. Zoster vaccine. You may need this after age 45. Pneumococcal 13-valent conjugate (PCV13) vaccine. One dose is recommended after age 75. Pneumococcal polysaccharide (PPSV23) vaccine. One dose is recommended after age 57. Talk to your health care provider about which screenings and vaccines you need and  how often you need them. This information is not  intended to replace advice given to you by your health care provider. Make sure you discuss any questions you have with your health care provider. Document Released: 11/14/2015 Document Revised: 07/07/2016 Document Reviewed: 08/19/2015 Elsevier Interactive Patient Education  2017 [place] Prevention in the Home Falls can cause injuries. They can happen to people of all ages. There are many things you can do to make your home safe and to help prevent falls. What can I do on the outside of my home? Regularly fix the edges of walkways and driveways and fix any cracks. Remove anything that might make you trip as you walk through a door, such as a raised step or threshold. Trim any bushes or trees on the path to your home. Use bright outdoor lighting. Clear any walking paths of anything that might make someone trip, such as rocks or tools. Regularly check to see if handrails are loose or broken. Make sure that both sides of any steps have handrails. Any raised decks and porches should have guardrails on the edges. Have any leaves, snow, or ice cleared regularly. Use sand or salt on walking paths during winter. Clean up any spills in your garage right away. This includes oil or grease spills. What can I do in the bathroom? Use night lights. Install grab bars by the toilet and in the tub and shower. Do not use towel bars as grab bars. Use non-skid mats or decals in the tub or shower. If you need to sit down in the shower, use a plastic, non-slip stool. Keep the floor dry. Clean up any water that spills on the floor as soon as it happens. Remove soap buildup in the tub or shower regularly. Attach bath mats securely with double-sided non-slip rug tape. Do not have throw rugs and other things on the floor that can make you trip. What can I do in the bedroom? Use night lights. Make sure that you have a light by your bed that is easy to reach. Do not use any sheets or blankets that are  too big for your bed. They should not hang down onto the floor. Have a firm chair that has side arms. You can use this for support while you get dressed. Do not have throw rugs and other things on the floor that can make you trip. What can I do in the kitchen? Clean up any spills right away. Avoid walking on wet floors. Keep items that you use a lot in easy-to-reach places. If you need to reach something above you, use a strong step stool that has a grab bar. Keep electrical cords out of the way. Do not use floor polish or wax that makes floors slippery. If you must use wax, use non-skid floor wax. Do not have throw rugs and other things on the floor that can make you trip. What can I do with my stairs? Do not leave any items on the stairs. Make sure that there are handrails on both sides of the stairs and use them. Fix handrails that are broken or loose. Make sure that handrails are as long as the stairways. Check any carpeting to make sure that it is firmly attached to the stairs. Fix any carpet that is loose or worn. Avoid having throw rugs at the top or bottom of the stairs. If you do have throw rugs, attach them to the floor with carpet tape. Make sure that you have  a light switch at the top of the stairs and the bottom of the stairs. If you do not have them, ask someone to add them for you. What else can I do to help prevent falls? Wear shoes that: Do not have high heels. Have rubber bottoms. Are comfortable and fit you well. Are closed at the toe. Do not wear sandals. If you use a stepladder: Make sure that it is fully opened. Do not climb a closed stepladder. Make sure that both sides of the stepladder are locked into place. Ask someone to hold it for you, if possible. Clearly mark and make sure that you can see: Any grab bars or handrails. First and last steps. Where the edge of each step is. Use tools that help you move around (mobility aids) if they are needed. These  include: Canes. Walkers. Scooters. Crutches. Turn on the lights when you go into a dark area. Replace any light bulbs as soon as they burn out. Set up your furniture so you have a clear path. Avoid moving your furniture around. If any of your floors are uneven, fix them. If there are any pets around you, be aware of where they are. Review your medicines with your doctor. Some medicines can make you feel dizzy. This can increase your chance of falling. Ask your doctor what other things that you can do to help prevent falls. This information is not intended to replace advice given to you by your health care provider. Make sure you discuss any questions you have with your health care provider. Document Released: 08/14/2009 Document Revised: 03/25/2016 Document Reviewed: 11/22/2014 Elsevier Interactive Patient Education  2017 [employer].

## 2022-06-15 NOTE — Progress Notes (Signed)
Subjective:   Tina Fitzgerald is a 51 y.o. female who presents for Medicare Annual (Subsequent) preventive examination.   I connected with [name]  today by telephone and verified that I am speaking with the correct person using two identifiers. Location patient: home Location provider: work Persons participating in the virtual visit: patient, provider.   I discussed the limitations, risks, security and privacy concerns of performing an evaluation and management service by telephone and the availability of in person appointments. I also discussed with the patient that there may be a patient responsible charge related to this service. The patient expressed understanding and verbally consented to this telephonic visit.    Interactive audio and video telecommunications were attempted between this provider and patient, however failed, due to patient having technical difficulties OR patient did not have access to video capability.  We continued and completed visit with audio only.    Review of Systems     Cardiac Risk Factors include: advanced age (>10men, >74 women)     Objective:    Today's Vitals   There is no height or weight on file to calculate BMI.     06/15/2022    3:17 PM 09/24/2019    7:13 PM 07/12/2015   11:59 PM 04/21/2014   12:57 PM 05/08/2013    8:47 AM  Advanced Directives  Does Patient Have a Medical Advance Directive? No No No Patient does not have advance directive Patient does not have advance directive  Would patient like information on creating a medical advance directive? No - Patient declined No - Patient declined No - patient declined information    Pre-existing out of facility DNR order (yellow form or pink MOST form)    No No    Current Medications (verified) Outpatient Encounter Medications as of 06/15/2022  Medication Sig   Ascorbic Acid (VITAMIN C) 1000 MG tablet 2 daily   aspirin EC 81 MG tablet Take 81 mg by mouth daily. Swallow whole.   Biotin  w/ Vitamins C & E (HAIR/SKIN/NAILS PO) Take by mouth.   cyanocobalamin 1000 MCG tablet Take 1,000 mcg by mouth daily.   diazepam (VALIUM) 10 MG tablet Take 1 tablet (10 mg total) by mouth 2 (two) times daily as needed for anxiety.   diclofenac Sodium (VOLTAREN) 1 % GEL Apply topically 4 (four) times daily. As needed   famotidine (PEPCID) 20 MG tablet Take 20 mg by mouth daily as needed for heartburn.   ibuprofen (ADVIL,MOTRIN) 800 MG tablet Take 1 tablet by mouth every 8 (eight) hours as needed for mild pain.    ipratropium (ATROVENT HFA) 17 MCG/ACT inhaler Inhale 2 puffs into the lungs every 6 (six) hours as needed for wheezing.   Multiple Vitamins-Calcium (ONE-A-DAY WOMENS FORMULA PO) Take 1 tablet by mouth daily.   Omega-3 Fatty Acids (FISH OIL) 1000 MG CAPS Take 3,000 mg by mouth daily.    rosuvastatin (CRESTOR) 20 MG tablet Take 1 tablet by mouth daily.   sertraline (ZOLOFT) 100 MG tablet Take 1 and 1/2 tablets (150 mg total) by mouth daily.   SPIRIVA HANDIHALER 18 MCG inhalation capsule Place 1 capsule (18 mcg total) into inhaler and inhale once daily as directed.   No facility-administered encounter medications on file as of 06/15/2022.    Allergies (verified) Amoxicillin, Latex, and Penicillins   History: Past Medical History:  Diagnosis Date   Anxiety    ARF (acute renal failure) ([place])    a. During admission for TV endocarditis -  req dialysis in 2007 temporarily.   Arthritis    "knees" (05/09/2013)   Asthma    Cardiac arrest (HCC)    a. 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   Cardiac tamponade    a. s/p subxiphoid window 2007 in setting of TV endocarditis. b. She had suffered a mild anoxic injury during her recent hospitalization felt to be related to hypoperfusion /notes 06/15/2006 (05/09/2013)   Chronic headaches    Depression    Endocarditis    a. 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 s/p closure in 2007.   Septic pulmonary embolism (HCC)    a. During admission for TV endocarditis.   Severe tricuspid regurgitation    a. By TEE/cath 04/2013 - for outpt referral to [facility].    Tobacco abuse    Past Surgical History:  Procedure Laterality Date   CARDIAC CATHETERIZATION     multiple/notes 06/15/2006 (05/09/2013)   DILATION AND CURETTAGE OF UTERUS     "couple of those years ago; one a couple years ago after I lost a pregnancy" (05/09/2013)   LEFT AND RIGHT HEART CATHETERIZATION WITH CORONARY ANGIOGRAM N/A 05/08/2013   Procedure: LEFT AND RIGHT HEART CATHETERIZATION WITH CORONARY ANGIOGRAM;  Surgeon: Kathleene Hazel, MD;  Location: [facility] CATH LAB;  Service: Cardiovascular;  Laterality: N/A;   PATENT FORAMEN OVALE CLOSURE  04/22/2006   closed during tricuspid valve surgery   SUBXYPHOID PERICARDIAL WINDOW  05/07/2006   for large postoperative pericardial effusion    TEE WITHOUT CARDIOVERSION N/A 05/08/2013   Procedure: TRANSESOPHAGEAL ECHOCARDIOGRAM (TEE);  Surgeon: Lewayne Bunting, MD;  Location: [facility] ENDOSCOPY;  Service: Cardiovascular;  Laterality: N/A;   TRICUSPID VALVE SURGERY  04/22/2006   debridement of septal leaflet for MSSA bacterial endocarditis with closure PFO   Family History  Problem Relation Age of Onset   Drug abuse Mother    Depression Mother    COPD Mother    Arthritis Mother    Hyperlipidemia Mother     Alcohol abuse Father    Arthritis Father    Drug abuse Sister        at age 61   Depression Sister    Hyperlipidemia Brother    Depression Daughter    Alcohol abuse Daughter    Anxiety disorder Daughter    Drug abuse Daughter    Depression Daughter    Anxiety disorder Daughter    Drug abuse Daughter    Depression Daughter    Anxiety disorder Daughter    Drug abuse Daughter    Bipolar disorder Daughter    Schizophrenia Daughter    Drug abuse Son    Anxiety disorder Son    Depression Son    Anxiety disorder Son    Drug abuse Son    Depression Maternal Grandmother    Heart disease Maternal  Grandmother    Alcohol abuse Maternal Grandfather    Alzheimer's disease Maternal Grandfather    Arthritis Paternal Grandmother    Early death Paternal Grandfather    Alcohol abuse Paternal Grandfather    Ovarian cancer Cousin    Cancer Neg Hx    Hypertension Neg Hx    Kidney disease Neg Hx    Learning disabilities Neg Hx    Stroke Neg Hx    Social History   Socioeconomic History   Marital status: Divorced    Spouse name: Not on file   Number of children: 6   Years of education: Not on file   Highest education level: Not on file  Occupational History   Occupation: disabled  Tobacco Use   Smoking status: Some Days    Packs/day: 0.75    Years: 30.00    Total pack years: 22.50    Types: Cigarettes   Smokeless tobacco: Never  Vaping Use   Vaping Use: Never used  Substance and Sexual Activity   Alcohol use: Yes    Alcohol/week: 7.0 standard drinks of alcohol    Types: 7 Cans of beer per week    Comment: at times has mixed drink   Drug use: Not Currently    Types: Marijuana, Heroin, "Crack" cocaine, Cocaine    Comment: 05/09/2013 "<1 joint/day; last heroin & crack was before heart OR in 2007"   Sexual activity: Not Currently  Other Topics Concern   Not on file  Social History Narrative   Not on file   Social Determinants of Health   Financial Resource Strain: Low Risk   (06/15/2022)   Overall Financial Resource Strain (CARDIA)    Difficulty of Paying Living Expenses: Not hard at all  Food Insecurity: No Food Insecurity (06/15/2022)   Hunger Vital Sign    Worried About Running Out of Food in the Last Year: Never true    Ran Out of Food in the Last Year: Never true  Transportation Needs: No Transportation Needs (06/15/2022)   PRAPARE - Hydrologist (Medical): No    Lack of Transportation (Non-Medical): No  Physical Activity: Inactive (06/15/2022)   Exercise Vital Sign    Days of Exercise per Week: 0 days    Minutes of Exercise per Session: 0 min  Stress: No Stress Concern Present (06/15/2022)   [place]    Feeling of Stress : Not at all  Social Connections: Moderately Isolated (06/15/2022)   Social Connection and Isolation Panel [NHANES]    Frequency of Communication with Friends and Family: Twice a week    Frequency of Social Gatherings with Friends and Family: Twice a week    Attends Religious Services: More than 4 times per year    Active Member of [employer] or Organizations: No    Attends Music therapist: Never    Marital Status: Divorced    Tobacco Counseling Ready to quit: Not Answered Counseling given: Not Answered   Clinical Intake:  Pre-visit preparation completed: Yes  Pain : No/denies pain     Nutritional Risks: None Diabetes: No  How often do you need to have someone help you when you read instructions, pamphlets, or other written materials from your doctor or pharmacy?: 1 - Never What is the last grade level you completed in school?: college  Diabetic?no  Interpreter Needed?: No  Information entered by :: L.Wilson,LPN   Activities of Daily Living    06/15/2022  3:18 PM 06/15/2022    2:51 PM  In your present state of health, do you have any difficulty performing the following activities:  Hearing? 0 0  Vision? 0 0   Difficulty concentrating or making decisions? 0 1  Walking or climbing stairs? 0 1  Dressing or bathing? 0 0  Doing errands, shopping? 0 0  Preparing Food and eating ? N N  Using the Toilet? N N  In the past six months, have you accidently leaked urine? N Y  Do you have problems with loss of bowel control? N N  Managing your Medications? N N  Managing your Finances? N N  Housekeeping or managing your Housekeeping? N N    Patient Care Team: Garnette Gunner, MD as PCP - General (Family Medicine) Pricilla Riffle, MD as PCP - Cardiology (Cardiology) Kathleene Hazel, MD as Attending Physician (Cardiology) Purcell Nails, MD (Inactive) as Attending Physician (Cardiothoracic Surgery)  Indicate any recent Medical Services you may have received from other than [facility] providers in the past year (date may be approximate).     Assessment:   This is a routine wellness examination for Taylee.  Hearing/Vision screen Vision Screening - Comments:: Annual eye exams wear glasses   Dietary issues and exercise activities discussed: Current Exercise Habits: The patient does not participate in regular exercise at present, Exercise limited by: cardiac condition(s)   Goals Addressed   None    Depression Screen    06/15/2022    3:18 PM 06/15/2022    3:16 PM 05/10/2022    4:19 PM 01/15/2022   12:51 PM  PHQ 2/9 Scores  PHQ - 2 Score 0 0 1 3  PHQ- 9 Score   5 13    Fall Risk    06/15/2022    3:18 PM 06/15/2022    2:51 PM  Fall Risk   Falls in the past year? 1 1  Number falls in past yr: 0 1  Injury with Fall? 1 1  Risk for fall due to : Impaired vision   Follow up Falls evaluation completed;Education provided     FALL RISK PREVENTION PERTAINING TO THE HOME:  Any stairs in or around the home? No  If so, are there any without handrails? No  Home free of loose throw rugs in walkways, pet beds, electrical cords, etc? Yes  Adequate lighting in your home to reduce risk of falls? Yes    ASSISTIVE DEVICES UTILIZED TO PREVENT FALLS:  Life alert? No  Use of a cane, walker or w/c? No  Grab bars in the bathroom? No  Shower chair or bench in shower? No  Elevated toilet seat or a handicapped toilet? No     Cognitive Function:    Normal cognitive status assessed by telephone conversation by this Nurse Health Advisor. No abnormalities found.      06/15/2022    3:19 PM  6CIT Screen  What Year? 0 points  What month? 0 points  What time? 0 points  Count back from 20 0 points  Months in reverse 0 points  Repeat phrase 0 points  Total Score 0 points    Immunizations Immunization History  Administered Date(s) Administered   Influenza Split 08/01/2013   Influenza,inj,Quad PF,6+ Mos 08/22/2014, 10/22/2021   Moderna Sars-Covid-2 Vaccination 01/23/2020, 02/20/2020   Pneumococcal Polysaccharide-23 04/17/2014   Tdap 01/16/2014   Zoster Recombinat (Shingrix) 10/22/2021    TDAP status: Up to date  Flu Vaccine status: Up to date  Pneumococcal vaccine  status: Up to date  Covid-19 vaccine status: Completed vaccines  Qualifies for Shingles Vaccine? Yes   Zostavax completed Yes   Shingrix Completed?: Yes  Screening Tests Health Maintenance  Topic Date Due   COVID-19 Vaccine (3 - Moderna risk series) 03/19/2020   Zoster Vaccines- Shingrix (2 of 2) 12/17/2021   INFLUENZA VACCINE  06/01/2022   MAMMOGRAM  02/05/2023   TETANUS/TDAP  01/17/2024   PAP SMEAR-Modifier  01/31/2024   COLONOSCOPY (Pts 45-55yrs Insurance coverage will need to be confirmed)  07/07/2031   Hepatitis C Screening  Completed   HIV Screening  Completed   HPV VACCINES  Aged Out    Health Maintenance  Health Maintenance Due  Topic Date Due   COVID-19 Vaccine (3 - Moderna risk series) 03/19/2020   Zoster Vaccines- Shingrix (2 of 2) 12/17/2021   INFLUENZA VACCINE  06/01/2022    Colorectal cancer screening: Type of screening: Colonoscopy. Completed 07/06/2021. Repeat every 10  years  Mammogram status: Completed 02/04/2022. Repeat every year  Bone Density status: Ordered not of age . Pt provided with contact info and advised to call to schedule appt.  Lung Cancer Screening: (Low Dose CT Chest recommended if Age 10-80 years, 30 pack-year currently smoking OR have quit w/in 15years.) does not qualify.   Lung Cancer Screening Referral: n/a  Additional Screening:  Hepatitis C Screening: does not qualify;   Vision Screening: Recommended annual ophthalmology exams for early detection of glaucoma and other disorders of the eye. Is the patient up to date with their annual eye exam?  Yes  Who is the provider or what is the name of the office in which the patient attends annual eye exams? [facility]  If pt is not established with a provider, would they like to be referred to a provider to establish care? No .   Dental Screening: Recommended annual dental exams for proper oral hygiene  Community Resource Referral / Chronic Care Management: CRR required this visit?  No   CCM required this visit?  No      Plan:     I have personally reviewed and noted the following in the patient's chart:   Medical and social history Use of alcohol, tobacco or illicit drugs  Current medications and supplements including opioid prescriptions.  Functional ability and status Nutritional status Physical activity Advanced directives List of other physicians Hospitalizations, surgeries, and ER visits in previous 12 months Vitals Screenings to include cognitive, depression, and falls Referrals and appointments  In addition, I have reviewed and discussed with patient certain preventive protocols, quality metrics, and best practice recommendations. A written personalized care plan for preventive services as well as general preventive health recommendations were provided to patient.     Daphane Shepherd, LPN   [id number]   Nurse Notes: none

## 2022-06-18 ENCOUNTER — Encounter: Payer: Medicare Other | Admitting: Psychology

## 2022-06-18 NOTE — Progress Notes (Signed)
This encounter was created in error - please disregard.

## 2022-06-21 ENCOUNTER — Other Ambulatory Visit (HOSPITAL_COMMUNITY): Payer: Self-pay

## 2022-07-01 ENCOUNTER — Telehealth: Payer: Self-pay | Admitting: Family Medicine

## 2022-07-01 NOTE — Telephone Encounter (Signed)
Pt misplaced the handicapp placard you gave her . Pt wanted to know can you give her another one

## 2022-07-01 NOTE — Telephone Encounter (Signed)
Please advise, see below. Ok to replace?

## 2022-07-02 ENCOUNTER — Ambulatory Visit (INDEPENDENT_AMBULATORY_CARE_PROVIDER_SITE_OTHER): Payer: Medicare Other | Admitting: Psychology

## 2022-07-02 DIAGNOSIS — F3289 Other specified depressive episodes: Secondary | ICD-10-CM | POA: Diagnosis not present

## 2022-07-02 NOTE — Progress Notes (Signed)
[facility] Counselor/Therapist Progress Note  Patient ID: Tina Fitzgerald, MRN: [id number]   Date: 07/02/22  Time Spent: 12:04 pm - 12:55 pm : 51 Minutes  Treatment Type: Individual Therapy.  Reported Symptoms: depression and anxiety.   Mental Status Exam: Appearance:  Casual and Neat     Behavior: Appropriate  Motor: Normal  Speech/Language:  Clear and Coherent  Affect: Congruent  Mood: sad  Thought process: normal  Thought content:   WNL  Sensory/Perceptual disturbances:   WNL  Orientation: oriented to person, place, time/date, and situation  Attention: Good  Concentration: Good  Memory: WNL  Fund of knowledge:  Good  Insight:   Good  Judgment:  Good  Impulse Control: Good   Risk Assessment: Danger to Self:  No Self-injurious Behavior: No Danger to Others: No Duty to Warn:no Physical Aggression / Violence:No  Access to Firearms a concern: No  Gang Involvement:No   Subjective:   Tina Fitzgerald participated from home, via video and consented to treatment. Therapist participated from home office. We met online due to [place] pandemic. Tina Fitzgerald reviewed the events of the past week. Tina Fitzgerald noted her sister passing August 29th, 2022. She noted her son in-law passing on August 29th. She noted her daughter's husband Tina Fitzgerald) having numerous health complications and a history of substance use. She noted her attempts to support her daughter who is grieving. She noted worrying about her daughter as a result. She noted her attempts to provide support while maintaining her own boundaries.  We worked on exploring some of these boundaries during the session.  Therapist encouraged mindfulness regarding own needs and boundaries.  Therapist encouraged family to identify positive and helpful coping skills in lieu of smoking cigarettes which she recently began engaging in due to stress.  We will process this going forward.  Tina Fitzgerald was engaged and motivated in the session and expressed  commitment towards her goals.  Therapist praised Tina Fitzgerald for her effort and provided supportive therapy.  Follow-up was scheduled for continued treatment.  Tina Fitzgerald continues to benefit from frequent sessions.  Interventions: Interpersonal  Diagnosis:  Other depression  Treatment Plan:  Client Abilities/Strengths Tina Fitzgerald is self-aware and motivated for change.   Support System: Family.   Client Treatment Preferences Outpatient therapy.   Client Statement of Needs Tina Fitzgerald would like to process her daughters' heroine relapse, maintain her sobriety, managing her symptoms, processing past events, managing negative self-talk, and bolster coping skills.   Treatment Level Weekly  Symptoms  Anxiety: Feeling anxious, difficulty managing worry, worrying about different things, trouble relaxing, irritability, and feeling afraid something awful might happen.     (Status: maintained) Depression: feeling down, loss of interest, lethargy, over eating, difficulty concentrating, psycho-motor retardation.    (Status: maintained)  Goals:   Tina Fitzgerald experiences symptoms of depression and anxiety.   Target Date: 04/27/23 Frequency: Weekly  Progress: 0 Modality: individual    Therapist will provide referrals for additional resources as appropriate.  Therapist will provide psycho-education regarding Tina Fitzgerald's diagnosis and corresponding treatment approaches and interventions. Licensed Clinical Social Worker, [place], LCSW will support the patient's ability to achieve the goals identified. will employ CBT, BA, Problem-solving, Solution Focused, Mindfulness,  coping skills, & other evidenced-based practices will be used to promote progress towards healthy functioning to help manage decrease symptoms associated with her diagnosis.   Reduce overall level, frequency, and intensity of the feelings of depression, anxiety and panic evidenced by decreased overall symptoms from 6 to 7 days/week to 0 to 1 days/week  per  client report for at least 3 consecutive months. Verbally express understanding of the relationship between feelings of depression, anxiety and their impact on thinking patterns and behaviors. Verbalize an understanding of the role that distorted thinking plays in creating fears, excessive worry, and ruminations.   (Tina Fitzgerald participated in the creation of the treatment plan)  Buena Irish, LCSW

## 2022-07-09 ENCOUNTER — Other Ambulatory Visit (HOSPITAL_COMMUNITY): Payer: Self-pay

## 2022-07-15 ENCOUNTER — Encounter: Payer: Self-pay | Admitting: Family Medicine

## 2022-07-19 ENCOUNTER — Ambulatory Visit: Payer: Medicare HMO | Admitting: Family Medicine

## 2022-07-20 ENCOUNTER — Ambulatory Visit (INDEPENDENT_AMBULATORY_CARE_PROVIDER_SITE_OTHER): Payer: Medicare HMO | Admitting: Psychology

## 2022-07-20 DIAGNOSIS — F3289 Other specified depressive episodes: Secondary | ICD-10-CM | POA: Diagnosis not present

## 2022-07-20 NOTE — Progress Notes (Signed)
[facility] Counselor/Therapist Progress Note  Patient ID: Tina Fitzgerald, MRN: [id number]   Date: 07/20/22  Time Spent: 4:06 pm - 4:45 pm : 39 Minutes  Treatment Type: Individual Therapy.  Reported Symptoms: depression and anxiety.   Mental Status Exam: Appearance:  Casual and Neat     Behavior: Appropriate  Motor: Normal  Speech/Language:  Clear and Coherent  Affect: Congruent  Mood: normal  Thought process: normal  Thought content:   WNL  Sensory/Perceptual disturbances:   WNL  Orientation: oriented to person, place, time/date, and situation  Attention: Good  Concentration: Good  Memory: WNL  Fund of knowledge:  Good  Insight:   Good  Judgment:  Good  Impulse Control: Good   Risk Assessment: Danger to Self:  No Self-injurious Behavior: No Danger to Others: No Duty to Warn:no Physical Aggression / Violence:No  Access to Firearms a concern: No  Gang Involvement:No   Subjective:   Tina Fitzgerald participated from home, via video and consented to treatment. Therapist participated from home office. We met online due to [place] pandemic. Hailie reviewed the events of the past week. She noted stressors with her youngest daughter and noted her attempts to help guide her and address things appropriately. She noted her worry that her daughter would react poorly to stress. She noted her daughter who recently lost her partner. She noted the family being supportive. Hawley noted doing much better as a result of not being in the environment with people who are substance users. She provided history regarding Heather's substance use. She noted worry regarding her other daughter, Ishmael Holter, who has an upcoming heart surgery. Braidyn noted taking her medicine as prescribed. Feleshia is working on discontinuing smoking and noted employing positive self-talk and creating boundaries for self. Therapist encouraged self-care and boundaries for others. Therapist validated Akeelah's feelings and  provided supportive therapy.   Interventions: Interpersonal  Diagnosis:  Other depression  Treatment Plan:  Client Abilities/Strengths Orlanda is self-aware and motivated for change.   Support System: Family.   Client Treatment Preferences Outpatient therapy.   Client Statement of Needs Brittanyann would like to process her daughters' heroine relapse, maintain her sobriety, managing her symptoms, processing past events, managing negative self-talk, and bolster coping skills.   Treatment Level Weekly  Symptoms  Anxiety: Feeling anxious, difficulty managing worry, worrying about different things, trouble relaxing, irritability, and feeling afraid something awful might happen.     (Status: maintained) Depression: feeling down, loss of interest, lethargy, over eating, difficulty concentrating, psycho-motor retardation.    (Status: maintained)  Goals:   Aurorah experiences symptoms of depression and anxiety.   Target Date: 04/27/23 Frequency: Weekly  Progress: 0 Modality: individual    Therapist will provide referrals for additional resources as appropriate.  Therapist will provide psycho-education regarding Jaxon's diagnosis and corresponding treatment approaches and interventions. Licensed Clinical Social Worker, [place], LCSW will support the patient's ability to achieve the goals identified. will employ CBT, BA, Problem-solving, Solution Focused, Mindfulness,  coping skills, & other evidenced-based practices will be used to promote progress towards healthy functioning to help manage decrease symptoms associated with her diagnosis.   Reduce overall level, frequency, and intensity of the feelings of depression, anxiety and panic evidenced by decreased overall symptoms from 6 to 7 days/week to 0 to 1 days/week per client report for at least 3 consecutive months. Verbally express understanding of the relationship between feelings of depression, anxiety and their impact on thinking  patterns and behaviors. Verbalize an understanding of  the role that distorted thinking plays in creating fears, excessive worry, and ruminations.   (Mayar participated in the creation of the treatment plan)  Buena Irish, LCSW

## 2022-07-21 ENCOUNTER — Other Ambulatory Visit: Payer: Self-pay

## 2022-07-21 MED ORDER — ROSUVASTATIN CALCIUM 20 MG PO TABS: 20.0000 mg | ORAL_TABLET | Freq: Every day | ORAL | 2 refills | Status: DC

## 2022-07-26 ENCOUNTER — Other Ambulatory Visit: Payer: Self-pay

## 2022-07-26 DIAGNOSIS — F320 Major depressive disorder, single episode, mild: Secondary | ICD-10-CM

## 2022-07-26 MED ORDER — IBUPROFEN 800 MG PO TABS: 800.0000 mg | ORAL_TABLET | Freq: Three times a day (TID) | ORAL | 0 refills | Status: DC | PRN

## 2022-07-26 MED ORDER — SPIRIVA HANDIHALER 18 MCG IN CAPS: 1.0000 | ORAL_CAPSULE | Freq: Every day | RESPIRATORY_TRACT | 5 refills | Status: DC

## 2022-07-26 MED ORDER — SERTRALINE HCL 100 MG PO TABS: 150.0000 mg | ORAL_TABLET | Freq: Every day | ORAL | 5 refills | Status: DC

## 2022-07-26 NOTE — Telephone Encounter (Signed)
Incoming fax from [facility] mail delivery rx refill for Ibuprofen 800mg , Spiriva w/ Handihaler 18 MCG and inhalation capsules, Sertraline 100 mg tablet. Contacted patient and confirmed pharmacy.   Chart supports rx. Last OV: 05/10/2022 Next OV: 08/10/2022

## 2022-08-03 ENCOUNTER — Other Ambulatory Visit: Payer: Self-pay | Admitting: Family Medicine

## 2022-08-03 DIAGNOSIS — F419 Anxiety disorder, unspecified: Secondary | ICD-10-CM

## 2022-08-04 MED ORDER — DIAZEPAM 10 MG PO TABS: 10.0000 mg | ORAL_TABLET | Freq: Two times a day (BID) | ORAL | 1 refills | Status: DC | PRN

## 2022-08-05 ENCOUNTER — Other Ambulatory Visit: Payer: Self-pay | Admitting: Family Medicine

## 2022-08-05 ENCOUNTER — Telehealth: Payer: Self-pay | Admitting: Family Medicine

## 2022-08-05 ENCOUNTER — Other Ambulatory Visit (HOSPITAL_COMMUNITY): Payer: Self-pay

## 2022-08-05 DIAGNOSIS — F419 Anxiety disorder, unspecified: Secondary | ICD-10-CM

## 2022-08-05 DIAGNOSIS — G8929 Other chronic pain: Secondary | ICD-10-CM

## 2022-08-05 DIAGNOSIS — F320 Major depressive disorder, single episode, mild: Secondary | ICD-10-CM

## 2022-08-05 DIAGNOSIS — J449 Chronic obstructive pulmonary disease, unspecified: Secondary | ICD-10-CM

## 2022-08-05 MED ORDER — SERTRALINE HCL 100 MG PO TABS
150.0000 mg | ORAL_TABLET | Freq: Every day | ORAL | 5 refills | Status: DC
  Filled 2022-08-12: qty 45, 30d supply, fill #0
  Filled 2022-10-07: qty 45, 30d supply, fill #1

## 2022-08-05 MED ORDER — IBUPROFEN 800 MG PO TABS: 800.0000 mg | ORAL_TABLET | Freq: Three times a day (TID) | ORAL | 0 refills | Status: DC | PRN

## 2022-08-05 MED ORDER — DIAZEPAM 10 MG PO TABS: 10.0000 mg | ORAL_TABLET | Freq: Two times a day (BID) | ORAL | 1 refills | Status: DC | PRN

## 2022-08-05 MED ORDER — SPIRIVA HANDIHALER 18 MCG IN CAPS: 1.0000 | ORAL_CAPSULE | Freq: Every day | RESPIRATORY_TRACT | 5 refills | Status: DC

## 2022-08-05 NOTE — Telephone Encounter (Signed)
Caller Name: Veanna Dower Call back phone #: [phone]  Reason for Call: Pt's insurance was updated and with this her pharmacy was changed back to [facility]. This is a mail order pharmacy and they sent her Diazepam to the wrong address. Is it possible to send her a temporary refill for a few days until her address is updated? If that is possible [facility] [address], [place], [place] [address]

## 2022-08-05 NOTE — Telephone Encounter (Addendum)
Tried calling patient , but mailbox is full. Will send a mychart message regarding annotation below.

## 2022-08-09 NOTE — Telephone Encounter (Signed)
[facility] is calling stating they sent this script to her old address and they are waiting to get this back. In the mean time they are wanting a new script sent over.  [facility] @ [phone] Fax # [phone]

## 2022-08-09 NOTE — Telephone Encounter (Signed)
Pharmacy stated that they need a new script for Diazapam 10 mg to send out new order to patient's home.

## 2022-08-09 NOTE — Addendum Note (Signed)
Addended by: Renaee Munda on: 08/09/2022 03:58 PM   Modules accepted: Orders

## 2022-08-10 ENCOUNTER — Ambulatory Visit: Payer: Medicare Other | Admitting: Family Medicine

## 2022-08-10 NOTE — Telephone Encounter (Signed)
[facility] has been advised

## 2022-08-11 ENCOUNTER — Other Ambulatory Visit: Payer: Self-pay

## 2022-08-11 ENCOUNTER — Other Ambulatory Visit (HOSPITAL_COMMUNITY): Payer: Self-pay

## 2022-08-11 NOTE — Telephone Encounter (Signed)
Pt called requesting refill on Diazepam/Valium. Pt was [facility] 1 refill. Pt said they told her they didn't have it. I called the pharmacy and tech told me the insurance denied as too soon and RX cannot be filled until 08/27/2022. I have notified pt of this. She said she is going to contact her insurance.

## 2022-08-12 ENCOUNTER — Other Ambulatory Visit (HOSPITAL_COMMUNITY): Payer: Self-pay

## 2022-08-18 ENCOUNTER — Ambulatory Visit: Payer: Medicare HMO | Admitting: Psychology

## 2022-08-19 ENCOUNTER — Ambulatory Visit: Payer: Medicare Other | Admitting: Orthopedic Surgery

## 2022-08-23 ENCOUNTER — Encounter: Payer: Self-pay | Admitting: Adult Health

## 2022-08-23 ENCOUNTER — Ambulatory Visit (INDEPENDENT_AMBULATORY_CARE_PROVIDER_SITE_OTHER): Payer: Medicare Other | Admitting: Adult Health

## 2022-08-23 ENCOUNTER — Encounter: Payer: Self-pay | Admitting: *Deleted

## 2022-08-23 VITALS — BP 144/80 | HR 87 | Temp 97.3°F | Resp 20 | Ht 63.6 in | Wt 197.0 lb

## 2022-08-23 DIAGNOSIS — Z8619 Personal history of other infectious and parasitic diseases: Secondary | ICD-10-CM | POA: Diagnosis not present

## 2022-08-23 DIAGNOSIS — M25562 Pain in left knee: Secondary | ICD-10-CM

## 2022-08-23 DIAGNOSIS — G8929 Other chronic pain: Secondary | ICD-10-CM

## 2022-08-23 DIAGNOSIS — M25561 Pain in right knee: Secondary | ICD-10-CM

## 2022-08-23 DIAGNOSIS — Z0000 Encounter for general adult medical examination without abnormal findings: Secondary | ICD-10-CM | POA: Diagnosis not present

## 2022-08-23 DIAGNOSIS — F17210 Nicotine dependence, cigarettes, uncomplicated: Secondary | ICD-10-CM

## 2022-08-23 DIAGNOSIS — J449 Chronic obstructive pulmonary disease, unspecified: Secondary | ICD-10-CM | POA: Diagnosis not present

## 2022-08-23 DIAGNOSIS — F1210 Cannabis abuse, uncomplicated: Secondary | ICD-10-CM

## 2022-08-23 DIAGNOSIS — Z113 Encounter for screening for infections with a predominantly sexual mode of transmission: Secondary | ICD-10-CM

## 2022-08-23 DIAGNOSIS — R7303 Prediabetes: Secondary | ICD-10-CM | POA: Diagnosis not present

## 2022-08-23 DIAGNOSIS — K219 Gastro-esophageal reflux disease without esophagitis: Secondary | ICD-10-CM | POA: Diagnosis not present

## 2022-08-23 DIAGNOSIS — F329 Major depressive disorder, single episode, unspecified: Secondary | ICD-10-CM

## 2022-08-23 DIAGNOSIS — Z13820 Encounter for screening for osteoporosis: Secondary | ICD-10-CM

## 2022-08-23 DIAGNOSIS — F419 Anxiety disorder, unspecified: Secondary | ICD-10-CM

## 2022-08-23 DIAGNOSIS — L0292 Furuncle, unspecified: Secondary | ICD-10-CM

## 2022-08-23 DIAGNOSIS — E785 Hyperlipidemia, unspecified: Secondary | ICD-10-CM

## 2022-08-23 MED ORDER — SPIRIVA HANDIHALER 18 MCG IN CAPS: 1.0000 | ORAL_CAPSULE | Freq: Every day | RESPIRATORY_TRACT | 5 refills | Status: DC

## 2022-08-23 MED ORDER — SACCHAROMYCES BOULARDII 250 MG PO CAPS: 250.0000 mg | ORAL_CAPSULE | Freq: Two times a day (BID) | ORAL | 11 refills | Status: DC

## 2022-08-23 MED ORDER — DOXYCYCLINE HYCLATE 100 MG PO TABS: 100.0000 mg | ORAL_TABLET | Freq: Two times a day (BID) | ORAL | 0 refills | Status: AC

## 2022-08-23 MED ORDER — DIAZEPAM 5 MG PO TABS: 5.0000 mg | ORAL_TABLET | Freq: Two times a day (BID) | ORAL | 0 refills | Status: DC | PRN

## 2022-08-23 NOTE — Patient Instructions (Signed)
Preventive Care 40-51 Years Old, Female Preventive care refers to lifestyle choices and visits with your health care provider that can promote health and wellness. Preventive care visits are also called wellness exams. What can I expect for my preventive care visit? Counseling Your health care provider may ask you questions about your: Medical history, including: Past medical problems. Family medical history. Pregnancy history. Current health, including: Menstrual cycle. Method of birth control. Emotional well-being. Home life and relationship well-being. Sexual activity and sexual health. Lifestyle, including: Alcohol, nicotine or tobacco, and drug use. Access to firearms. Diet, exercise, and sleep habits. Work and work environment. Sunscreen use. Safety issues such as seatbelt and bike helmet use. Physical exam Your health care provider will check your: Height and weight. These may be used to calculate your BMI (body mass index). BMI is a measurement that tells if you are at a healthy weight. Waist circumference. This measures the distance around your waistline. This measurement also tells if you are at a healthy weight and may help predict your risk of certain diseases, such as type 2 diabetes and high blood pressure. Heart rate and blood pressure. Body temperature. Skin for abnormal spots. What immunizations do I need?  Vaccines are usually given at various ages, according to a schedule. Your health care provider will recommend vaccines for you based on your age, medical history, and lifestyle or other factors, such as travel or where you work. What tests do I need? Screening Your health care provider may recommend screening tests for certain conditions. This may include: Lipid and cholesterol levels. Diabetes screening. This is done by checking your blood sugar (glucose) after you have not eaten for a while (fasting). Pelvic exam and Pap test. Hepatitis B test. Hepatitis C  test. HIV (human immunodeficiency virus) test. STI (sexually transmitted infection) testing, if you are at risk. Lung cancer screening. Colorectal cancer screening. Mammogram. Talk with your health care provider about when you should start having regular mammograms. This may depend on whether you have a family history of breast cancer. BRCA-related cancer screening. This may be done if you have a family history of breast, ovarian, tubal, or peritoneal cancers. Bone density scan. This is done to screen for osteoporosis. Talk with your health care provider about your test results, treatment options, and if necessary, the need for more tests. Follow these instructions at home: Eating and drinking  Eat a diet that includes fresh fruits and vegetables, whole grains, lean protein, and low-fat dairy products. Take vitamin and mineral supplements as recommended by your health care provider. Do not drink alcohol if: Your health care provider tells you not to drink. You are pregnant, may be pregnant, or are planning to become pregnant. If you drink alcohol: Limit how much you have to 0-1 drink a day. Know how much alcohol is in your drink. In the [place] one drink equals one 12 oz bottle of beer (355 mL), one 5 oz glass of wine (148 mL), or one 1 oz glass of hard liquor (44 mL). Lifestyle Brush your teeth every morning and night with fluoride toothpaste. Floss one time each day. Exercise for at least 30 minutes 5 or more days each week. Do not use any products that contain nicotine or tobacco. These products include cigarettes, chewing tobacco, and vaping devices, such as e-cigarettes. If you need help quitting, ask your health care provider. Do not use drugs. If you are sexually active, practice safe sex. Use a condom or other form of protection to   prevent STIs. If you do not wish to become pregnant, use a form of birth control. If you plan to become pregnant, see your health care provider for a  prepregnancy visit. Take aspirin only as told by your health care provider. Make sure that you understand how much to take and what form to take. Work with your health care provider to find out whether it is safe and beneficial for you to take aspirin daily. Find healthy ways to manage stress, such as: Meditation, yoga, or listening to music. Journaling. Talking to a trusted person. Spending time with friends and family. Minimize exposure to UV radiation to reduce your risk of skin cancer. Safety Always wear your seat belt while driving or riding in a vehicle. Do not drive: If you have been drinking alcohol. Do not ride with someone who has been drinking. When you are tired or distracted. While texting. If you have been using any mind-altering substances or drugs. Wear a helmet and other protective equipment during sports activities. If you have firearms in your house, make sure you follow all gun safety procedures. Seek help if you have been physically or sexually abused. What's next? Visit your health care provider once a year for an annual wellness visit. Ask your health care provider how often you should have your eyes and teeth checked. Stay up to date on all vaccines. This information is not intended to replace advice given to you by your health care provider. Make sure you discuss any questions you have with your health care provider. Document Revised: 04/15/2021 Document Reviewed: 04/15/2021 Elsevier Patient Education  [place].

## 2022-08-23 NOTE — Progress Notes (Signed)
[facility] clinic  Provider:  Durenda Age DNP  Code Status:   Full Code  Goals of Care:     06/15/2022    3:17 PM  Advanced Directives  Does Patient Have a Medical Advance Directive? No  Would patient like information on creating a medical advance directive? No - Patient declined     Chief Complaint  Patient presents with   Establish Care    New patient here to establish care Patient has  medication bottles at initial appointment    Quality Metric Gaps    Patient is due for covid, flu, and second shingrix vaccine    HPI: Patient is a 51 y.o. female seen today to establish care with [facility]. She has a PMH of tricuspid regurgitation, hepatitis C, hepatitis B, IVDA pulmonary embolus, endocarditis (5329, complicated by cardiac tamponade, and COPD.  Her mother is 33 years old with hypertension, anxiety and prediabetes. Her father is 22 years old and healthy. She has 2 brothers who are healthy and a sister who died last year at 60 years old. She has a total of 6 children. She has twin daughters, 7 years old, and are both on drugs. She has 2 sons and one of them is, also, on drugs. She said that she is "happily divorced".  She admitted to still use marijuana and used some last night.   She has occasional anxiety for which she uses Valium 10 mg  BID PRN. She said that she uses it for sleep aid as well at night.  She is incontinent of urine. She was noted to have a bump on her right inner thigh, near the groin, which is tender and erythematous.  She has a history of tricuspid valve endocarditis (MSSA) in 2007 due to polysubstance abuse in the past.  Underwent surgical debridement of the tricuspid valve with drainage and PFO closure.  She also had drainage of pericardial effusion. TEE in 2014 showed wide open TR.  She had a R/L heart cath that showed no significant CAD, mild LV dysfunction.  Conservative management was recommended.  A repeat echo in 2015 showed moderate TR.  It was  recommended for continued antibiotic prophylaxis before dental work or endoscopies.  Past Medical History:  Diagnosis Date   Anxiety    ARF (acute renal failure) ([place])    a. 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   Arthritis    "knees" (05/09/2013)   Asthma    Cardiac arrest ([place])    a. Asystolic arrest 9242 in setting of TV endocarditis, pericardial tamponade.   Cardiac tamponade    a. s/p subxiphoid window 2007 in setting of TV endocarditis. b. 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 s/p closure in 2007.   Septic pulmonary embolism ([place])    a. During admission for TV endocarditis.   Severe tricuspid regurgitation    a. By TEE/cath 04/2013 - for outpt referral to [facility].    Tobacco abuse     Past Surgical History:  Procedure Laterality Date   CARDIAC CATHETERIZATION     multiple/notes 06/15/2006 (05/09/2013)   DILATION AND CURETTAGE OF UTERUS     "couple of those years ago; one a couple years ago after I lost a pregnancy" (05/09/2013)   LEFT AND RIGHT HEART CATHETERIZATION WITH CORONARY ANGIOGRAM N/A 05/08/2013   Procedure: LEFT AND RIGHT HEART CATHETERIZATION  WITH CORONARY ANGIOGRAM;  Surgeon: Burnell Blanks, MD;  Location: [facility] CATH LAB;  Service: Cardiovascular;  Laterality: N/A;   PATENT FORAMEN OVALE CLOSURE  04/22/2006   closed during tricuspid valve surgery   SUBXYPHOID PERICARDIAL WINDOW  05/07/2006   for large postoperative pericardial effusion    TEE WITHOUT CARDIOVERSION N/A 05/08/2013   Procedure: TRANSESOPHAGEAL ECHOCARDIOGRAM (TEE);  Surgeon: Lelon Perla, MD;  Location: [facility] ENDOSCOPY;  Service: Cardiovascular;  Laterality: N/A;   TRICUSPID VALVE SURGERY  04/22/2006   debridement of septal leaflet for MSSA bacterial endocarditis with closure PFO    Allergies  Allergen Reactions   Amoxicillin Diarrhea and Other (See Comments)    " I get very sick with this medication"   Latex Rash    " I'm allergic to latex, and it breaks by skin out in an itchy rash"   Penicillins Rash    Outpatient Encounter Medications as of 08/23/2022  Medication Sig   doxycycline (VIBRA-TABS) 100 MG tablet Take 1 tablet (100 mg total) by mouth 2 (two) times daily for 10 days.   famotidine (PEPCID) 20 MG tablet Take 20 mg by mouth daily as needed for heartburn.   rosuvastatin (CRESTOR) 20 MG tablet Take 1 tablet by mouth daily.   saccharomyces boulardii (FLORASTOR) 250 MG capsule Take 1 capsule (250 mg total) by mouth 2 (two) times daily.   sertraline (ZOLOFT) 100 MG tablet Take 1.5 tablets (150 mg total) by mouth daily.   [DISCONTINUED] diazepam (VALIUM) 10 MG tablet Take 1 tablet (10 mg total) by mouth 2 (two) times daily as needed for anxiety.   [DISCONTINUED] diazepam (VALIUM) 5 MG tablet Take 1 tablet (5 mg total) by mouth every 12 (twelve) hours as needed for anxiety.   diazepam (VALIUM) 5 MG tablet Take 1 tablet (5 mg total) by mouth every 12 (twelve) hours as needed for anxiety.   ipratropium (ATROVENT HFA) 17 MCG/ACT inhaler Inhale 2 puffs into the lungs every 6 (six) hours as needed for wheezing.   Omega-3 Fatty Acids (FISH OIL) 1000 MG CAPS  Take 3,000 mg by mouth daily.    SPIRIVA HANDIHALER 18 MCG inhalation capsule Place 1 capsule (18 mcg total) into inhaler and inhale once daily as directed.   [DISCONTINUED] Ascorbic Acid (VITAMIN C) 1000 MG tablet 2 daily   [DISCONTINUED] aspirin EC 81 MG tablet Take 81 mg by mouth daily. Swallow whole.   [DISCONTINUED] Biotin w/ Vitamins C & E (HAIR/SKIN/NAILS PO) Take by mouth.   [DISCONTINUED] cyanocobalamin 1000 MCG tablet Take 1,000 mcg by mouth daily.   [DISCONTINUED] diclofenac Sodium (VOLTAREN) 1 % GEL Apply topically 4 (four) times daily. As needed   [DISCONTINUED] ibuprofen (ADVIL) 800 MG tablet Take 1 tablet (800 mg total) by mouth every  8 (eight) hours as needed for mild pain.   [DISCONTINUED] Multiple Vitamins-Calcium (ONE-A-DAY WOMENS FORMULA PO) Take 1 tablet by mouth daily.   [DISCONTINUED] SPIRIVA HANDIHALER 18 MCG inhalation capsule Place 1 capsule (18 mcg total) into inhaler and inhale once daily as directed.   No facility-administered encounter medications on file as of 08/23/2022.    Review of Systems:  Review of Systems  Constitutional:  Negative for appetite change, chills, fatigue and fever.  HENT:  Negative for congestion, hearing loss, rhinorrhea and sore throat.   Eyes: Negative.   Respiratory:  Negative for shortness of breath and wheezing.   Cardiovascular:  Negative for chest pain, palpitations and leg swelling.  Gastrointestinal:  Negative for abdominal pain, constipation, diarrhea, nausea and vomiting.  Genitourinary:  Negative for dysuria.       Incontinent of urine  Musculoskeletal:  Negative for arthralgias, back pain and myalgias.  Skin:  Negative for color change, rash and wound.  Neurological:  Negative for dizziness, weakness and headaches.  Psychiatric/Behavioral:  Negative for behavioral problems. The patient is not nervous/anxious.     Health Maintenance  Topic Date Due   Medicare Annual Wellness (AWV)  Never done   COVID-19 Vaccine (3 -  Moderna risk series) 03/19/2020   Lung Cancer Screening  Never done   Zoster Vaccines- Shingrix (2 of 2) 12/17/2021   INFLUENZA VACCINE  06/01/2022   MAMMOGRAM  02/05/2023   TETANUS/TDAP  01/17/2024   PAP SMEAR-Modifier  01/31/2024   COLONOSCOPY (Pts 45-96yr Insurance coverage will need to be confirmed)  07/07/2031   Hepatitis C Screening  Completed   HIV Screening  Completed   HPV VACCINES  Aged Out    Physical Exam: Vitals:   08/23/22 1111  BP: (!) 144/80  Pulse: 87  Resp: 20  Temp: (!) 97.3 F (36.3 C)  SpO2: 98%  Weight: 197 lb (89.4 kg)  Height: 5' 3.6" (1.615 m)   Body mass index is 34.24 kg/m. Physical Exam Constitutional:      General: She is not in acute distress.    Appearance: She is obese.  HENT:     Head: Normocephalic and atraumatic.     Nose: Nose normal.     Mouth/Throat:     Mouth: Mucous membranes are moist.  Eyes:     Conjunctiva/sclera: Conjunctivae normal.  Cardiovascular:     Rate and Rhythm: Normal rate and regular rhythm.  Pulmonary:     Effort: Pulmonary effort is normal.     Breath sounds: Rales present.  Abdominal:     General: Bowel sounds are normal.     Palpations: Abdomen is soft.  Musculoskeletal:        General: Normal range of motion.     Cervical back: Normal range of motion.  Skin:    General: Skin is warm and dry.     Comments: Boil on right inner thigh, near the groin, which is erythematous and tender.  Neurological:     General: No focal deficit present.     Mental Status: She is alert and oriented to person, place, and time.  Psychiatric:        Mood and Affect: Mood normal.        Behavior: Behavior normal.        Thought Content: Thought content normal.        Judgment: Judgment normal.     Labs reviewed: Basic Metabolic Panel: Recent Labs    01/15/22 1125 08/23/22 1218  NA 139 142  K 4.8 4.6  CL 104 108  CO2 28 26  GLUCOSE 94 77  BUN 26* 25  CREATININE 0.87 0.74  CALCIUM 9.6 9.3  TSH 1.30  --     Liver Function Tests: Recent Labs    01/15/22 1125 08/23/22 1218  AST 18 12  ALT 19 12  ALKPHOS 74  --   BILITOT 0.6 0.6  PROT 6.9 6.4  ALBUMIN 4.3  --    No results for input(s): "LIPASE", "AMYLASE" in the last 8760 hours. No results for input(s): "AMMONIA" in the last 8760 hours. CBC: Recent Labs    01/15/22 1125  WBC 7.3  HGB 15.5*  HCT 46.5*  MCV 93.7  PLT 203.0   Lipid Panel: Recent Labs    01/15/22 1125  CHOL 115  HDL 44.00  LDLCALC 53  TRIG 91.0  CHOLHDL 3   Lab Results  Component Value Date   HGBA1C 5.7 01/15/2022    Procedures since last visit: No results found.  Assessment/Plan  1. Chronic obstructive pulmonary disease, unspecified COPD type ([place]) - SPIRIVA HANDIHALER 18 MCG inhalation capsule; Place 1 capsule (18 mcg total) into inhaler and inhale once daily as directed.  Dispense: 30 capsule; Refill: 5  2. Major depression, chronic -  mood is stable, continue Zoloft - CMP with eGFR([facility])  3. Prediabetes Lab Results  Component Value Date   HGBA1C 5.7 01/15/2022   -  instructed to have regular exercise and low carb diet  4. Anxiety -  agreed to decrease Valium from 10 mg  Q 12 hours  PRN to 5 mg Q 12 hours PRN - diazepam (VALIUM) 5 MG tablet; Take 1 tablet (5 mg total) by mouth every 12 (twelve) hours as needed for anxiety.  Dispense: 30 tablet; Refill: 0  5. Hyperlipidemia, unspecified hyperlipidemia type Lab Results  Component Value Date   CHOL 115 01/15/2022   HDL 44.00 01/15/2022   LDLCALC 53 01/15/2022   TRIG 91.0 01/15/2022   CHOLHDL 3 01/15/2022   -  continue Rosuvastatin  6. Gastroesophageal reflux disease without esophagitis -  stable, continue Famotidine  7. Visit for preventive health examination - HIV antibody (with reflex) - Hep C Antibody - Hep B Surface Antibody  8. History of hepatitis C - HIV antibody (with reflex) - Hep C Antibody - Hep B Surface Antibody - Sexually-Transmitted Infections (STIs)  Increased Risk Panel  9. Screening examination for sexually transmitted disease - HIV antibody (with reflex) - Hep C Antibody - Sexually-Transmitted Infections (STIs) Increased Risk Panel  10. COPD -  no wheezing, stable - SPIRIVA HANDIHALER 18 MCG inhalation capsule; Place 1 capsule (18 mcg total) into inhaler and inhale once daily as directed.  Dispense: 30 capsule; Refill: 5  11. Marijuana abuse -  last use was last night -  counseled  12. Boil -  keep skin clean and dry - doxycycline (VIBRA-TABS) 100 MG tablet; Take 1 tablet (100 mg total) by mouth 2 (two) times daily for 10 days.  Dispense: 20 tablet; Refill: 0 - saccharomyces boulardii (FLORASTOR) 250 MG capsule; Take 1 capsule (250 mg total) by mouth 2 (two) times daily.  Dispense: 60 capsule; Refill: 11  13. Smoking greater than 20 pack years - CT CHEST LUNG CA SCREEN LOW DOSE W/O CM; Future  14. Encounter for screening for osteoporosis - DG BONE DENSITY (DXA)   Labs/tests ordered:  CT CHEST LUNG CA SCREEN LOW DOSE W/O CM; Future - DG BONE DENSITY (DXA) -   HIV  antibody (with reflex) - Hep C Antibody - Sexually-Transmitted Infections (STIs) Increased Risk Panel -   CMP  Next appt:  in a month

## 2022-08-24 NOTE — Progress Notes (Signed)
CMP, normal (electrolytes,liver enzyme)

## 2022-08-24 NOTE — Progress Notes (Signed)
Hep B Ab is reactive. Did she have hep B vaccine? Liver enzymes are within normal.

## 2022-08-26 LAB — SEXUALLY-TRANSMITTED INFECTIONS (STIS) INCREASED RISK PANEL
CHLAMYDIA TRACHOMATIS RNA, TMA, UROGENITAL: NOT DETECTED
MYCOPLASMA GENITALIUM, rRNA, TMA: NOT DETECTED
NEISSERIA GONORRHOEAE RNA, TMA, UROGENITAL: NOT DETECTED
TRICHOMONAS VAGINALIS RNA, QL TMA: NOT DETECTED

## 2022-08-26 LAB — COMPLETE METABOLIC PANEL WITH GFR
AG Ratio: 1.8 (calc) (ref 1.0–2.5)
ALT: 12 U/L (ref 6–29)
AST: 12 U/L (ref 10–35)
Albumin: 4.1 g/dL (ref 3.6–5.1)
Alkaline phosphatase (APISO): 70 U/L (ref 37–153)
BUN: 25 mg/dL (ref 7–25)
CO2: 26 mmol/L (ref 20–32)
Calcium: 9.3 mg/dL (ref 8.6–10.4)
Chloride: 108 mmol/L (ref 98–110)
Creat: 0.74 mg/dL (ref 0.50–1.03)
Globulin: 2.3 g/dL (calc) (ref 1.9–3.7)
Glucose, Bld: 77 mg/dL (ref 65–99)
Potassium: 4.6 mmol/L (ref 3.5–5.3)
Sodium: 142 mmol/L (ref 135–146)
Total Bilirubin: 0.6 mg/dL (ref 0.2–1.2)
Total Protein: 6.4 g/dL (ref 6.1–8.1)
eGFR: 98 mL/min/{1.73_m2} (ref >=60)

## 2022-08-26 LAB — HIV ANTIBODY (ROUTINE TESTING W REFLEX): HIV 1&2 Ab, 4th Generation: NONREACTIVE

## 2022-08-26 LAB — HEPATITIS B SURFACE ANTIBODY,QUALITATIVE: Hep B S Ab: REACTIVE — AB

## 2022-08-26 LAB — HCV RNA,QUANTITATIVE REAL TIME PCR
HCV Quantitative Log: <1.18 Log IU/mL
HCV RNA, PCR, QN: <15 IU/mL

## 2022-08-26 LAB — HEPATITIS C ANTIBODY: Hepatitis C Ab: REACTIVE — AB

## 2022-08-27 ENCOUNTER — Ambulatory Visit (INDEPENDENT_AMBULATORY_CARE_PROVIDER_SITE_OTHER): Payer: Medicare Other | Admitting: Family Medicine

## 2022-08-27 ENCOUNTER — Encounter: Payer: Self-pay | Admitting: Family Medicine

## 2022-08-27 VITALS — BP 126/82 | HR 72 | Temp 97.5°F | Wt 194.4 lb

## 2022-08-27 DIAGNOSIS — Z79899 Other long term (current) drug therapy: Secondary | ICD-10-CM

## 2022-08-27 NOTE — Progress Notes (Addendum)
Discussion was witnessed by Luan Pulling.   Patient presents today for care follow-up.  Patient expressed interest in resuming Valium prescription.    Recently, patient reported some difficulty retrieving her prescription Valium through [facility] and had [facility].  I did send it on a limited basis.  But then we also received additional request from [facility] to refill the prescription.  This referral made behavior prompted concern for potential misuse or abuse.  A urine drug screen was ordered on patient, for 08/11/2019, but patient not complete this.  Patient was recently evaluated by another and establish care with a new PCP, Medina-Vargas, Monina C, NP.  On 08/23/2022.  Patient had full panel of lab work and other healthcare maintenance.  New PCP also prescribed patient Valium prescription 5 mg.  Patient also pick this up on the same day as per PDMP.    Inform patient that due to prior behavior regarding Valium and establishing with new PCP who was now prescribing this medication that this would be consistent with complete policy and recommended patient follow-up with new PCP for ongoing medical issues.  Patient was informed that she would receive a letter of dismissal from [facility].  Marny Lowenstein, MD, MS

## 2022-08-31 ENCOUNTER — Other Ambulatory Visit: Payer: Self-pay | Admitting: Adult Health

## 2022-08-31 DIAGNOSIS — F419 Anxiety disorder, unspecified: Secondary | ICD-10-CM

## 2022-08-31 DIAGNOSIS — R768 Other specified abnormal immunological findings in serum: Secondary | ICD-10-CM

## 2022-08-31 NOTE — Progress Notes (Signed)
I have ordered Hep B surface antigen and core antibody to check if Hep B is current. She has a history of hep B and C according to records. I also ordered urine drug screen for her Valium prescription done during the initial visit.

## 2022-09-02 ENCOUNTER — Other Ambulatory Visit (HOSPITAL_COMMUNITY): Payer: Self-pay

## 2022-09-02 ENCOUNTER — Other Ambulatory Visit: Payer: Self-pay | Admitting: Adult Health

## 2022-09-02 ENCOUNTER — Encounter: Payer: Self-pay | Admitting: Adult Health

## 2022-09-02 MED ORDER — FAMOTIDINE 20 MG PO TABS
20.0000 mg | ORAL_TABLET | Freq: Every day | ORAL | 11 refills | Status: DC | PRN
  Filled 2022-09-02: qty 30, 30d supply, fill #0

## 2022-09-02 MED ORDER — FISH OIL 1000 MG PO CAPS
3000.0000 mg | ORAL_CAPSULE | Freq: Every day | ORAL | 11 refills | Status: AC
  Filled 2022-09-02 – 2022-10-07 (×2): qty 90, 30d supply, fill #0

## 2022-09-03 ENCOUNTER — Telehealth (INDEPENDENT_AMBULATORY_CARE_PROVIDER_SITE_OTHER): Payer: Medicare Other | Admitting: Adult Health

## 2022-09-03 DIAGNOSIS — M25561 Pain in right knee: Secondary | ICD-10-CM | POA: Diagnosis not present

## 2022-09-03 DIAGNOSIS — J988 Other specified respiratory disorders: Secondary | ICD-10-CM

## 2022-09-03 DIAGNOSIS — M25562 Pain in left knee: Secondary | ICD-10-CM | POA: Diagnosis not present

## 2022-09-03 DIAGNOSIS — B9789 Other viral agents as the cause of diseases classified elsewhere: Secondary | ICD-10-CM

## 2022-09-03 DIAGNOSIS — G8929 Other chronic pain: Secondary | ICD-10-CM | POA: Diagnosis not present

## 2022-09-03 DIAGNOSIS — F1210 Cannabis abuse, uncomplicated: Secondary | ICD-10-CM | POA: Diagnosis not present

## 2022-09-03 NOTE — Progress Notes (Signed)
DATE:  09/03/2022 MRN:  [id number]  BIRTHDAY: [birth year]   Contact Information     Name Relation Home Work Mobile   Coble,Glenda Mother   [phone]   Yatzary, Merriweather [phone]  [phone]   shaneil, yazdi   [phone]        Code Status History     Date Active Date Inactive Code Status Order ID Comments User Context   05/08/2013 1952 05/09/2013 1213 Full Code [id number]  Kathleene Hazel, MD Inpatient        Chief Complaint  Patient presents with   Acute Visit    Patient reports cough w mucus,SOB, runny nose, chest tightness and states that some children that lives with her is sick as well.     HISTORY OF PRESENT ILLNESS: This is is a 51 year old female who had a video visit today for productive cough and SOB, runny nose.   She had friends who stayed in her house who were sick, probably virus. She had 2 days of sore throat fever and cough. She denies having any symptoms now except for phlegm which is colored tan to whitish. She said that she feels better now. She takes Vitamin C 1,000 mg  takes 2 tabs daily.    She has been having pain on bilateral knees when it gets cold ever since she turned 51 years old ( she is now 73 years old). Her father has rheumatoid arthritis and wants to know if she has it too. She wants to get tested for RA.  She is due for drug screening and wants to know why? She admitted to using Marijuana and the last time she had it was "several days ago". She was recently prescribed Valium.   PAST MEDICAL HISTORY:  Past Medical History:  Diagnosis Date   Anxiety    ARF (acute renal failure) (HCC)    a. 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   Arthritis    "knees" (05/09/2013)   Asthma    Cardiac arrest (HCC)    a. 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   Cardiac tamponade    a. s/p subxiphoid window 2007 in setting of TV endocarditis. b. She had  suffered a mild anoxic injury during her recent hospitalization felt to be related to hypoperfusion /notes 06/15/2006 (05/09/2013)   Chronic headaches    Depression    Endocarditis    a. 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 s/p closure in 2007.   Septic pulmonary embolism (HCC)    a. During admission for TV endocarditis.   Severe tricuspid regurgitation    a. By TEE/cath 04/2013 - for outpt referral to [facility].    Tobacco abuse      CURRENT MEDICATIONS: Reviewed  Patient's Medications  New Prescriptions   No medications on file  Previous Medications   DIAZEPAM (VALIUM) 5 MG TABLET    Take 1 tablet (5 mg total) by mouth every 12 (twelve) hours as needed for anxiety.   FAMOTIDINE (PEPCID) 20 MG TABLET    Take 1 tablet (20 mg total) by mouth daily as needed for heartburn.   IPRATROPIUM (ATROVENT HFA) 17 MCG/ACT INHALER    Inhale 2 puffs into the lungs every 6 (six) hours as needed for wheezing.   OMEGA-3 FATTY ACIDS (FISH OIL) 1000 MG CAPS    Take 3 capsules (3,000 mg total) by mouth daily.   ROSUVASTATIN (CRESTOR) 20 MG TABLET    Take 1 tablet by mouth daily.   SACCHAROMYCES BOULARDII (FLORASTOR) 250 MG CAPSULE    Take 1 capsule (250 mg total) by mouth 2 (two) times daily.   SERTRALINE (ZOLOFT) 100 MG TABLET    Take 1.5 tablets (150 mg total) by mouth daily.   SPIRIVA HANDIHALER 18  MCG INHALATION CAPSULE    Place 1 capsule (18 mcg total) into inhaler and inhale once daily as directed.  Modified Medications   No medications on file  Discontinued Medications   No medications on file     Allergies  Allergen Reactions   Amoxicillin Diarrhea and Other (See Comments)    " I get very sick with this medication"   Latex Rash    " I'm allergic to latex, and it breaks by skin out in an itchy rash"   Penicillins Rash     REVIEW OF SYSTEMS:  GENERAL: no change in appetite, no fatigue, no weight changes, no fever, chills or weakness SKIN: Denies rash, itching, wounds, ulcer sores, or nail abnormality EYES: Denies change in vision, dry eyes, eye pain, itching or discharge EARS: Denies change in hearing, ringing in ears, or earache NOSE: Denies nasal congestion or epistaxis MOUTH and THROAT: Denies oral discomfort, gingival pain or bleeding, pain from teeth or hoarseness   RESPIRATORY: has phlegm CARDIAC: no chest pain, edema or palpitations GI: no abdominal pain, diarrhea, constipation, heart burn, nausea or vomiting GU: has urinary incontinence MUSCULOSKELETAL: bilateral knee pain when it gets cold NEUROLOGICAL: Denies dizziness, syncope, numbness, or headache PSYCHIATRIC: Denies feeling of depression or anxiety. No report of hallucinations, insomnia, paranoia, or agitation    LABS/RADIOLOGY: Labs reviewed: Basic Metabolic Panel: Recent Labs    01/15/22 1125 08/23/22 1218  NA 139 142  K 4.8 4.6  CL 104 108  CO2 28 26  GLUCOSE 94 77  BUN 26* 25  CREATININE 0.87 0.74  CALCIUM 9.6 9.3   Liver Function Tests: Recent Labs    01/15/22 1125 08/23/22 1218  AST 18 12  ALT 19 12  ALKPHOS 74  --   BILITOT 0.6 0.6  PROT 6.9 6.4  ALBUMIN 4.3  --    No results for input(s): "LIPASE", "AMYLASE" in the last 8760 hours. No results for input(s): "AMMONIA" in the last 8760 hours. CBC: Recent Labs    01/15/22 1125  WBC 7.3  HGB 15.5*  HCT 46.5*  MCV 93.7   PLT 203.0   A1C: Invalid input(s): "A1C" Lipid Panel: Recent Labs    01/15/22 1125  HDL 44.00   Cardiac Enzymes: No results for input(s): "CKTOTAL", "CKMB", "CKMBINDEX", "TROPONINI" in the last 8760 hours. BNP: Invalid input(s): "POCBNP" CBG: No results for input(s): "GLUCAP" in the last 8760 hours.    No results found.  ASSESSMENT/PLAN:  1. Viral respiratory infection -  now resolved -  no fever, SOB nor chills  2. Chronic pain of both knees -   father has RA, wants to know if she has it - Rheumatoid Factor; Future - Cyclic citrul peptide antibody, IgG; Future - C-reactive Protein; Future - Sedimentation Rate; Future  3. Marijuana abuse -  the last one she had was several days ago, stated that she takes 1-2 puffs -  will get urine drug screen on 09/17/22 -  counseled     Time spent on non face to face visit:  17 minutes  The patient gave consent to this video visit. Explained to the patient the risk and privacy issue that was involved with this video visit.   The patient was advised to call back and ask for an in-person evaluation if the symptoms worsen or if the condition fails to improve.   Kenard Gower, NP [employer] [phone]

## 2022-09-03 NOTE — Patient Instructions (Signed)

## 2022-09-17 ENCOUNTER — Encounter: Payer: Self-pay | Admitting: Adult Health

## 2022-09-17 ENCOUNTER — Ambulatory Visit (INDEPENDENT_AMBULATORY_CARE_PROVIDER_SITE_OTHER): Payer: Medicare Other | Admitting: Adult Health

## 2022-09-17 VITALS — BP 120/87 | HR 87 | Temp 97.7°F | Resp 20 | Ht 64.0 in | Wt 196.0 lb

## 2022-09-17 DIAGNOSIS — E669 Obesity, unspecified: Secondary | ICD-10-CM | POA: Diagnosis not present

## 2022-09-17 DIAGNOSIS — F419 Anxiety disorder, unspecified: Secondary | ICD-10-CM | POA: Diagnosis not present

## 2022-09-17 DIAGNOSIS — J449 Chronic obstructive pulmonary disease, unspecified: Secondary | ICD-10-CM | POA: Diagnosis not present

## 2022-09-17 DIAGNOSIS — Z23 Encounter for immunization: Secondary | ICD-10-CM

## 2022-09-17 DIAGNOSIS — F32A Depression, unspecified: Secondary | ICD-10-CM

## 2022-09-17 DIAGNOSIS — Z6833 Body mass index (BMI) 33.0-33.9, adult: Secondary | ICD-10-CM

## 2022-09-17 DIAGNOSIS — L0292 Furuncle, unspecified: Secondary | ICD-10-CM

## 2022-09-17 DIAGNOSIS — R768 Other specified abnormal immunological findings in serum: Secondary | ICD-10-CM | POA: Diagnosis not present

## 2022-09-17 MED ORDER — MUPIROCIN 2 % EX OINT: 1.0000 | TOPICAL_OINTMENT | Freq: Two times a day (BID) | CUTANEOUS | 0 refills | Status: AC

## 2022-09-17 MED ORDER — DOXYCYCLINE HYCLATE 100 MG PO TABS: 100.0000 mg | ORAL_TABLET | Freq: Two times a day (BID) | ORAL | 0 refills | Status: AC

## 2022-09-17 NOTE — Progress Notes (Signed)
[facility] clinic  Provider: Kenard Gower  DNP  Code Status: Full Code  Goals of Care:     06/15/2022    3:17 PM  Advanced Directives  Does Patient Have a Medical Advance Directive? No  Would patient like information on creating a medical advance directive? No - Patient declined     Chief Complaint  Patient presents with   Medical Management of Chronic Issues    Patient is here for 36M F/U    HPI: Patient is a 51 y.o. female seen today for management of chronic issues. She was recently prescribed Doxycycline for her right inner thigh boil. However, she took only 4 pills and can't find the pill bottle. The boil is still erythematous and indurated. She is requesting for to have flu vaccine. She was seen crying when asked to sign the non-narcotic contract regarding Valium. She stated that she still smokes a cigarette/day. She stated that she does not feel depressed. She takes Sertraline for anxiety and depression.    Past Medical History:  Diagnosis Date   Anxiety    ARF (acute renal failure) (HCC)    a. 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   Arthritis    "knees" (05/09/2013)   Asthma    Cardiac arrest (HCC)    a. 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   Cardiac tamponade    a. s/p subxiphoid window 2007 in setting of TV endocarditis. b. 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   GERD (gastroesophageal reflux disease)    Hepatitis  B carrier (HCC)    Hepatitis C    History of bronchitis    History of drug abuse (HCC)    History of tricuspid valve disorder 04/16/2013   Hx: UTI (urinary tract infection)    Memory loss, short term    "since OHS in 2007" (05/09/2013)   PFO (patent foramen ovale)    a. s/p closure in 2007.   Septic pulmonary embolism (HCC)    a. During admission for TV endocarditis.   Severe tricuspid regurgitation    a. By TEE/cath 04/2013 - for outpt referral to [facility].    Tobacco abuse     Past Surgical History:  Procedure Laterality Date   CARDIAC CATHETERIZATION     multiple/notes 06/15/2006 (05/09/2013)   DILATION AND CURETTAGE OF UTERUS     "couple of those years ago; one a couple years ago after I lost a pregnancy" (05/09/2013)   LEFT AND RIGHT HEART CATHETERIZATION WITH CORONARY ANGIOGRAM N/A 05/08/2013   Procedure: LEFT AND RIGHT HEART CATHETERIZATION WITH CORONARY ANGIOGRAM;  Surgeon: Kathleene Hazel, MD;  Location[facility] C[facility] LAB;  Service: Cardiovascular;  Laterality: N/A;   PATENT FORAMEN OVALE CLOSURE  04/22/2006   closed during tricuspid valve surgery   SUBXYPHOID PERICARDIAL WINDOW  05/07/2006   for large postoperative pericardial effusion    TEE WITHOUT CARDIOVERSION N/A 05/08/2013   Procedure: TRANSESOPHAGEAL ECHOCARDIOGRAM (TEE);  Surgeon: Lewayne Bunting, MD;  Location: [facility] ENDOSCOPY;  Service: Cardiovascular;  Laterality: N/A;   TRICUSPID VALVE SURGERY  04/22/2006   debridement of septal leaflet for MSSA bacterial endocarditis with closure PFO    Allergies  Allergen Reactions   Amoxicillin Diarrhea and Other (See Comments)    " I get very sick with this medication"   Latex Rash    " I'm allergic to latex, and it breaks by skin out in an itchy rash"   Penicillins Rash    Outpatient Encounter Medications as of 09/17/2022  Medication Sig   doxycycline (VIBRA-TABS) 100 MG tablet Take 1 tablet (100 mg total) by mouth 2 (two) times daily for 10 days.   mupirocin ointment (BACTROBAN)  2 % Apply 1 Application topically 2 (two) times daily for 10 days.   diazepam (VALIUM) 5 MG tablet Take 1 tablet (5 mg total) by mouth every 12 (twelve) hours as needed for anxiety.   famotidine (PEPCID) 20 MG tablet Take 1 tablet (20 mg total) by mouth daily as needed for heartburn.   ipratropium (ATROVENT HFA) 17 MCG/ACT inhaler Inhale 2 puffs into the lungs every 6 (six) hours as needed for wheezing.   Omega-3 Fatty Acids (FISH OIL) 1000 MG CAPS Take 3 capsules (3,000 mg total) by mouth daily.   rosuvastatin (CRESTOR) 20 MG tablet Take 1 tablet by mouth daily.   saccharomyces boulardii (FLORASTOR) 250 MG capsule Take 1 capsule (250 mg total) by mouth 2 (two) times daily.   sertraline (ZOLOFT) 100 MG tablet Take 1.5 tablets (150 mg total) by mouth daily.   SPIRIVA HANDIHALER 18 MCG inhalation capsule Place 1 capsule (18 mcg total) into inhaler and inhale once daily as directed.   No facility-administered encounter medications on file as of 09/17/2022.    Review of Systems:  Review of Systems  Constitutional:  Negative for appetite change, chills, fatigue and fever.  HENT:  Negative for congestion, hearing loss, rhinorrhea and sore throat.   Eyes: Negative.   Respiratory:  Negative for cough, shortness of breath and wheezing.   Cardiovascular:  Negative for chest pain, palpitations and leg swelling.  Gastrointestinal:  Negative for abdominal pain, constipation, diarrhea, nausea and vomiting.  Genitourinary:  Negative for dysuria.  Musculoskeletal:  Negative for arthralgias, back pain and myalgias.  Skin:  Negative for color change, rash and wound.  Neurological:  Negative for dizziness, weakness and headaches.  Psychiatric/Behavioral:  Negative for behavioral problems. The patient is not nervous/anxious.     Health Maintenance  Topic Date Due   COVID-19 Vaccine (3 - Moderna risk series) 03/19/2020   Lung Cancer Screening  01/26/2021   Zoster Vaccines- Shingrix (2 of 2) 12/17/2021    INFLUENZA VACCINE  06/01/2022   MAMMOGRAM  02/05/2023   Medicare Annual Wellness (AWV)  06/16/2023   PAP SMEAR-Modifier  01/31/2024   COLONOSCOPY (Pts 45-65yrs Insurance coverage will need to be confirmed)  07/07/2031   Hepatitis C Screening  Completed   HIV Screening  Completed   HPV VACCINES  Aged Out    Physical Exam: Vitals:   09/17/22 1515  BP: 120/87  Pulse: 87  Resp: 20  Temp: 97.7 F (36.5 C)  SpO2: 96%  Weight: 196 lb (88.9 kg)  Height: 5\' 4"  (1.626 m)   Body mass index is 33.64 kg/m. Physical Exam Constitutional:      General: She is not in acute distress.    Appearance: She is obese.  HENT:     Head: Normocephalic and atraumatic.     Nose: Nose normal.  Mouth/Throat:     Mouth: Mucous membranes are moist.  Eyes:     Conjunctiva/sclera: Conjunctivae normal.  Cardiovascular:     Rate and Rhythm: Normal rate and regular rhythm.  Pulmonary:     Effort: Pulmonary effort is normal.     Breath sounds: Normal breath sounds.  Abdominal:     General: Bowel sounds are normal.     Palpations: Abdomen is soft.  Musculoskeletal:        General: Normal range of motion.     Cervical back: Normal range of motion.  Skin:    General: Skin is warm and dry.     Comments: Boil on right inner thigh  Neurological:     General: No focal deficit present.     Mental Status: She is alert and oriented to person, place, and time.  Psychiatric:        Mood and Affect: Mood normal.        Behavior: Behavior normal.        Thought Content: Thought content normal.        Judgment: Judgment normal.     Labs reviewed: Basic Metabolic Panel: Recent Labs    01/15/22 1125 08/23/22 1218  NA 139 142  K 4.8 4.6  CL 104 108  CO2 28 26  GLUCOSE 94 77  BUN 26* 25  CREATININE 0.87 0.74  CALCIUM 9.6 9.3  TSH 1.30  --    Liver Function Tests: Recent Labs    01/15/22 1125 08/23/22 1218  AST 18 12  ALT 19 12  ALKPHOS 74  --   BILITOT 0.6 0.6  PROT 6.9 6.4  ALBUMIN  4.3  --    No results for input(s): "LIPASE", "AMYLASE" in the last 8760 hours. No results for input(s): "AMMONIA" in the last 8760 hours. CBC: Recent Labs    01/15/22 1125  WBC 7.3  HGB 15.5*  HCT 46.5*  MCV 93.7  PLT 203.0   Lipid Panel: Recent Labs    01/15/22 1125  CHOL 115  HDL 44.00  LDLCALC 53  TRIG 91.0  CHOLHDL 3   Lab Results  Component Value Date   HGBA1C 5.7 01/15/2022    Procedures since last visit: No results found.  Assessment/Plan  1. Flu vaccine need - Flu Vaccine QUAD 6+ mos PF IM (Fluarix Quad PF)  2. Boil - doxycycline (VIBRA-TABS) 100 MG tablet; Take 1 tablet (100 mg total) by mouth 2 (two) times daily for 10 days.  Dispense: 20 tablet; Refill: 0 - mupirocin ointment (BACTROBAN) 2 %; Apply 1 Application topically 2 (two) times daily for 10 days.  Dispense: 20 g; Refill: 0  3. Chronic obstructive pulmonary disease, unspecified COPD type (HCC) -  no wheezing nor SOB -   continue Spiriva  5. Class 1 obesity with body mass index (BMI) of 33.0 to 33.9 in adult, unspecified obesity type, unspecified whether serious comorbidity present Body mass index is 33.64 kg/m. -  made a goal to lose at least 5 lbs in 3 months -  advised to exercise and eat low carb diet  6. Anxiety and depression -  mood is stable -  continue Sertraline and PRN Diazepam     Labs/tests ordered:  None  Next appt:  3 months

## 2022-09-17 NOTE — Patient Instructions (Signed)

## 2022-09-20 ENCOUNTER — Ambulatory Visit: Payer: Medicare Other | Admitting: Adult Health

## 2022-09-20 LAB — HEPATITIS B SURFACE ANTIGEN: Hepatitis B Surface Ag: NONREACTIVE

## 2022-09-20 LAB — HEPATITIS B CORE ANTIBODY, TOTAL: Hep B Core Total Ab: REACTIVE — AB

## 2022-09-24 ENCOUNTER — Encounter: Payer: Self-pay | Admitting: Adult Health

## 2022-09-27 ENCOUNTER — Ambulatory Visit
Admission: RE | Admit: 2022-09-27 | Discharge: 2022-09-27 | Disposition: A | Discharge status: Home / Self Care | Payer: Medicare Other | Source: Ambulatory Visit | Attending: Adult Health | Admitting: Adult Health

## 2022-09-27 DIAGNOSIS — F17210 Nicotine dependence, cigarettes, uncomplicated: Secondary | ICD-10-CM

## 2022-09-29 NOTE — Progress Notes (Signed)
CT chest negative for lung cancer.

## 2022-10-07 ENCOUNTER — Other Ambulatory Visit: Payer: Self-pay | Admitting: Adult Health

## 2022-10-07 ENCOUNTER — Other Ambulatory Visit (HOSPITAL_COMMUNITY): Payer: Self-pay

## 2022-10-07 DIAGNOSIS — F419 Anxiety disorder, unspecified: Secondary | ICD-10-CM

## 2022-10-08 ENCOUNTER — Other Ambulatory Visit (HOSPITAL_COMMUNITY): Payer: Self-pay

## 2022-10-11 ENCOUNTER — Other Ambulatory Visit (HOSPITAL_COMMUNITY): Payer: Self-pay

## 2022-10-11 ENCOUNTER — Other Ambulatory Visit: Payer: Self-pay | Admitting: Obstetrics and Gynecology

## 2022-10-11 ENCOUNTER — Other Ambulatory Visit: Payer: Self-pay | Admitting: Adult Health

## 2022-10-11 DIAGNOSIS — F419 Anxiety disorder, unspecified: Secondary | ICD-10-CM

## 2022-10-11 MED ORDER — DIAZEPAM 5 MG PO TABS
5.0000 mg | ORAL_TABLET | Freq: Two times a day (BID) | ORAL | 0 refills | Status: DC | PRN
  Filled 2022-10-11: qty 30, 15d supply, fill #0

## 2022-10-11 NOTE — Telephone Encounter (Signed)
Refill request. Medication last filled 08/23/2022. No refills. Contract on file and up to date.  Medication pended and sent to Kenard Gower, NP

## 2022-10-12 ENCOUNTER — Other Ambulatory Visit (HOSPITAL_COMMUNITY): Payer: Self-pay

## 2022-10-12 ENCOUNTER — Other Ambulatory Visit: Payer: Self-pay | Admitting: *Deleted

## 2022-10-12 ENCOUNTER — Ambulatory Visit
Admission: RE | Admit: 2022-10-12 | Discharge: 2022-10-12 | Disposition: A | Discharge status: Home / Self Care | Payer: Medicare Other | Source: Ambulatory Visit | Attending: Adult Health | Admitting: Adult Health

## 2022-10-12 DIAGNOSIS — M8589 Other specified disorders of bone density and structure, multiple sites: Secondary | ICD-10-CM | POA: Diagnosis not present

## 2022-10-12 DIAGNOSIS — F320 Major depressive disorder, single episode, mild: Secondary | ICD-10-CM

## 2022-10-12 DIAGNOSIS — Z780 Asymptomatic menopausal state: Secondary | ICD-10-CM | POA: Diagnosis not present

## 2022-10-12 MED ORDER — SERTRALINE HCL 100 MG PO TABS: 150.0000 mg | ORAL_TABLET | Freq: Every day | ORAL | 1 refills | Status: DC

## 2022-10-12 MED ORDER — FAMOTIDINE 20 MG PO TABS: 20.0000 mg | ORAL_TABLET | Freq: Every day | ORAL | 1 refills | Status: DC | PRN

## 2022-10-12 MED ORDER — ROSUVASTATIN CALCIUM 20 MG PO TABS: 20.0000 mg | ORAL_TABLET | Freq: Every day | ORAL | 1 refills | Status: DC

## 2022-10-12 NOTE — Telephone Encounter (Signed)
[facility] requested refill.  ? ?

## 2022-11-15 ENCOUNTER — Other Ambulatory Visit: Payer: Self-pay | Admitting: Adult Health

## 2022-11-15 DIAGNOSIS — F419 Anxiety disorder, unspecified: Secondary | ICD-10-CM

## 2022-11-16 MED ORDER — DIAZEPAM 5 MG PO TABS: 5.0000 mg | ORAL_TABLET | Freq: Two times a day (BID) | ORAL | 0 refills | Status: DC | PRN

## 2022-11-17 NOTE — Progress Notes (Signed)
-    Bone density showed osteopenia, recommending Calcium 1,000 mg daily and Vitamin D 800 IU daily - will need to stop smoking (if smoking) and stop alcohol use (if drinking)

## 2022-11-19 ENCOUNTER — Other Ambulatory Visit (HOSPITAL_COMMUNITY): Payer: Self-pay

## 2022-11-19 ENCOUNTER — Other Ambulatory Visit: Payer: Self-pay | Admitting: Adult Health

## 2022-11-19 DIAGNOSIS — F419 Anxiety disorder, unspecified: Secondary | ICD-10-CM

## 2022-11-19 NOTE — Telephone Encounter (Signed)
Incoming call received from patient in regards to rx request. Patient was informed that rx was sent to [place] on 11/16/22. Patient stated it was previously coming from [facility], however she had always wanted it to go to [place].  Patient plans to call [facility] to check status and will call us back if any additional action is needed from [place].

## 2022-12-06 ENCOUNTER — Ambulatory Visit (INDEPENDENT_AMBULATORY_CARE_PROVIDER_SITE_OTHER): Payer: 59 | Admitting: Adult Health

## 2022-12-06 ENCOUNTER — Ambulatory Visit: Payer: 59 | Admitting: Adult Health

## 2022-12-06 ENCOUNTER — Encounter: Payer: Self-pay | Admitting: Adult Health

## 2022-12-06 VITALS — BP 140/78 | HR 84 | Temp 97.3°F | Resp 18 | Ht 64.0 in | Wt 194.0 lb

## 2022-12-06 DIAGNOSIS — K219 Gastro-esophageal reflux disease without esophagitis: Secondary | ICD-10-CM | POA: Diagnosis not present

## 2022-12-06 DIAGNOSIS — J449 Chronic obstructive pulmonary disease, unspecified: Secondary | ICD-10-CM | POA: Diagnosis not present

## 2022-12-06 DIAGNOSIS — F330 Major depressive disorder, recurrent, mild: Secondary | ICD-10-CM

## 2022-12-06 NOTE — Progress Notes (Unsigned)
Location:  [facility] Service:   clinic  Allergies  Allergen Reactions   Amoxicillin Diarrhea and Other (See Comments)    " I get very sick with this medication"   Latex Rash    " I'm allergic to latex, and it breaks by skin out in an itchy rash"   Penicillins Rash    Chief Complaint  Patient presents with   Medical Management of Chronic Issues    Patient is here for a follow up for chronic conditions     HPI:  She is being seen for the management of her chronic illnesses including:  Chronic obstructive pulmonary disease unspecified COPD type  Gastroesophageal reflux disease without esophagitis  Mild episode of recurrent major depressive disorder. She denies any cough. Stated that her breathing is doing well.   Past Medical History:  Diagnosis Date   Anxiety    ARF (acute renal failure) ([place])    a. 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   Arthritis    "knees" (05/09/2013)   Asthma    Cardiac arrest ([place])    a. Asystolic arrest 2376 in setting of TV endocarditis, pericardial tamponade.   Cardiac tamponade    a. s/p subxiphoid window 2007 in setting of TV endocarditis. b. 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 s/p closure in 2007.   Septic pulmonary embolism ([place])    a. During admission for TV endocarditis.   Severe tricuspid regurgitation    a. By TEE/cath 04/2013 - for outpt referral to [facility].    Tobacco abuse     Past Surgical History:  Procedure Laterality Date   CARDIAC CATHETERIZATION     multiple/notes 06/15/2006 (05/09/2013)   DILATION AND CURETTAGE OF UTERUS     "couple of those years ago; one a couple years ago after I lost a pregnancy" (05/09/2013)   LEFT AND RIGHT HEART CATHETERIZATION WITH CORONARY ANGIOGRAM N/A 05/08/2013   Procedure: LEFT AND RIGHT HEART CATHETERIZATION WITH CORONARY ANGIOGRAM;  Surgeon: Burnell Blanks, MD;  Location: [facility] CATH LAB;  Service: Cardiovascular;  Laterality: N/A;   PATENT FORAMEN OVALE CLOSURE  04/22/2006   closed during tricuspid valve surgery   SUBXYPHOID PERICARDIAL WINDOW  05/07/2006   for large postoperative pericardial effusion    TEE WITHOUT CARDIOVERSION N/A 05/08/2013   Procedure: TRANSESOPHAGEAL ECHOCARDIOGRAM (TEE);  Surgeon: Lelon Perla, MD;  Location: [facility] ENDOSCOPY;  Service: Cardiovascular;  Laterality: N/A;   TRICUSPID VALVE SURGERY  04/22/2006   debridement of septal leaflet for MSSA bacterial endocarditis with closure PFO    Social History  Socioeconomic History   Marital status: Divorced    Spouse name: Not on file   Number of children: 6   Years of education: Not on file   Highest education level: Not on file  Occupational History   Occupation: disabled  Tobacco Use   Smoking status: Some Days    Packs/day: 0.75    Years: 30.00    Total pack years: 22.50    Types: Cigarettes   Smokeless tobacco: Never  Vaping Use   Vaping Use: Never used  Substance and Sexual Activity   Alcohol use: Yes    Alcohol/week: 7.0 standard drinks of alcohol    Types: 7 Cans of beer per week    Comment: at times has mixed drink   Drug use: Not  Currently    Types: Marijuana, Heroin, "Crack" cocaine, Cocaine    Comment: 05/09/2013 "<1 joint/day; last heroin & crack was before heart OR in 2007"   Sexual activity: Not Currently  Other Topics Concern   Not on file  Social History Narrative   Not on file   Social Determinants of Health   Financial Resource Strain: Low Risk  (06/15/2022)   Overall Financial Resource Strain (CARDIA)    Difficulty of Paying Living Expenses: Not hard at all  Food Insecurity: No Food Insecurity (06/15/2022)   Hunger Vital Sign    Worried About Running Out of Food in the Last Year: Never true    Ran Out of Food in the Last Year: Never true  Transportation Needs: No Transportation Needs (06/15/2022)   PRAPARE - Hydrologist (Medical): No    Lack of Transportation (Non-Medical): No  Physical Activity: Inactive (06/15/2022)   Exercise Vital Sign    Days of Exercise per Week: 0 days    Minutes of Exercise per Session: 0 min  Stress: No Stress Concern Present (06/15/2022)   [place]    Feeling of Stress : Not at all  Social Connections: Moderately Isolated (06/15/2022)   Social Connection and Isolation Panel [NHANES]    Frequency of Communication with Friends and Family: Twice a week    Frequency of Social Gatherings with Friends and Family: Twice a week    Attends Religious Services: More than 4 times per year    Active Member of [employer] or Organizations: No    Attends Archivist Meetings: Never    Marital Status: Divorced  Human resources officer Violence: Not At Risk (06/15/2022)   Humiliation, Afraid, Rape, and Kick questionnaire    Fear of Current or Ex-Partner: No    Emotionally Abused: No    Physically Abused: No    Sexually Abused: No   Family History  Problem Relation Age of Onset   Drug abuse Mother    Depression Mother    COPD Mother    Arthritis Mother    Hyperlipidemia Mother    Alcohol  abuse Father    Arthritis Father    Drug abuse Sister        at age 64   Depression Sister    Hyperlipidemia Brother    Depression Daughter    Alcohol abuse Daughter    Anxiety disorder Daughter    Drug abuse Daughter    Depression Daughter    Anxiety disorder Daughter    Drug abuse Daughter    Depression Daughter    Anxiety disorder Daughter    Drug abuse Daughter    Bipolar disorder Daughter  Schizophrenia Daughter    Drug abuse Son    Anxiety disorder Son    Depression Son    Anxiety disorder Son    Drug abuse Son    Depression Maternal Grandmother    Heart disease Maternal Grandmother    Alcohol abuse Maternal Grandfather    Alzheimer's disease Maternal Grandfather    Arthritis Paternal Grandmother    Early death Paternal Grandfather    Alcohol abuse Paternal Grandfather    Ovarian cancer Cousin    Cancer Neg Hx    Hypertension Neg Hx    Kidney disease Neg Hx    Learning disabilities Neg Hx    Stroke Neg Hx       VITAL SIGNS BP (!) 140/78   Pulse 84   Temp (!) 97.3 F (36.3 C)   Resp 18   Ht 5\' 4"  (1.626 m)   Wt 194 lb (88 kg)   LMP 03/23/2020   SpO2 99%   BMI 33.30 kg/m   Outpatient Encounter Medications as of 12/06/2022  Medication Sig   diazepam (VALIUM) 5 MG tablet Take 1 tablet (5 mg total) by mouth every 12 (twelve) hours as needed for anxiety.   famotidine (PEPCID) 20 MG tablet Take 1 tablet (20 mg total) by mouth daily as needed for heartburn.   ipratropium (ATROVENT HFA) 17 MCG/ACT inhaler Inhale 2 puffs into the lungs every 6 (six) hours as needed for wheezing.   Omega-3 Fatty Acids (FISH OIL) 1000 MG CAPS Take 3 capsules by mouth daily.   rosuvastatin (CRESTOR) 20 MG tablet Take 1 tablet by mouth daily.   saccharomyces boulardii (FLORASTOR) 250 MG capsule Take 1 capsule (250 mg total) by mouth 2 (two) times daily.   sertraline (ZOLOFT) 100 MG tablet Take 1 and 1/2 tablets (150 mg total) by mouth daily.   SPIRIVA HANDIHALER 18 MCG inhalation  capsule Place 1 capsule (18 mcg total) into inhaler and inhale once daily as directed.   No facility-administered encounter medications on file as of 12/06/2022.     SIGNIFICANT DIAGNOSTIC EXAMS  Review of Systems  Constitutional:  Negative for malaise/fatigue.  Respiratory:  Negative for cough and shortness of breath.   Cardiovascular:  Negative for chest pain, palpitations and leg swelling.  Gastrointestinal:  Negative for abdominal pain, constipation and heartburn.  Musculoskeletal:  Negative for back pain, joint pain and myalgias.  Skin: Negative.   Neurological:  Negative for dizziness.  Psychiatric/Behavioral:  The patient is nervous/anxious and has insomnia.    Physical Exam Constitutional:      General: She is not in acute distress.    Appearance: She is well-developed. She is obese. She is not diaphoretic.  Neck:     Thyroid: No thyromegaly.  Cardiovascular:     Rate and Rhythm: Normal rate and regular rhythm.     Heart sounds: Normal heart sounds.  Pulmonary:     Effort: Pulmonary effort is normal. No respiratory distress.     Breath sounds: Normal breath sounds.  Abdominal:     General: Bowel sounds are normal. There is no distension.     Palpations: Abdomen is soft.     Tenderness: There is no abdominal tenderness.  Musculoskeletal:        General: Normal range of motion.     Cervical back: Neck supple.     Right lower leg: No edema.     Left lower leg: No edema.  Lymphadenopathy:     Cervical: No cervical adenopathy.  Skin:  General: Skin is warm and dry.  Neurological:     Mental Status: She is alert and oriented to person, place, and time.  Psychiatric:        Mood and Affect: Mood normal.       ASSESSMENT/ PLAN:  TODAY  Chronic obstructive pulmonary disease unspecified COPD type Gastroesophageal reflux disease without esophagitis Mild episode of recurrent major depressive disorder  Will continue her current medications She has been told  that she take motril 200 or 400 mg as needed 1 or 2 times weekly for her chronic pain.  She is to return to clinic in 6 months or sooner if necessary    Synthia Innocent NP [facility] Adult Medicine  call [phone]

## 2022-12-06 NOTE — Patient Instructions (Signed)
Take one or two 200 mg motrin one or two times weekly for chest pain.  You need to eat 3 times daily to help prevent headaches Use ice pack for headaches

## 2022-12-14 ENCOUNTER — Other Ambulatory Visit: Payer: Self-pay | Admitting: Adult Health

## 2022-12-14 ENCOUNTER — Other Ambulatory Visit (HOSPITAL_COMMUNITY): Payer: Self-pay

## 2022-12-14 DIAGNOSIS — F419 Anxiety disorder, unspecified: Secondary | ICD-10-CM

## 2022-12-14 NOTE — Telephone Encounter (Signed)
Patient is requesting a refill of the following medications: Requested Prescriptions   Pending Prescriptions Disp Refills   diazepam (VALIUM) 5 MG tablet [Pharmacy Med Name: DIAZEPAM  5MG  TAB] 30 tablet     Sig: TAKE 1 TABLET BY MOUTH EVERY 12  HOURS AS NEEDED FOR ANXIETY    Date of last refill: 11/16/22  Refill amount: 30  Treatment agreement date: Notation made on pending appointment for August 2024

## 2022-12-15 ENCOUNTER — Ambulatory Visit: Payer: 59 | Admitting: Physician Assistant

## 2022-12-15 NOTE — Progress Notes (Deleted)
Office Visit    Patient Name: Tina Fitzgerald Date of Encounter: 12/15/2022  PCP:  Nickola Major, NP   [facility]  Cardiologist:  Dorris Carnes, MD  Advanced Practice Provider:  No care team member to display Electrophysiologist:  None   HPI    Tina Fitzgerald is a 52 y.o. 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  TEE completed in 2014 which showed wide-open TR and heart catheterization completed with no significant CAD.  Seen by Dr. Roxy Manns and nonsurgical conservative management was recommended.  Last echo completed 12/2020 with EF 60 to 65% and moderately elevated PA systolic pressures with severely dilated RA, moderately enlarged RV, moderately elevated PASP, moderate to severe tricuspid regurgitation.  Ms. Eustice was seen by Dr. Harrington Challenger 12/23/2020 for annual follow-up.  During that visit the patient stated she was feeling good and had cut down to 2 cigarettes a day.  She states that her breathing was good at that time.  She was seen by Ambrose Pancoast, NP 01/2022 and at that time she was feeling well without any cardiac issues.  Compliant with medications.  Quit smoking for a while but then was back to 5 cigarettes a day.  Currently being followed by a pulmonologist and states she has difficulty driving due to increased panic attacks and declines referral for therapy.  Today, she ***  Past Medical History    Past Medical  History:  Diagnosis Date   Anxiety    ARF (acute renal failure) ([place])    a. 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   Arthritis    "knees" (05/09/2013)   Asthma    Cardiac arrest ([place])    a. Asystolic arrest [id number] in setting of TV endocarditis, pericardial tamponade.   Cardiac tamponade    a. s/p subxiphoid window 2007 in setting of TV endocarditis. b. 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 s/p closure in 2007.   Septic pulmonary embolism ([place])    a. During admission for TV endocarditis.   Severe tricuspid regurgitation    a. By TEE/cath 04/2013 - for outpt referral to [facility].  Tobacco abuse    Past Surgical History:  Procedure Laterality Date   CARDIAC CATHETERIZATION     multiple/notes 06/15/2006 (05/09/2013)   DILATION AND CURETTAGE OF UTERUS     "couple of those years ago; one a couple years ago after I lost a pregnancy" (05/09/2013)   LEFT AND RIGHT HEART CATHETERIZATION WITH CORONARY ANGIOGRAM N/A 05/08/2013   Procedure: LEFT AND RIGHT HEART CATHETERIZATION WITH CORONARY ANGIOGRAM;  Surgeon: Burnell Blanks, MD;  Location: [facility] CATH LAB;  Service:  Cardiovascular;  Laterality: N/A;   PATENT FORAMEN OVALE CLOSURE  04/22/2006   closed during tricuspid valve surgery   SUBXYPHOID PERICARDIAL WINDOW  05/07/2006   for large postoperative pericardial effusion    TEE WITHOUT CARDIOVERSION N/A 05/08/2013   Procedure: TRANSESOPHAGEAL ECHOCARDIOGRAM (TEE);  Surgeon: Lelon Perla, MD;  Location: [facility] ENDOSCOPY;  Service: Cardiovascular;  Laterality: N/A;   TRICUSPID VALVE SURGERY  04/22/2006   debridement of septal leaflet for MSSA bacterial endocarditis with closure PFO    Allergies  Allergies  Allergen Reactions   Amoxicillin Diarrhea and Other (See Comments)    " I get very sick with this medication"   Latex Rash    " I'm allergic to latex, and it breaks by skin out in an itchy rash"   Penicillins Rash     EKGs/Labs/Other Studies Reviewed:   The following studies were reviewed today:  Recent Studies: 2D echo 12/2019: EF 60-65%, no RWMA, severely dilated RA, moderate to severe tricuspid regurgitation 2D echo 12/2020: EF 60-65%, no RWMA, moderately dilated RV, moderately elevated PA systolic pressure, severely dilated RA R/LHC 05/2013: Revealed no evidence of CAD, mild global LV systolic dysfunction, and TV regurgitation    EKG:  EKG is *** ordered today.  The ekg ordered today demonstrates ***  Recent Labs: 01/15/2022: Hemoglobin 15.5; Platelets 203.0; TSH 1.30 08/23/2022: ALT 12; BUN 25; Creat 0.74; Potassium 4.6; Sodium 142  Recent Lipid Panel    Component Value Date/Time   CHOL 115 01/15/2022 1125   CHOL 127 10/10/2020 1540   TRIG 91.0 01/15/2022 1125   HDL 44.00 01/15/2022 1125   HDL 44 10/10/2020 1540   CHOLHDL 3 01/15/2022 1125   VLDL 18.2 01/15/2022 1125   LDLCALC 53 01/15/2022 1125   LDLCALC 60 10/10/2020 1540    Risk Assessment/Calculations:  {Does this patient have ATRIAL FIBRILLATION?:[phone]}  Home Medications   No outpatient medications have been marked as taking for the 12/15/22 encounter (Appointment)  with Elgie Collard, PA-C.     Review of Systems   ***   All other systems reviewed and are otherwise negative except as noted above.  Physical Exam    VS:  LMP 03/23/2020  , BMI There is no height or weight on file to calculate BMI.  Wt Readings from Last 3 Encounters:  12/06/22 194 lb (88 kg)  09/17/22 196 lb (88.9 kg)  08/27/22 194 lb 6.4 oz (88.2 kg)     GEN: Well nourished, well developed, in no acute distress. HEENT: normal. Neck: Supple, no JVD, carotid bruits, or masses. Cardiac: ***RRR, no murmurs, rubs, or gallops. No clubbing, cyanosis, edema.  ***Radials/PT 2+ and equal bilaterally.  Respiratory:  ***Respirations regular and unlabored, clear to auscultation bilaterally. GI: Soft, nontender, nondistended. MS: No deformity or atrophy. Skin: Warm and dry, no rash. Neuro:  Strength and sensation are intact. Psych: Normal affect.  Assessment & Plan    Tricuspid regurgitation with RV dysfunction and pulmonary hypertension Hyperlipidemia Tobacco abuse COPD  No  BP recorded.  {Refresh Note OR Click here to enter BP  :1}***      Disposition: Follow up {follow up:15908} with Dorris Carnes, MD or APP.  Signed, Elgie Collard, PA-C 12/15/2022, 1:03 PM [facility]

## 2022-12-16 ENCOUNTER — Other Ambulatory Visit: Payer: Self-pay | Admitting: Adult Health

## 2022-12-17 ENCOUNTER — Encounter: Payer: Self-pay | Admitting: Interventional Cardiology

## 2022-12-17 ENCOUNTER — Ambulatory Visit: Payer: 59 | Attending: Physician Assistant | Admitting: Interventional Cardiology

## 2022-12-17 VITALS — BP 130/80 | HR 81 | Ht 64.0 in | Wt 188.8 lb

## 2022-12-17 DIAGNOSIS — Z720 Tobacco use: Secondary | ICD-10-CM

## 2022-12-17 DIAGNOSIS — E785 Hyperlipidemia, unspecified: Secondary | ICD-10-CM | POA: Diagnosis not present

## 2022-12-17 DIAGNOSIS — I071 Rheumatic tricuspid insufficiency: Secondary | ICD-10-CM

## 2022-12-17 DIAGNOSIS — I700 Atherosclerosis of aorta: Secondary | ICD-10-CM

## 2022-12-17 DIAGNOSIS — R072 Precordial pain: Secondary | ICD-10-CM

## 2022-12-17 NOTE — Patient Instructions (Addendum)
Medication Instructions:  Your physician recommends that you continue on your current medications as directed. Please refer to the Current Medication list given to you today.  *If you need a refill on your cardiac medications before your next appointment, please call your pharmacy*   Lab Work: none If you have labs (blood work) drawn today and your tests are completely normal, you will receive your results only by: [place] (if you have MyChart) OR A paper copy in the mail If you have any lab test that is abnormal or we need to change your treatment, we will call you to review the results.   Testing/Procedures: none   Follow-Up: At [facility], you and your health needs are our priority.  As part of our continuing mission to provide you with exceptional heart care, we have created designated Provider Care Teams.  These Care Teams include your primary Cardiologist (physician) and Advanced Practice Providers (APPs -  Physician Assistants and Nurse Practitioners) who all work together to provide you with the care you need, when you need it.  We recommend signing up for the patient portal called "MyChart".  Sign up information is provided on this After Visit Summary.  MyChart is used to connect with patients for Virtual Visits (Telemedicine).  Patients are able to view lab/test results, encounter notes, upcoming appointments, etc.  Non-urgent messages can be sent to your provider as well.   To learn more about what you can do with MyChart, go to [email or web address].    Your next appointment:   April/May 2024  Provider:   Dorris Carnes, MD     Other Instructions

## 2022-12-17 NOTE — Progress Notes (Signed)
Cardiology Office Note   Date:  12/17/2022   ID:  MYLEI MEIKLE, DOB 1971-03-11, MRN [id number]  PCP:  Nickola Major, NP    No chief complaint on file.  Tricuspid regurgitation  Wt Readings from Last 3 Encounters:  12/17/22 188 lb 12.8 oz (85.6 kg)  12/06/22 194 lb (88 kg)  09/17/22 196 lb (88.9 kg)       History of Present Illness: Tina Fitzgerald is a 53 y.o. female  with a history of tricuspid regurgitation, hepatitis C, hep B, IVDA pulmonary embolus, endocarditis ([id number], complicated by cardiac tamponade), COPD.   The patient was previously followed by Darlina Guys.  She was last seen in clinic in 2016 The pt has a history of tricuspid valve endocarditis (MSSA) in 2007 due to of polysubstance abuse in the past.   Underwent surgical debridement of the tricuspid valve with drainage  And PFO closure   She also had drainage of  pericardial effusion- window.     IN 2014 she underwent TEE  Which showed wide open TR   She also had a R / L heart cath that showed no signifi CAD   Mild LV dysfunction  TEE confirmed severe/wide open TR in 2014  Seen by Dr. Roxy Manns and cardiac surgery At the time the pt was having UTI, GYn infections   Recommendation was for conservative / meidcal therapy.     Repeat Echo in 2015 showed moderate TR.  Again recommendation was for continued antibiotic prophylaxis before dental work or endoscopies.  Had chest pain several days ago.  Felt like a stabbing pain and it felt short of breath.  Has had a lot of stress.  A dog bit her mother.  She is having to do more housework.    Past Medical History:  Diagnosis Date   Anxiety    ARF (acute renal failure) ([place])    a. 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   Arthritis    "knees" (05/09/2013)   Asthma    Cardiac arrest ([place])    a. Asystolic arrest [id number] in setting of TV endocarditis, pericardial tamponade.   Cardiac tamponade    a. s/p subxiphoid window 2007 in  setting of TV endocarditis. b. 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   GERD (gastroesophageal reflux disease)    Hepatitis B carrier (HCC)    Hepatitis C    History of bronchitis    History of drug abuse ([place])    History of tricuspid valve disorder 04/16/2013   Hx: UTI (urinary tract infection)    Memory loss, short term    "since OHS in 2007" (05/09/2013)   PFO (patent foramen ovale)    a. s/p closure in 2007.   Septic pulmonary embolism ([place])    a. During admission for TV endocarditis.   Severe tricuspid regurgitation    a. By TEE/cath 04/2013 - for outpt referral to [facility].    Tobacco abuse     Past Surgical History:  Procedure Laterality Date  CARDIAC CATHETERIZATION     multiple/notes 06/15/2006 (05/09/2013)   DILATION AND CURETTAGE OF UTERUS     "couple of those years ago; one a couple years ago after I lost a pregnancy" (05/09/2013)   LEFT AND RIGHT HEART CATHETERIZATION WITH CORONARY ANGIOGRAM N/A 05/08/2013   Procedure: LEFT AND RIGHT HEART CATHETERIZATION WITH CORONARY ANGIOGRAM;  Surgeon: Burnell Blanks, MD;  Location: [facility] CATH LAB;  Service: Cardiovascular;  Laterality: N/A;   PATENT FORAMEN OVALE CLOSURE  04/22/2006   closed during tricuspid valve surgery   SUBXYPHOID PERICARDIAL WINDOW  05/07/2006   for large postoperative pericardial effusion    TEE WITHOUT CARDIOVERSION N/A 05/08/2013   Procedure: TRANSESOPHAGEAL ECHOCARDIOGRAM (TEE);  Surgeon: Lelon Perla, MD;  Location: [facility] ENDOSCOPY;   Service: Cardiovascular;  Laterality: N/A;   TRICUSPID VALVE SURGERY  04/22/2006   debridement of septal leaflet for MSSA bacterial endocarditis with closure PFO     Current Outpatient Medications  Medication Sig Dispense Refill   diazepam (VALIUM) 5 MG tablet TAKE 1 TABLET BY MOUTH EVERY 12  HOURS AS NEEDED FOR ANXIETY 30 tablet 0   famotidine (PEPCID) 20 MG tablet Take 1 tablet (20 mg total) by mouth daily as needed for heartburn. 90 tablet 1   ipratropium (ATROVENT HFA) 17 MCG/ACT inhaler Inhale 2 puffs into the lungs every 6 (six) hours as needed for wheezing.     Omega-3 Fatty Acids (FISH OIL) 1000 MG CAPS Take 3 capsules by mouth daily. 90 capsule 11   rosuvastatin (CRESTOR) 20 MG tablet Take 1 tablet by mouth daily. 90 tablet 1   saccharomyces boulardii (FLORASTOR) 250 MG capsule Take 1 capsule (250 mg total) by mouth 2 (two) times daily. 60 capsule 11   sertraline (ZOLOFT) 100 MG tablet Take 1 and 1/2 tablets (150 mg total) by mouth daily. 135 tablet 1   SPIRIVA HANDIHALER 18 MCG inhalation capsule Place 1 capsule (18 mcg total) into inhaler and inhale once daily as directed. 30 capsule 5   No current facility-administered medications for this visit.    Allergies:   Amoxicillin, Latex, and Penicillins    Social History:  The patient  reports that she has been smoking cigarettes. She has a 22.50 pack-year smoking history. She has never used smokeless tobacco. She reports current alcohol use of about 7.0 standard drinks of alcohol per week. She reports that she does not currently use drugs after having used the following drugs: Marijuana, Heroin, "Crack" cocaine, and Cocaine.   Family History:  The patient's family history includes Alcohol abuse in her daughter, father, maternal grandfather, and paternal grandfather; Alzheimer's disease in her maternal grandfather; Anxiety disorder in her daughter, daughter, daughter, son, and son; Arthritis in her father, mother, and paternal  grandmother; Bipolar disorder in her daughter; COPD in her mother; Depression in her daughter, daughter, daughter, maternal grandmother, mother, sister, and son; Drug abuse in her daughter, daughter, daughter, mother, sister, son, and son; Early death in her paternal grandfather; Heart disease in her maternal grandmother; Hyperlipidemia in her brother and mother; Ovarian cancer in her cousin; Schizophrenia in her daughter.    ROS:  Please see the history of present illness.   Otherwise, review of systems are positive for stress.   All other systems are reviewed and negative.    PHYSICAL EXAM: VS:  BP 130/80   Pulse 81   Ht 5' 4"$  (1.626 m)   Wt 188 lb 12.8 oz (85.6 kg)   LMP 03/23/2020   SpO2 95%  BMI 32.41 kg/m  , BMI Body mass index is 32.41 kg/m. GEN: Well nourished, well developed, in no acute distress HEENT: normal Neck: no JVD, carotid bruits, or masses Cardiac: RRR; 1/6 systolic murmur, no rubs, or gallops,no edema  Respiratory:  clear to auscultation bilaterally, normal work of breathing GI: soft, nontender, nondistended, + BS MS: no deformity or atrophy Skin: warm and dry, no rash Neuro:  Strength and sensation are intact Psych: euthymic mood, full affect   EKG:   The ekg ordered today demonstrates NSR, IRBBB   Recent Labs: 01/15/2022: Hemoglobin 15.5; Platelets 203.0; TSH 1.30 08/23/2022: ALT 12; BUN 25; Creat 0.74; Potassium 4.6; Sodium 142   Lipid Panel    Component Value Date/Time   CHOL 115 01/15/2022 1125   CHOL 127 10/10/2020 1540   TRIG 91.0 01/15/2022 1125   HDL 44.00 01/15/2022 1125   HDL 44 10/10/2020 1540   CHOLHDL 3 01/15/2022 1125   VLDL 18.2 01/15/2022 1125   LDLCALC 53 01/15/2022 1125   LDLCALC 60 10/10/2020 1540     Other studies Reviewed: Additional studies/ records that were reviewed today with results demonstrating: LDL 53.   ASSESSMENT AND PLAN:  Chest pain: Sx are getting better of late,but are worse with the recent family stress.   Relationship with her mother has been difficult of late.  SHe is looking at making changes in her life to reduce stress.  Pain also got worse once when she tried to lift her 50-year-old grandson.  EKG is reassuring.  No ischemic changes.  Would hold off on any ischemic testing right now.  If symptoms persist, could reconsider stress testing. Tricuspid regurgitation: severe.  No LE edema.  Appears euvolemic.  Tobacco abuse: Still smoking.  Trying to quit.  Quit for a brief time using patches, but then she had a rash.  Will see if she can get prescription nicorette.  She does not want to take Chantix due to mental health issues.  She did not tolerate Wellbutrin.  Will check with Pharm.D. about prescription Nicorette gum. Hyperlipidemia: LDL 53. COntinue rosuvastatin.  COPD: Needs to stop smoking.  Aortic atherosclerosis: healthy diet.    Current medicines are reviewed at length with the patient today.  The patient concerns regarding her medicines were addressed.  The following changes have been made:  No change  Labs/ tests ordered today include:  No orders of the defined types were placed in this encounter.   Recommend 150 minutes/week of aerobic exercise Low fat, low carb, high fiber diet recommended  Disposition:   FU with Dr. Harrington Challenger as scheduled in a few months.  We discussed that if her chest discomfort persists, she should let us know.  If she has chest discomfort that does not resolve, she needs to call 911 and be evaluated.   Signed, Larae Grooms, MD  12/17/2022 3:53 PM    [facility] [address], [place], [place]  [address] Phone: [phone]; Fax: [phone]

## 2022-12-20 ENCOUNTER — Other Ambulatory Visit: Payer: Self-pay | Admitting: Adult Health

## 2022-12-20 ENCOUNTER — Telehealth: Payer: Self-pay | Admitting: Pharmacist

## 2022-12-20 NOTE — Telephone Encounter (Signed)
Tina Booze, MD  Fay Records, MD; Diarra Ceja, Harlon Flor, [place], I saw her for atypical chest pain.  Sx were improving so I did not order any stress testing.   Jinny Blossom, Is there a way to get her prescription nicorette so it is covered by insurance

## 2022-12-20 NOTE — Telephone Encounter (Signed)
Dosing of nicotine gum determined by time to first cigarette of the day. 20m dose used if pt smokes first cigarette within 30 minutes of waking, otherwise 223mstrength is recommended.  Called pt and left message. Will try sending rx to pharmacy once rx dose is confirmed.

## 2022-12-21 ENCOUNTER — Other Ambulatory Visit (HOSPITAL_COMMUNITY): Payer: Self-pay

## 2022-12-21 MED ORDER — NICOTINE POLACRILEX 4 MG MT GUM: 4.0000 mg | CHEWING_GUM | OROMUCOSAL | 0 refills | Status: DC | PRN

## 2022-12-21 NOTE — Telephone Encounter (Signed)
Called pt again, she smokes her first cigarette within 30 minutes of waking up. Rx sent to pharmacy for nicotine gum 106m dosing. Advised pt if her insurance does not cover that it is available OTC as well.

## 2022-12-21 NOTE — Addendum Note (Signed)
Addended by: Sharee Holster R on: 12/21/2022 01:00 PM   Modules accepted: Orders

## 2022-12-28 ENCOUNTER — Other Ambulatory Visit: Payer: Self-pay | Admitting: Adult Health

## 2022-12-28 DIAGNOSIS — G8929 Other chronic pain: Secondary | ICD-10-CM

## 2022-12-28 DIAGNOSIS — J449 Chronic obstructive pulmonary disease, unspecified: Secondary | ICD-10-CM

## 2022-12-28 DIAGNOSIS — F419 Anxiety disorder, unspecified: Secondary | ICD-10-CM

## 2022-12-30 ENCOUNTER — Other Ambulatory Visit: Payer: Self-pay | Admitting: *Deleted

## 2022-12-30 DIAGNOSIS — F419 Anxiety disorder, unspecified: Secondary | ICD-10-CM

## 2022-12-30 MED ORDER — DIAZEPAM 5 MG PO TABS: 5.0000 mg | ORAL_TABLET | Freq: Two times a day (BID) | ORAL | 0 refills | Status: DC | PRN

## 2022-12-30 NOTE — Telephone Encounter (Signed)
Patient called and stated that she needs a refill on her Diazepam. Stated that she did not get a month's worth last time. She takes it twice daily. Gets medication through [facility].   Epic LR: 12/15/2022 #30 No Contract on File. Note added to upcoming appointment.   Pended Rx and sent to [facility] for approval.

## 2023-01-05 ENCOUNTER — Other Ambulatory Visit: Payer: Self-pay | Admitting: Adult Health

## 2023-01-25 ENCOUNTER — Other Ambulatory Visit: Payer: Self-pay | Admitting: Adult Health

## 2023-01-25 DIAGNOSIS — F419 Anxiety disorder, unspecified: Secondary | ICD-10-CM

## 2023-02-07 ENCOUNTER — Other Ambulatory Visit: Payer: Self-pay

## 2023-02-07 ENCOUNTER — Other Ambulatory Visit (HOSPITAL_COMMUNITY): Payer: Self-pay

## 2023-02-07 ENCOUNTER — Other Ambulatory Visit: Payer: Self-pay | Admitting: Interventional Cardiology

## 2023-02-07 MED ORDER — NICOTINE POLACRILEX 4 MG MT GUM
4.0000 mg | CHEWING_GUM | OROMUCOSAL | 0 refills | Status: DC | PRN
  Filled 2023-02-07 – 2023-04-07 (×2): qty 100, 30d supply, fill #0

## 2023-02-08 ENCOUNTER — Other Ambulatory Visit (HOSPITAL_COMMUNITY): Payer: Self-pay

## 2023-02-09 ENCOUNTER — Other Ambulatory Visit (HOSPITAL_COMMUNITY): Payer: Self-pay

## 2023-02-21 DIAGNOSIS — R0602 Shortness of breath: Secondary | ICD-10-CM | POA: Diagnosis not present

## 2023-02-21 DIAGNOSIS — E7800 Pure hypercholesterolemia, unspecified: Secondary | ICD-10-CM | POA: Diagnosis not present

## 2023-02-21 DIAGNOSIS — Z131 Encounter for screening for diabetes mellitus: Secondary | ICD-10-CM | POA: Diagnosis not present

## 2023-02-21 DIAGNOSIS — R5383 Other fatigue: Secondary | ICD-10-CM | POA: Diagnosis not present

## 2023-02-21 DIAGNOSIS — Z1159 Encounter for screening for other viral diseases: Secondary | ICD-10-CM | POA: Diagnosis not present

## 2023-02-21 DIAGNOSIS — Z79899 Other long term (current) drug therapy: Secondary | ICD-10-CM | POA: Diagnosis not present

## 2023-02-21 DIAGNOSIS — E559 Vitamin D deficiency, unspecified: Secondary | ICD-10-CM | POA: Diagnosis not present

## 2023-02-21 DIAGNOSIS — M129 Arthropathy, unspecified: Secondary | ICD-10-CM | POA: Diagnosis not present

## 2023-02-21 DIAGNOSIS — Z87891 Personal history of nicotine dependence: Secondary | ICD-10-CM | POA: Diagnosis not present

## 2023-02-21 DIAGNOSIS — Z0000 Encounter for general adult medical examination without abnormal findings: Secondary | ICD-10-CM | POA: Diagnosis not present

## 2023-02-23 DIAGNOSIS — Z79899 Other long term (current) drug therapy: Secondary | ICD-10-CM | POA: Diagnosis not present

## 2023-02-27 DIAGNOSIS — Z87891 Personal history of nicotine dependence: Secondary | ICD-10-CM | POA: Diagnosis not present

## 2023-02-27 DIAGNOSIS — R0602 Shortness of breath: Secondary | ICD-10-CM | POA: Diagnosis not present

## 2023-02-27 DIAGNOSIS — Z79899 Other long term (current) drug therapy: Secondary | ICD-10-CM | POA: Diagnosis not present

## 2023-02-27 DIAGNOSIS — F32A Depression, unspecified: Secondary | ICD-10-CM | POA: Diagnosis not present

## 2023-02-27 DIAGNOSIS — I071 Rheumatic tricuspid insufficiency: Secondary | ICD-10-CM | POA: Diagnosis not present

## 2023-02-27 DIAGNOSIS — R0989 Other specified symptoms and signs involving the circulatory and respiratory systems: Secondary | ICD-10-CM | POA: Diagnosis not present

## 2023-02-27 DIAGNOSIS — E7800 Pure hypercholesterolemia, unspecified: Secondary | ICD-10-CM | POA: Diagnosis not present

## 2023-02-27 DIAGNOSIS — F17210 Nicotine dependence, cigarettes, uncomplicated: Secondary | ICD-10-CM | POA: Diagnosis not present

## 2023-02-27 DIAGNOSIS — R7303 Prediabetes: Secondary | ICD-10-CM | POA: Diagnosis not present

## 2023-02-27 DIAGNOSIS — E785 Hyperlipidemia, unspecified: Secondary | ICD-10-CM | POA: Diagnosis not present

## 2023-02-27 DIAGNOSIS — N182 Chronic kidney disease, stage 2 (mild): Secondary | ICD-10-CM | POA: Diagnosis not present

## 2023-03-01 DIAGNOSIS — Z79899 Other long term (current) drug therapy: Secondary | ICD-10-CM | POA: Diagnosis not present

## 2023-03-01 DIAGNOSIS — R0989 Other specified symptoms and signs involving the circulatory and respiratory systems: Secondary | ICD-10-CM | POA: Diagnosis not present

## 2023-03-01 DIAGNOSIS — F17210 Nicotine dependence, cigarettes, uncomplicated: Secondary | ICD-10-CM | POA: Diagnosis not present

## 2023-03-01 DIAGNOSIS — I071 Rheumatic tricuspid insufficiency: Secondary | ICD-10-CM | POA: Diagnosis not present

## 2023-03-10 ENCOUNTER — Ambulatory Visit: Payer: 59 | Admitting: Internal Medicine

## 2023-03-11 DIAGNOSIS — I071 Rheumatic tricuspid insufficiency: Secondary | ICD-10-CM | POA: Diagnosis not present

## 2023-03-11 DIAGNOSIS — R0989 Other specified symptoms and signs involving the circulatory and respiratory systems: Secondary | ICD-10-CM | POA: Diagnosis not present

## 2023-03-14 ENCOUNTER — Other Ambulatory Visit: Payer: Self-pay | Admitting: Adult Health

## 2023-03-14 DIAGNOSIS — F320 Major depressive disorder, single episode, mild: Secondary | ICD-10-CM

## 2023-03-16 DIAGNOSIS — I509 Heart failure, unspecified: Secondary | ICD-10-CM | POA: Diagnosis not present

## 2023-03-16 DIAGNOSIS — F17210 Nicotine dependence, cigarettes, uncomplicated: Secondary | ICD-10-CM | POA: Diagnosis not present

## 2023-03-16 DIAGNOSIS — R0602 Shortness of breath: Secondary | ICD-10-CM | POA: Diagnosis not present

## 2023-03-16 DIAGNOSIS — Z87891 Personal history of nicotine dependence: Secondary | ICD-10-CM | POA: Diagnosis not present

## 2023-03-17 DIAGNOSIS — R0602 Shortness of breath: Secondary | ICD-10-CM | POA: Diagnosis not present

## 2023-03-24 DIAGNOSIS — Z87891 Personal history of nicotine dependence: Secondary | ICD-10-CM | POA: Diagnosis not present

## 2023-03-27 DIAGNOSIS — N182 Chronic kidney disease, stage 2 (mild): Secondary | ICD-10-CM | POA: Diagnosis not present

## 2023-03-27 DIAGNOSIS — R0602 Shortness of breath: Secondary | ICD-10-CM | POA: Diagnosis not present

## 2023-03-27 DIAGNOSIS — F32A Depression, unspecified: Secondary | ICD-10-CM | POA: Diagnosis not present

## 2023-03-27 DIAGNOSIS — E785 Hyperlipidemia, unspecified: Secondary | ICD-10-CM | POA: Diagnosis not present

## 2023-03-27 DIAGNOSIS — L309 Dermatitis, unspecified: Secondary | ICD-10-CM | POA: Diagnosis not present

## 2023-03-27 DIAGNOSIS — Z79899 Other long term (current) drug therapy: Secondary | ICD-10-CM | POA: Diagnosis not present

## 2023-03-27 DIAGNOSIS — R7303 Prediabetes: Secondary | ICD-10-CM | POA: Diagnosis not present

## 2023-03-27 DIAGNOSIS — F17210 Nicotine dependence, cigarettes, uncomplicated: Secondary | ICD-10-CM | POA: Diagnosis not present

## 2023-03-28 ENCOUNTER — Other Ambulatory Visit: Payer: Self-pay | Admitting: Adult Health

## 2023-03-28 DIAGNOSIS — F419 Anxiety disorder, unspecified: Secondary | ICD-10-CM

## 2023-03-28 DIAGNOSIS — R0602 Shortness of breath: Secondary | ICD-10-CM | POA: Diagnosis not present

## 2023-03-28 DIAGNOSIS — F17210 Nicotine dependence, cigarettes, uncomplicated: Secondary | ICD-10-CM | POA: Diagnosis not present

## 2023-03-30 DIAGNOSIS — Z79899 Other long term (current) drug therapy: Secondary | ICD-10-CM | POA: Diagnosis not present

## 2023-04-07 ENCOUNTER — Encounter: Payer: Self-pay | Admitting: Adult Health

## 2023-04-08 ENCOUNTER — Other Ambulatory Visit (HOSPITAL_COMMUNITY): Payer: Self-pay

## 2023-04-08 ENCOUNTER — Other Ambulatory Visit: Payer: Self-pay | Admitting: Obstetrics & Gynecology

## 2023-04-08 ENCOUNTER — Other Ambulatory Visit: Payer: Self-pay

## 2023-04-08 DIAGNOSIS — Z1231 Encounter for screening mammogram for malignant neoplasm of breast: Secondary | ICD-10-CM

## 2023-04-08 NOTE — Telephone Encounter (Signed)
Will need to schedule a face to face appointment to re-establish care. Thanks.

## 2023-04-11 ENCOUNTER — Encounter: Payer: Self-pay | Admitting: Adult Health

## 2023-04-11 ENCOUNTER — Ambulatory Visit (INDEPENDENT_AMBULATORY_CARE_PROVIDER_SITE_OTHER): Payer: 59 | Admitting: Adult Health

## 2023-04-11 VITALS — BP 136/78 | HR 86 | Temp 97.2°F | Resp 18 | Ht 65.0 in | Wt 194.8 lb

## 2023-04-11 DIAGNOSIS — E785 Hyperlipidemia, unspecified: Secondary | ICD-10-CM

## 2023-04-11 DIAGNOSIS — R7303 Prediabetes: Secondary | ICD-10-CM | POA: Diagnosis not present

## 2023-04-11 DIAGNOSIS — K219 Gastro-esophageal reflux disease without esophagitis: Secondary | ICD-10-CM

## 2023-04-11 DIAGNOSIS — Z720 Tobacco use: Secondary | ICD-10-CM

## 2023-04-11 DIAGNOSIS — Z7689 Persons encountering health services in other specified circumstances: Secondary | ICD-10-CM

## 2023-04-11 DIAGNOSIS — F419 Anxiety disorder, unspecified: Secondary | ICD-10-CM

## 2023-04-11 DIAGNOSIS — J449 Chronic obstructive pulmonary disease, unspecified: Secondary | ICD-10-CM

## 2023-04-11 DIAGNOSIS — F32A Depression, unspecified: Secondary | ICD-10-CM | POA: Diagnosis not present

## 2023-04-11 DIAGNOSIS — M8589 Other specified disorders of bone density and structure, multiple sites: Secondary | ICD-10-CM | POA: Diagnosis not present

## 2023-04-11 MED ORDER — DIAZEPAM 5 MG PO TABS: 5.0000 mg | ORAL_TABLET | Freq: Two times a day (BID) | ORAL | 0 refills | Status: DC | PRN

## 2023-04-11 MED ORDER — NICOTINE POLACRILEX 4 MG MT GUM: 4.0000 mg | CHEWING_GUM | OROMUCOSAL | 0 refills | Status: DC | PRN

## 2023-04-11 NOTE — Progress Notes (Signed)
[facility] clinic  Provider:  Kenard Gower DNP  Code Status:  Full Code  Goals of Care:     04/11/2023    8:22 AM  Advanced Directives  Does Patient Have a Medical Advance Directive? No  Would patient like information on creating a medical advance directive? No - Patient declined     Chief Complaint  Patient presents with   Establish Care    Re-establish care    HPI: Patient is a 52 y.o. female seen today to re-establish care with [facility].  Chronic obstructive pulmonary disease, unspecified COPD type (HCC) -  denies SOB, takes Spiriva  Gastroesophageal reflux disease without esophagitis takes Famotidine  Anxiety and depression -  takes Wellbutrin, Sertraline and Valium PRN  Hyperlipidemia, unspecified hyperlipidemia type  -  takes Rosuvastatin  Tobacco abuse - plans to stop smoking, requesting Nicotine gum  Prediabetes -  A1C 5.7, not on medication     Wt Readings from Last 3 Encounters:  04/11/23 194 lb 12.8 oz (88.4 kg)  12/17/22 188 lb 12.8 oz (85.6 kg)  12/06/22 194 lb (88 kg)     Past Medical History:  Diagnosis Date   Anxiety    ARF (acute renal failure) (HCC)    a. 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   Arthritis    "knees" (05/09/2013)   Asthma    Cardiac arrest (HCC)    a. 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   Cardiac tamponade    a. s/p subxiphoid window 2007 in setting of TV endocarditis. b. She had suffered a mild anoxic injury during her recent hospitalization felt to be related to hypoperfusion /notes 06/15/2006 (05/09/2013)   Chronic headaches    Depression    Endocarditis    a. TV endocarditis 2007 - prolonged hospitalization requiring surgical debridement of the TV with asystolic cardiac arrest, pericardial tamponade s/p subxiphoid window and PFO closure surgically at that time, along with septic pulmonary emboli, respiratory failure, and renal failure requiring short  duration of dialysis at that time.   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 s/p closure in 2007.   Septic pulmonary embolism (HCC)    a. During admission for TV endocarditis.   Severe tricuspid regurgitation    a. By TEE/cath 04/2013 - for outpt referral to [facility].    Tobacco abuse     Past Surgical History:  Procedure Laterality Date   CARDIAC CATHETERIZATION     multiple/notes 06/15/2006 (05/09/2013)   DILATION AND CURETTAGE OF UTERUS     "couple of those years ago; one a couple years ago after I lost a pregnancy" (05/09/2013)   LEFT AND RIGHT HEART CATHETERIZATION WITH CORONARY ANGIOGRAM N/A 05/08/2013   Procedure: LEFT AND RIGHT HEART CATHETERIZATION WITH CORONARY ANGIOGRAM;  Surgeon: Kathleene Hazel, MD;  Location: [facility] CATH LAB;  Service: Cardiovascular;  Laterality: N/A;   PATENT FORAMEN OVALE CLOSURE  04/22/2006   closed during tricuspid valve surgery   SUBXYPHOID PERICARDIAL WINDOW  05/07/2006   for large postoperative pericardial effusion    TEE WITHOUT CARDIOVERSION N/A 05/08/2013  Procedure: TRANSESOPHAGEAL ECHOCARDIOGRAM (TEE);  Surgeon: Lewayne Bunting, MD;  Location: [facility] ENDOSCOPY;  Service: Cardiovascular;  Laterality: N/A;   TRICUSPID VALVE SURGERY  04/22/2006   debridement of septal leaflet for MSSA bacterial endocarditis with closure PFO    Allergies  Allergen Reactions   Ivp Dye [Iodinated Contrast Media] Shortness Of Breath   Amoxicillin Diarrhea and Other (See Comments)    " I get very sick with this medication"   Latex Rash    " I'm allergic to latex, and it breaks by skin out in an itchy rash"   Penicillins Rash    Outpatient Encounter Medications as of 04/11/2023  Medication Sig    famotidine (PEPCID) 20 MG tablet TAKE 1 TABLET BY MOUTH DAILY AS  NEEDED FOR HEARTBURN   ipratropium (ATROVENT HFA) 17 MCG/ACT inhaler Inhale 2 puffs into the lungs every 6 (six) hours as needed for wheezing.   Omega-3 Fatty Acids (FISH OIL) 1000 MG CAPS Take 3 capsules by mouth daily.   rosuvastatin (CRESTOR) 20 MG tablet TAKE 1 TABLET BY MOUTH DAILY   saccharomyces boulardii (FLORASTOR) 250 MG capsule Take 1 capsule (250 mg total) by mouth 2 (two) times daily.   sertraline (ZOLOFT) 100 MG tablet TAKE 1 AND 1/2 TABLETS BY MOUTH  DAILY   SPIRIVA HANDIHALER 18 MCG inhalation capsule INHALE THE CONTENTS OF 1 CAPSULE BY MOUTH VIA INHALATION DEVICE  ONCE DAILY AS DIRECTED   [DISCONTINUED] diazepam (VALIUM) 5 MG tablet TAKE 1 TABLET BY MOUTH EVERY 12  HOURS AS NEEDED FOR ANXIETY   [DISCONTINUED] nicotine polacrilex (NICORETTE) 4 MG gum Take 1 each (4 mg total) by mouth as needed for smoking cessation.   diazepam (VALIUM) 5 MG tablet Take 1 tablet (5 mg total) by mouth every 12 (twelve) hours as needed. for anxiety   nicotine polacrilex (NICORETTE) 4 MG gum Take 1 each (4 mg total) by mouth as needed for smoking cessation.   No facility-administered encounter medications on file as of 04/11/2023.    Review of Systems:  Review of Systems  Constitutional:  Negative for appetite change, chills, fatigue and fever.  HENT:  Negative for congestion, hearing loss, rhinorrhea and sore throat.   Eyes: Negative.   Respiratory:  Negative for cough, shortness of breath and wheezing.   Cardiovascular:  Negative for chest pain, palpitations and leg swelling.  Gastrointestinal:  Negative for abdominal pain, constipation, diarrhea, nausea and vomiting.  Genitourinary:  Negative for dysuria.  Musculoskeletal:  Negative for arthralgias, back pain and myalgias.  Skin:  Negative for color change, rash and wound.  Neurological:  Negative for dizziness, weakness and headaches.  Psychiatric/Behavioral:  Negative for  behavioral problems. The patient is not nervous/anxious.     Health Maintenance  Topic Date Due   COVID-19 Vaccine (3 - Moderna risk series) 03/19/2020   Zoster Vaccines- Shingrix (2 of 2) 12/17/2021   MAMMOGRAM  02/05/2023   INFLUENZA VACCINE  06/02/2023   Medicare Annual Wellness (AWV)  06/16/2023   Lung Cancer Screening  09/28/2023   DTaP/Tdap/Td (2 - Td or Tdap) 01/17/2024   PAP SMEAR-Modifier  01/31/2024   Colonoscopy  07/07/2031   Hepatitis C Screening  Completed   HIV Screening  Completed   HPV VACCINES  Aged Out    Physical Exam: Vitals:   04/11/23 1008  BP: 136/78  Pulse: 86  Resp: 18  Temp: (!) 97.2 F (36.2 C)  SpO2: 92%  Weight: 194 lb 12.8 oz (88.4 kg)  Height: 5\' 5"  (1.651  m)   Body mass index is 32.42 kg/m. Physical Exam Constitutional:      Appearance: She is obese.  HENT:     Head: Normocephalic and atraumatic.     Nose: Nose normal.     Mouth/Throat:     Mouth: Mucous membranes are moist.  Eyes:     Conjunctiva/sclera: Conjunctivae normal.  Cardiovascular:     Rate and Rhythm: Normal rate and regular rhythm.  Pulmonary:     Effort: Pulmonary effort is normal.     Breath sounds: Wheezing and rhonchi present.  Abdominal:     General: Bowel sounds are normal.     Palpations: Abdomen is soft.  Musculoskeletal:        General: Normal range of motion.     Cervical back: Normal range of motion.  Skin:    General: Skin is warm and dry.  Neurological:     General: No focal deficit present.     Mental Status: She is alert and oriented to person, place, and time.  Psychiatric:        Mood and Affect: Mood normal.        Behavior: Behavior normal.        Thought Content: Thought content normal.        Judgment: Judgment normal.     Labs reviewed: Basic Metabolic Panel: Recent Labs    08/23/22 1218  NA 142  K 4.6  CL 108  CO2 26  GLUCOSE 77  BUN 25  CREATININE 0.74  CALCIUM 9.3   Liver Function Tests: Recent Labs     08/23/22 1218  AST 12  ALT 12  BILITOT 0.6  PROT 6.4   No results for input(s): "LIPASE", "AMYLASE" in the last 8760 hours. No results for input(s): "AMMONIA" in the last 8760 hours. CBC: No results for input(s): "WBC", "NEUTROABS", "HGB", "HCT", "MCV", "PLT" in the last 8760 hours. Lipid Panel: No results for input(s): "CHOL", "HDL", "LDLCALC", "TRIG", "CHOLHDL", "LDLDIRECT" in the last 8760 hours. Lab Results  Component Value Date   HGBA1C 5.7 01/15/2022    Procedures since last visit: No results found.  Assessment/Plan  1. Encounter to establish care -  re-establish care  2. Chronic obstructive pulmonary disease, unspecified COPD type (HCC) -  has wheezing and rhonchi -  continue Spiriva  3. Gastroesophageal reflux disease without esophagitis -  stable -  continue Famotidine  4. Anxiety and depression -  continue Wellbutrin, Sertraline and Valium PRN  5. Hyperlipidemia, unspecified hyperlipidemia type Lab Results  Component Value Date   CHOL 115 01/15/2022   HDL 44.00 01/15/2022   LDLCALC 53 01/15/2022   TRIG 91.0 01/15/2022   CHOLHDL 3 01/15/2022    -  continue Rosuvastatin  6. Tobacco abuse - nicotine polacrilex (NICORETTE) 4 MG gum; Take 1 each (4 mg total) by mouth as needed for smoking cessation.  Dispense: 270 tablet; Refill: 0  7. Prediabetes Lab Results  Component Value Date   HGBA1C 5.7 01/15/2022    -  diet-controlled  8. Osteopenia of multiple sites -  continue Calcium and Vitamin D3  9. Anxiety -  mood is stable - diazepam (VALIUM) 5 MG tablet; Take 1 tablet (5 mg total) by mouth every 12 (twelve) hours as needed. for anxiety  Dispense: 60 tablet; Refill: 0    Labs/tests ordered:   None   Next appt:  Visit date not found

## 2023-04-15 ENCOUNTER — Ambulatory Visit: Payer: 59

## 2023-04-17 NOTE — Progress Notes (Incomplete)
[facility] clinic  Provider:  Kenard Gower DNP  Code Status:  Full Code  Goals of Care:     04/11/2023    8:22 AM  Advanced Directives  Does Patient Have a Medical Advance Directive? No  Would patient like information on creating a medical advance directive? No - Patient declined     Chief Complaint  Patient presents with  . Establish Care    Re-establish care    HPI: Patient is a 52 y.o. female seen today to re-establish care with [facility].  Chronic obstructive pulmonary disease, unspecified COPD type (HCC) -  denies  Gastroesophageal reflux disease without esophagitis  Anxiety and depression  Hyperlipidemia, unspecified hyperlipidemia type  Tobacco abuse - Plan: nicotine polacrilex (NICORETTE) 4 MG gum  Prediabetes  Osteopenia of multiple sites  Anxiety - Plan: diazepam (VALIUM) 5 MG tablet     Wt Readings from Last 3 Encounters:  04/11/23 194 lb 12.8 oz (88.4 kg)  12/17/22 188 lb 12.8 oz (85.6 kg)  12/06/22 194 lb (88 kg)     Past Medical History:  Diagnosis Date  . Anxiety   . ARF (acute renal failure) (HCC)    a. 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  . Arthritis    "knees" (05/09/2013)  . Asthma   . Cardiac arrest (HCC)    a. 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  . Cardiac tamponade    a. s/p subxiphoid window 2007 in setting of TV endocarditis. b. She had suffered a mild anoxic injury during her recent hospitalization felt to be related to hypoperfusion [name] 06/15/2006 (05/09/2013)  . Chronic headaches   . Depression   . Endocarditis    a. 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  Marland Kitchen GERD  (gastroesophageal reflux disease)   . 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 Hepatitis C   . History of bronchitis   . History of drug abuse (HCC)   . History of tricuspid valve disorder 04/16/2013  . Hx: UTI (urinary tract infection)   . Memory loss, short term    "since OHS in 2007" (05/09/2013)  . PFO (patent foramen ovale)    a. s/p closure in 2007.  Marland Kitchen Septic pulmonary embolism (HCC)    a. During admission for TV endocarditis.  . Severe tricuspid regurgitation    a. By TEE/cath 04/2013 - for outpt referral to [facility].   . Tobacco abuse     Past Surgical History:  Procedure Laterality Date  . CARDIAC CATHETERIZATION     multiple/notes 06/15/2006 (05/09/2013)  . DILATION AND CURETTAGE OF UTERUS     "couple of those years ago; one a couple years ago after I lost a pregnancy" (05/09/2013)  . LEFT AND RIGHT HEART CATHETERIZATION WITH CORONARY ANGIOGRAM N/A 05/08/2013   Procedure: LEFT AND RIGHT HEART CATHETERIZATION WITH CORONARY ANGIOGRAM;  Surgeon: Kathleene Hazel, MD;  Location: [facility] CATH LAB;  Service: Cardiovascular;  Laterality: N/A;  . PATENT FORAMEN OVALE CLOSURE  04/22/2006   closed during tricuspid valve surgery  . SUBXYPHOID PERICARDIAL WINDOW  05/07/2006   for large postoperative pericardial effusion   . TEE WITHOUT CARDIOVERSION N/A 05/08/2013   Procedure: TRANSESOPHAGEAL ECHOCARDIOGRAM (TEE);  Surgeon: Lewayne Bunting, MD;  Location: [facility] ENDOSCOPY;  Service: Cardiovascular;  Laterality: N/A;  . TRICUSPID VALVE SURGERY  04/22/2006   debridement of septal leaflet for MSSA bacterial endocarditis with closure PFO    Allergies  Allergen Reactions  . Ivp Dye [Iodinated Contrast Media] Shortness Of Breath  . Amoxicillin Diarrhea and Other (See Comments)    " I get very sick with this medication"  . Latex Rash    " I'm allergic to latex, and it breaks by skin out in an itchy rash"  . Penicillins Rash    Outpatient Encounter Medications as of 04/11/2023  Medication Sig  . famotidine  (PEPCID) 20 MG tablet TAKE 1 TABLET BY MOUTH DAILY AS  NEEDED FOR HEARTBURN  . ipratropium (ATROVENT HFA) 17 MCG/ACT inhaler Inhale 2 puffs into the lungs every 6 (six) hours as needed for wheezing.  . Omega-3 Fatty Acids (FISH OIL) 1000 MG CAPS Take 3 capsules by mouth daily.  . rosuvastatin (CRESTOR) 20 MG tablet TAKE 1 TABLET BY MOUTH DAILY  . saccharomyces boulardii (FLORASTOR) 250 MG capsule Take 1 capsule (250 mg total) by mouth 2 (two) times daily.  . sertraline (ZOLOFT) 100 MG tablet TAKE 1 AND 1/2 TABLETS BY MOUTH  DAILY  . SPIRIVA HANDIHALER 18 MCG inhalation capsule INHALE THE CONTENTS OF 1 CAPSULE BY MOUTH VIA INHALATION DEVICE  ONCE DAILY AS DIRECTED  . [DISCONTINUED] diazepam (VALIUM) 5 MG tablet TAKE 1 TABLET BY MOUTH EVERY 12  HOURS AS NEEDED FOR ANXIETY  . [DISCONTINUED] nicotine polacrilex (NICORETTE) 4 MG gum Take 1 each (4 mg total) by mouth as needed for smoking cessation.  . diazepam (VALIUM) 5 MG tablet Take 1 tablet (5 mg total) by mouth every 12 (twelve) hours as needed. for anxiety  . nicotine polacrilex (NICORETTE) 4 MG gum Take 1 each (4 mg total) by mouth as needed for smoking cessation.   No facility-administered encounter medications on file as of 04/11/2023.    Review of Systems:  Review of Systems  Constitutional:  Negative for appetite change, chills, fatigue and fever.  HENT:  Negative for congestion, hearing loss, rhinorrhea and sore throat.   Eyes: Negative.   Respiratory:  Negative for cough, shortness of breath and wheezing.   Cardiovascular:  Negative for chest pain, palpitations and leg swelling.  Gastrointestinal:  Negative for abdominal pain, constipation, diarrhea, nausea and vomiting.  Genitourinary:  Negative for dysuria.  Musculoskeletal:  Negative for arthralgias, back pain and myalgias.  Skin:  Negative for color change, rash and wound.  Neurological:  Negative for dizziness, weakness and headaches.  Psychiatric/Behavioral:  Negative for  behavioral problems. The patient is not nervous/anxious.     Health Maintenance  Topic Date Due  . COVID-19 Vaccine (3 - Moderna risk series) 03/19/2020  . Zoster Vaccines- Shingrix (2 of 2) 12/17/2021  . MAMMOGRAM  02/05/2023  . INFLUENZA VACCINE  06/02/2023  . Medicare Annual Wellness (AWV)  06/16/2023  . Lung Cancer Screening  09/28/2023  . DTaP/Tdap/Td (2 - Td or Tdap) 01/17/2024  . PAP SMEAR-Modifier  01/31/2024  . Colonoscopy  07/07/2031  . Hepatitis C Screening  Completed  . HIV Screening  Completed  . HPV VACCINES  Aged Out    Physical Exam: Vitals:   04/11/23 1008  BP: 136/78  Pulse: 86  Resp: 18  Temp: (!) 97.2 F (36.2 C)  SpO2: 92%  Weight: 194 lb 12.8 oz (88.4 kg)  Height: 5\' 5"  (1.651 m)   Body mass index is 32.42 kg/m. Physical Exam  Constitutional:      Appearance: Normal appearance.  HENT:     Head: Normocephalic and atraumatic.     Nose: Nose normal.     Mouth/Throat:     Mouth: Mucous membranes are moist.  Eyes:     Conjunctiva/sclera: Conjunctivae normal.  Cardiovascular:     Rate and Rhythm: Normal rate and regular rhythm.  Pulmonary:     Effort: Pulmonary effort is normal.     Breath sounds: Wheezing and rhonchi present.  Abdominal:     General: Bowel sounds are normal.     Palpations: Abdomen is soft.  Musculoskeletal:        General: Normal range of motion.     Cervical back: Normal range of motion.  Skin:    General: Skin is warm and dry.  Neurological:     General: No focal deficit present.     Mental Status: She is alert and oriented to person, place, and time.  Psychiatric:        Mood and Affect: Mood normal.        Behavior: Behavior normal.        Thought Content: Thought content normal.        Judgment: Judgment normal.     Labs reviewed: Basic Metabolic Panel: Recent Labs    08/23/22 1218  NA 142  K 4.6  CL 108  CO2 26  GLUCOSE 77  BUN 25  CREATININE 0.74  CALCIUM 9.3   Liver Function Tests: Recent Labs     08/23/22 1218  AST 12  ALT 12  BILITOT 0.6  PROT 6.4   No results for input(s): "LIPASE", "AMYLASE" in the last 8760 hours. No results for input(s): "AMMONIA" in the last 8760 hours. CBC: No results for input(s): "WBC", "NEUTROABS", "HGB", "HCT", "MCV", "PLT" in the last 8760 hours. Lipid Panel: No results for input(s): "CHOL", "HDL", "LDLCALC", "TRIG", "CHOLHDL", "LDLDIRECT" in the last 8760 hours. Lab Results  Component Value Date   HGBA1C 5.7 01/15/2022    Procedures since last visit: No results found.  Assessment/Plan   Labs/tests ordered:     Next appt:  Visit date not found

## 2023-04-19 MED ORDER — CALCIUM CARBONATE 600 MG PO TABS: 600.0000 mg | ORAL_TABLET | Freq: Two times a day (BID) | ORAL | 11 refills | Status: AC

## 2023-04-22 ENCOUNTER — Other Ambulatory Visit: Payer: Self-pay

## 2023-04-22 MED ORDER — BUPROPION HCL ER (XL) 150 MG PO TB24: 150.0000 mg | ORAL_TABLET | Freq: Every day | ORAL | 1 refills | Status: DC

## 2023-04-22 NOTE — Telephone Encounter (Signed)
Pharmacy send rx for Wellbutrin 150 mg and medication never filled by Arrie Eastern- Vargas,NP

## 2023-05-12 ENCOUNTER — Other Ambulatory Visit: Payer: Self-pay | Admitting: Adult Health

## 2023-05-18 ENCOUNTER — Ambulatory Visit
Admission: RE | Admit: 2023-05-18 | Discharge: 2023-05-18 | Disposition: A | Discharge status: Home / Self Care | Payer: 59 | Source: Ambulatory Visit | Attending: Obstetrics & Gynecology | Admitting: Obstetrics & Gynecology

## 2023-05-18 DIAGNOSIS — Z1231 Encounter for screening mammogram for malignant neoplasm of breast: Secondary | ICD-10-CM

## 2023-05-27 ENCOUNTER — Other Ambulatory Visit: Payer: Self-pay

## 2023-05-27 DIAGNOSIS — F419 Anxiety disorder, unspecified: Secondary | ICD-10-CM

## 2023-05-27 MED ORDER — DIAZEPAM 5 MG PO TABS: 5.0000 mg | ORAL_TABLET | Freq: Two times a day (BID) | ORAL | 0 refills | Status: DC | PRN

## 2023-05-27 NOTE — Telephone Encounter (Signed)
Patient is requesting a refill of the following medications: Requested Prescriptions   Pending Prescriptions Disp Refills   diazepam (VALIUM) 5 MG tablet 60 tablet 0    Sig: Take 1 tablet (5 mg total) by mouth every 12 (twelve) hours as needed. for anxiety    Date of last refill:04/11/2023  Refill amount: 60 tablets 0 refill   Treatment agreement date:     Patient states she will need a refill and you can give her a call if u need to discuss anything

## 2023-06-05 NOTE — Progress Notes (Unsigned)
Office Visit    Patient Name: Tina Fitzgerald Date of Encounter: 06/05/2023  Primary Care Provider:  Gillis Santa, NP Primary Cardiologist:  Dietrich Pates, MD Primary Electrophysiologist: None   Past Medical History    Past Medical History:  Diagnosis Date   Anxiety    ARF (acute renal failure) (HCC)    a. 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   Arthritis    "knees" (05/09/2013)   Asthma    Cardiac arrest (HCC)    a. 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   Cardiac tamponade    a. s/p subxiphoid window 2007 in setting of TV endocarditis. b. 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 s/p closure in 2007.   Septic pulmonary embolism (HCC)    a. During admission for TV endocarditis.   Severe tricuspid regurgitation    a. By TEE/cath 04/2013 - for outpt referral to [facility].    Tobacco abuse    Past Surgical History:  Procedure Laterality Date   CARDIAC CATHETERIZATION     multiple/notes 06/15/2006 (05/09/2013)   DILATION AND CURETTAGE OF UTERUS     "couple of those years ago; one a couple years ago after I lost  a pregnancy" (05/09/2013)   LEFT AND RIGHT HEART CATHETERIZATION WITH CORONARY ANGIOGRAM N/A 05/08/2013   Procedure: LEFT AND RIGHT HEART CATHETERIZATION WITH CORONARY ANGIOGRAM;  Surgeon: Kathleene Hazel, MD;  Location[facility] CATH LAB;  Service: Cardiovascular;  Laterality: N/A;   PATENT FORAMEN OVALE CLOSURE  04/22/2006   closed during tricuspid valve surgery   SUBXYPHOID PERICARDIAL WINDOW  05/07/2006   for large postoperative pericardial effusion    TEE WITHOUT CARDIOVERSION N/A 05/08/2013   Procedure: TRANSESOPHAGEAL ECHOCARDIOGRAM (TEE);  Surgeon: Lewayne Bunting, MD;  Location: [facility] ENDOSCOPY;  Service: Cardiovascular;  Laterality: N/A;   TRICUSPID VALVE SURGERY  04/22/2006   debridement of septal leaflet for MSSA bacterial endocarditis with closure PFO    Allergies  Allergies  Allergen Reactions   Ivp Dye [Iodinated Contrast Media] Shortness Of Breath   Amoxicillin Diarrhea and Other (See Comments)    " I get very sick with this medication"   Latex Rash    " I'm allergic to latex, and it breaks by skin out in an itchy rash"   Penicillins Rash     History of Present Illness    Tina Fitzgerald  is a 52 year old female with a PMH  tricuspid regurgitation, hepatitis C, hep B, pulmonary embolus, endocarditis-(MSSA 2007 due to polysubstance abuse, complicated by cardiac tamponade treated with surgical debridement of  the tricuspid valve with drainage and PFO closure and drainage of pericardial effusion), COPD who presents today for 31-month follow-up.  Tina Fitzgerald was last seen by Dr. Eldridge Dace on 12/17/2022 for annual follow-up.  During visit patient had report of chest pain for several days that she described as stabbing pain which cause shortness of breath.  She [place] significant family stress and noted increase in pain when lifting her grandson.  EKG was completed and reassuring with no ischemic changes.  Ischemic testing was deferred at that time with plan to perform if pain became  persistent.  She was euvolemic on exam reported still smoking and was advised to start Nicorette gum with assistance of Pharm.D.  She was seen recently by her PCP and given new prescription for 4 mg Nicorette gum.  Since last being seen in the office patient reports that she has been doing well with no current cardiac complaints.  She reports that her chest pain has resolved since moving out of her mother's house and she is now currently living with her daughter in [place].  She continues to smoke 2 to 3 cigarettes/day and is using nicotine lozenges borrowed from her mother and Nicorette gum.  Her blood pressure today is controlled at 138/82 and heart rate was 90 bpm.  She is currently not active and was encouraged to increase physical activity to at least 150 minutes/week.  She has noted significant weight gain since cutting back on her cigarette consumption.  Patient denies chest pain, palpitations, dyspnea, PND, orthopnea, nausea, vomiting, dizziness, syncope, edema, weight gain, or early satiety.   Home Medications    Current Outpatient Medications  Medication Sig Dispense Refill   buPROPion (WELLBUTRIN XL) 150 MG 24 hr tablet TAKE 1 TABLET BY MOUTH DAILY 90 tablet 1   calcium carbonate (CALCIUM 600) 600 MG TABS tablet Take 1 tablet (600 mg total) by mouth 2 (two) times daily with a meal. 60 tablet 11   Cholecalciferol (VITAMIN D3) 1000 units CAPS Take 25 mg by mouth daily.     diazepam (VALIUM) 5 MG tablet Take 1 tablet (5 mg total) by mouth every 12 (twelve) hours as needed. for anxiety 60 tablet 0   famotidine (PEPCID) 20 MG tablet TAKE 1 TABLET BY MOUTH DAILY AS  NEEDED FOR HEARTBURN 90 tablet 3   ipratropium (ATROVENT HFA) 17 MCG/ACT inhaler Inhale 2 puffs into the lungs every 6 (six) hours as needed for wheezing.     Iron-Vitamin C 65-125 MG TABS Take by mouth.     Multiple Vitamins-Minerals (EQ COMPLETE MULTIVITAMIN-ADULT PO) Take by mouth.     nicotine polacrilex (NICORETTE) 4 MG gum  Take 1 each (4 mg total) by mouth as needed for smoking cessation. 270 tablet 0   Omega-3 Fatty Acids (FISH OIL) 1000 MG CAPS Take 3 capsules by mouth daily. 90 capsule 11   rosuvastatin (CRESTOR) 20 MG tablet TAKE 1 TABLET BY MOUTH DAILY 90 tablet 3   saccharomyces boulardii (FLORASTOR) 250 MG capsule Take 1 capsule (250 mg total) by mouth 2 (two) times daily. 60 capsule 11   sertraline (ZOLOFT) 100 MG tablet TAKE 1 AND 1/2 TABLETS BY MOUTH  DAILY 135 tablet 1   SPIRIVA HANDIHALER 18 MCG inhalation capsule INHALE THE CONTENTS OF 1 CAPSULE BY MOUTH VIA INHALATION DEVICE  ONCE DAILY AS DIRECTED 90 capsule 3   No current facility-administered medications for this visit.     Review of Systems  Please see the history of present illness.    (+)  Chronic shortness of breath (+) Weight gain  All other systems reviewed and are otherwise negative except as noted above.  Physical Exam    Wt Readings from Last 3 Encounters:  04/11/23 194 lb 12.8 oz (88.4 kg)  12/17/22 188 lb 12.8 oz (85.6 kg)  12/06/22 194 lb (88 kg)   ZO:[id number] were no vitals filed for this visit.,There is no height or weight on file to calculate BMI.  Constitutional:      Appearance: Healthy appearance. Not in distress.  Neck:     Vascular: JVD normal.  Pulmonary:     Effort: Pulmonary effort is normal.     Breath sounds: No wheezing. No rales. Diminished in the bases Cardiovascular:     Normal rate. Regular rhythm. Normal S1. Normal S2.      Murmurs: There is no murmur.  Edema:    Peripheral edema absent.  Abdominal:     Palpations: Abdomen is soft non tender. There is no hepatomegaly.  Skin:    General: Skin is warm and dry.  Neurological:     General: No focal deficit present.     Mental Status: Alert and oriented to person, place and time.     Cranial Nerves: Cranial nerves are intact.  EKG/LABS/ Recent Cardiac Studies    ECG personally reviewed by me today -none completed today  Lab Results  Component  Value Date   WBC 7.3 01/15/2022   HGB 15.5 (H) 01/15/2022   HCT 46.5 (H) 01/15/2022   MCV 93.7 01/15/2022   PLT 203.0 01/15/2022   Lab Results  Component Value Date   CREATININE 0.74 08/23/2022   BUN 25 08/23/2022   NA 142 08/23/2022   K 4.6 08/23/2022   CL 108 08/23/2022   CO2 26 08/23/2022   Lab Results  Component Value Date   ALT 12 08/23/2022   AST 12 08/23/2022   ALKPHOS 74 01/15/2022   BILITOT 0.6 08/23/2022   Lab Results  Component Value Date   CHOL 115 01/15/2022   HDL 44.00 01/15/2022   LDLCALC 53 01/15/2022   TRIG 91.0 01/15/2022   CHOLHDL 3 01/15/2022    Lab Results  Component Value Date   HGBA1C 5.7 01/15/2022     Assessment & Plan    1.Tricuspid regurgitation with RV dysfunction and pulmonary hypertension - s/p PFO closure and surgical debridement with history of endocarditis and 2007 complicated by cardiac tamponade requiring window -Echo completed 03/2022 EF of 55 to 60% with mildly dilated RA and trivial  MVR with mild to moderate tricuspid valve regurgitation -We will repeat 2D echo for surveillance of tricuspid regurgitation -Patient is euvolemic on examination today. -Low sodium diet, fluid restriction <2L, and daily weights encouraged. Educated to contact our office for weight gain of 2 lbs overnight or 5 lbs in one week.   2.  Chest pain: -Patient reports resolution of chest pain after reducing stress level and moving in with her daughter. -No further workup needed at this time  3.Hyperlipidemia:  -Patient will return to office for fasting LFTs and lipids -Previous LDL was controlled at 53 -Continue Crestor 20 mg daily and omega-3 fish oil 1000 mg 3 times daily  4.COPD: -Smoking cessation strongly advised -Patient is down to 2 to 3 cigarettes/day and is using Nicorette and nicotine lozenges.  5.  Carotid bruit: -Patient had possible carotid right carotid bruit auscultated -Patient will have carotid ultrasound completed for further  evaluation   Disposition: Follow-up with Dietrich Pates, MD or APP in  12 months    Medication Adjustments/Labs and Tests Ordered: Current medicines are reviewed at length with the patient today.  Concerns regarding medicines are outlined above.   Signed, Napoleon Form, Leodis Rains, NP 06/05/2023, 2:52 PM [facility]

## 2023-06-06 ENCOUNTER — Ambulatory Visit: Payer: 59 | Attending: Nurse Practitioner | Admitting: Nurse Practitioner

## 2023-06-06 ENCOUNTER — Encounter: Payer: Self-pay | Admitting: Nurse Practitioner

## 2023-06-06 ENCOUNTER — Ambulatory Visit: Payer: Medicare Other | Admitting: Adult Health

## 2023-06-06 VITALS — BP 138/82 | HR 90 | Ht 65.0 in | Wt 202.8 lb

## 2023-06-06 DIAGNOSIS — E785 Hyperlipidemia, unspecified: Secondary | ICD-10-CM | POA: Diagnosis not present

## 2023-06-06 DIAGNOSIS — I071 Rheumatic tricuspid insufficiency: Secondary | ICD-10-CM

## 2023-06-06 DIAGNOSIS — R072 Precordial pain: Secondary | ICD-10-CM

## 2023-06-06 DIAGNOSIS — J449 Chronic obstructive pulmonary disease, unspecified: Secondary | ICD-10-CM | POA: Diagnosis not present

## 2023-06-06 DIAGNOSIS — Z720 Tobacco use: Secondary | ICD-10-CM

## 2023-06-06 NOTE — Patient Instructions (Signed)
Medication Instructions:  Your physician recommends that you continue on your current medications as directed. Please refer to the Current Medication list given to you today. *If you need a refill on your cardiac medications before your next appointment, please call your pharmacy*   Lab Work: THIS WEEK LIPIDs & LFTs If you have labs (blood work) drawn today and your tests are completely normal, you will receive your results only by: MyChart Message (if you have MyChart) OR A paper copy in the mail If you have any lab test that is abnormal or we need to change your treatment, we will call you to review the results.   Testing/Procedures: Your physician has requested that you have an echocardiogram. Echocardiography is a painless test that uses sound waves to create images of your heart. It provides your doctor with information about the size and shape of your heart and how well your heart's chambers and valves are working. This procedure takes approximately one hour. There are no restrictions for this procedure. Please do NOT wear cologne, perfume, aftershave, or lotions (deodorant is allowed). Please arrive 15 minutes prior to your appointment time.  Your physician has requested that you have a carotid duplex. This test is an ultrasound of the carotid arteries in your neck. It looks at blood flow through these arteries that supply the brain with blood. Allow one hour for this exam. There are no restrictions or special instructions.   Follow-Up: At [facility], you and your health needs are our priority.  As part of our continuing mission to provide you with exceptional heart care, we have created designated Provider Care Teams.  These Care Teams include your primary Cardiologist (physician) and Advanced Practice Providers (APPs -  Physician Assistants and Nurse Practitioners) who all work together to provide you with the care you need, when you need it.  We recommend signing up for  the patient portal called "MyChart".  Sign up information is provided on this After Visit Summary.  MyChart is used to connect with patients for Virtual Visits (Telemedicine).  Patients are able to view lab/test results, encounter notes, upcoming appointments, etc.  Non-urgent messages can be sent to your provider as well.   To learn more about what you can do with MyChart, go to [email or web address].    Your next appointment:   12 month(s)  Provider:   Dietrich Pates, MD     Other Instructions

## 2023-06-08 ENCOUNTER — Encounter: Payer: Self-pay | Admitting: Adult Health

## 2023-06-08 ENCOUNTER — Ambulatory Visit: Payer: 59 | Attending: Nurse Practitioner

## 2023-06-08 DIAGNOSIS — I071 Rheumatic tricuspid insufficiency: Secondary | ICD-10-CM | POA: Diagnosis not present

## 2023-06-08 DIAGNOSIS — J449 Chronic obstructive pulmonary disease, unspecified: Secondary | ICD-10-CM

## 2023-06-08 DIAGNOSIS — Z720 Tobacco use: Secondary | ICD-10-CM | POA: Diagnosis not present

## 2023-06-08 DIAGNOSIS — R072 Precordial pain: Secondary | ICD-10-CM

## 2023-06-08 DIAGNOSIS — F419 Anxiety disorder, unspecified: Secondary | ICD-10-CM

## 2023-06-08 DIAGNOSIS — E785 Hyperlipidemia, unspecified: Secondary | ICD-10-CM

## 2023-06-10 MED ORDER — NICOTINE POLACRILEX 4 MG MT GUM
4.0000 mg | CHEWING_GUM | OROMUCOSAL | 0 refills | Status: DC | PRN
Start: 2023-06-10 — End: 2023-07-18

## 2023-06-10 NOTE — Telephone Encounter (Signed)
Pended Rx and sent to [facility] for approval.

## 2023-06-15 ENCOUNTER — Ambulatory Visit (HOSPITAL_COMMUNITY)
Admission: RE | Admit: 2023-06-15 | Discharge: 2023-06-15 | Disposition: A | Discharge status: Home / Self Care | Payer: 59 | Source: Ambulatory Visit | Attending: Nurse Practitioner | Admitting: Nurse Practitioner

## 2023-06-15 DIAGNOSIS — I071 Rheumatic tricuspid insufficiency: Secondary | ICD-10-CM | POA: Diagnosis not present

## 2023-06-15 DIAGNOSIS — Z720 Tobacco use: Secondary | ICD-10-CM | POA: Insufficient documentation

## 2023-06-15 DIAGNOSIS — J449 Chronic obstructive pulmonary disease, unspecified: Secondary | ICD-10-CM | POA: Insufficient documentation

## 2023-06-15 DIAGNOSIS — E785 Hyperlipidemia, unspecified: Secondary | ICD-10-CM | POA: Insufficient documentation

## 2023-06-15 DIAGNOSIS — R072 Precordial pain: Secondary | ICD-10-CM | POA: Insufficient documentation

## 2023-06-15 DIAGNOSIS — R0989 Other specified symptoms and signs involving the circulatory and respiratory systems: Secondary | ICD-10-CM | POA: Insufficient documentation

## 2023-06-16 ENCOUNTER — Other Ambulatory Visit: Payer: Self-pay

## 2023-06-23 MED ORDER — DIAZEPAM 5 MG PO TABS
5.0000 mg | ORAL_TABLET | Freq: Two times a day (BID) | ORAL | 0 refills | Status: DC | PRN
Start: 2023-06-23 — End: 2023-07-18

## 2023-06-23 NOTE — Addendum Note (Signed)
Addended by: Elveria Royals on: 06/23/2023 12:48 PM   Modules accepted: Orders

## 2023-06-28 ENCOUNTER — Ambulatory Visit (HOSPITAL_COMMUNITY): Payer: 59 | Attending: Cardiology

## 2023-06-28 DIAGNOSIS — R072 Precordial pain: Secondary | ICD-10-CM | POA: Insufficient documentation

## 2023-06-28 DIAGNOSIS — E785 Hyperlipidemia, unspecified: Secondary | ICD-10-CM | POA: Insufficient documentation

## 2023-06-28 DIAGNOSIS — Z720 Tobacco use: Secondary | ICD-10-CM | POA: Insufficient documentation

## 2023-06-28 DIAGNOSIS — I361 Nonrheumatic tricuspid (valve) insufficiency: Secondary | ICD-10-CM

## 2023-06-28 DIAGNOSIS — I071 Rheumatic tricuspid insufficiency: Secondary | ICD-10-CM | POA: Diagnosis not present

## 2023-06-28 DIAGNOSIS — J449 Chronic obstructive pulmonary disease, unspecified: Secondary | ICD-10-CM | POA: Insufficient documentation

## 2023-06-28 LAB — ECHOCARDIOGRAM COMPLETE
Area-P 1/2: 3.72 cm2
S' Lateral: 2.8 cm

## 2023-06-30 ENCOUNTER — Other Ambulatory Visit: Payer: Self-pay

## 2023-06-30 DIAGNOSIS — J449 Chronic obstructive pulmonary disease, unspecified: Secondary | ICD-10-CM

## 2023-06-30 DIAGNOSIS — I071 Rheumatic tricuspid insufficiency: Secondary | ICD-10-CM

## 2023-06-30 MED ORDER — SPIRONOLACTONE 25 MG PO TABS
12.5000 mg | ORAL_TABLET | Freq: Every day | ORAL | 1 refills | Status: DC
Start: 2023-06-30 — End: 2024-02-16

## 2023-06-30 MED ORDER — FUROSEMIDE 20 MG PO TABS: 20.0000 mg | ORAL_TABLET | Freq: Every day | ORAL | 1 refills | Status: DC

## 2023-07-01 ENCOUNTER — Other Ambulatory Visit: Payer: Self-pay

## 2023-07-12 ENCOUNTER — Ambulatory Visit: Payer: 59 | Attending: Adult Health

## 2023-07-15 ENCOUNTER — Encounter: Payer: 59 | Admitting: Adult Health

## 2023-07-15 NOTE — Progress Notes (Signed)
This encounter was created in error - please disregard.

## 2023-07-18 ENCOUNTER — Encounter: Payer: Self-pay | Admitting: Adult Health

## 2023-07-18 ENCOUNTER — Ambulatory Visit (INDEPENDENT_AMBULATORY_CARE_PROVIDER_SITE_OTHER): Payer: 59 | Admitting: Adult Health

## 2023-07-18 ENCOUNTER — Other Ambulatory Visit: Payer: Self-pay | Admitting: Adult Health

## 2023-07-18 ENCOUNTER — Ambulatory Visit: Payer: 59 | Attending: Nurse Practitioner

## 2023-07-18 VITALS — BP 121/88 | HR 87 | Temp 97.3°F | Resp 18 | Ht 65.0 in | Wt 198.4 lb

## 2023-07-18 DIAGNOSIS — K219 Gastro-esophageal reflux disease without esophagitis: Secondary | ICD-10-CM | POA: Diagnosis not present

## 2023-07-18 DIAGNOSIS — I071 Rheumatic tricuspid insufficiency: Secondary | ICD-10-CM | POA: Diagnosis not present

## 2023-07-18 DIAGNOSIS — E785 Hyperlipidemia, unspecified: Secondary | ICD-10-CM

## 2023-07-18 DIAGNOSIS — F32A Depression, unspecified: Secondary | ICD-10-CM

## 2023-07-18 DIAGNOSIS — F419 Anxiety disorder, unspecified: Secondary | ICD-10-CM

## 2023-07-18 DIAGNOSIS — J449 Chronic obstructive pulmonary disease, unspecified: Secondary | ICD-10-CM | POA: Diagnosis not present

## 2023-07-18 DIAGNOSIS — Z720 Tobacco use: Secondary | ICD-10-CM

## 2023-07-18 DIAGNOSIS — Z23 Encounter for immunization: Secondary | ICD-10-CM

## 2023-07-18 LAB — BASIC METABOLIC PANEL
BUN/Creatinine Ratio: 22 (ref 9–23)
BUN: 20 mg/dL (ref 6–24)
CO2: 25 mmol/L (ref 20–29)
Calcium: 10.2 mg/dL (ref 8.7–10.2)
Chloride: 102 mmol/L (ref 96–106)
Creatinine, Ser: 0.89 mg/dL (ref 0.57–1.00)
Glucose: 83 mg/dL (ref 70–99)
Potassium: 4.6 mmol/L (ref 3.5–5.2)
Sodium: 135 mmol/L (ref 134–144)
eGFR: 78 mL/min/{1.73_m2} (ref >59)

## 2023-07-18 MED ORDER — NICOTINE POLACRILEX 4 MG MT GUM
4.0000 mg | CHEWING_GUM | OROMUCOSAL | 0 refills | Status: DC | PRN
Start: 2023-07-18 — End: 2023-08-18

## 2023-07-18 MED ORDER — DIAZEPAM 5 MG PO TABS
5.0000 mg | ORAL_TABLET | Freq: Two times a day (BID) | ORAL | 0 refills | Status: DC | PRN
Start: 2023-07-22 — End: 2023-08-15

## 2023-07-18 NOTE — Addendum Note (Signed)
Addended by: Nelda Severe A on: 07/18/2023 09:03 AM   Modules accepted: Orders

## 2023-07-18 NOTE — Progress Notes (Unsigned)
[facility] clinic  Provider:   Code Status: *** Goals of Care:     07/18/2023   11:46 AM  Advanced Directives  Does Patient Have a Medical Advance Directive? No  Would patient like information on creating a medical advance directive? No - Patient declined     Chief Complaint  Patient presents with   Acute Visit    extreme tiredness and cough    HPI: Patient is a 52 y.o. female seen today for an acute visit for    Wt Readings from Last 3 Encounters:  07/18/23 198 lb 6.4 oz (90 kg)  06/06/23 202 lb 12.8 oz (92 kg)  04/11/23 194 lb 12.8 oz (88.4 kg)     Past Medical History:  Diagnosis Date   Anxiety    ARF (acute renal failure) (HCC)    a. 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   Arthritis    "knees" (05/09/2013)   Asthma    Cardiac arrest (HCC)    a. 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   Cardiac tamponade    a. s/p subxiphoid window 2007 in setting of TV endocarditis. b. 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 s/p closure in 2007.   Septic pulmonary embolism (HCC)    a. During admission for TV  endocarditis.   Severe tricuspid regurgitation    a. By TEE/cath 04/2013 - for outpt referral to [facility].    Tobacco abuse     Past Surgical History:  Procedure Laterality Date   CARDIAC CATHETERIZATION     multiple/notes 06/15/2006 (05/09/2013)   DILATION AND CURETTAGE OF UTERUS     "couple of those years ago; one a couple years ago after I lost a pregnancy" (05/09/2013)   LEFT AND RIGHT HEART CATHETERIZATION WITH CORONARY ANGIOGRAM N/A 05/08/2013   Procedure: LEFT AND RIGHT HEART CATHETERIZATION WITH CORONARY ANGIOGRAM;  Surgeon: Kathleene Hazel, MD;  Location: [facility] CATH LAB;  Service: Cardiovascular;  Laterality: N/A;   PATENT FORAMEN OVALE CLOSURE  04/22/2006   closed during tricuspid valve surgery   SUBXYPHOID PERICARDIAL WINDOW  05/07/2006   for large postoperative pericardial effusion    TEE WITHOUT CARDIOVERSION N/A 05/08/2013   Procedure: TRANSESOPHAGEAL ECHOCARDIOGRAM (TEE);  Surgeon: Lewayne Bunting, MD;  Location: [facility] ENDOSCOPY;  Service: Cardiovascular;  Laterality: N/A;   TRICUSPID VALVE SURGERY  04/22/2006   debridement of septal leaflet for MSSA bacterial endocarditis with closure PFO    Allergies  Allergen Reactions   Ivp Dye [Iodinated Contrast Media] Shortness Of Breath   Amoxicillin Diarrhea and Other (See Comments)    "  I get very sick with this medication"   Latex Rash    " I'm allergic to latex, and it breaks by skin out in an itchy rash"   Penicillins Rash    Outpatient Encounter Medications as of 07/18/2023  Medication Sig   buPROPion (WELLBUTRIN XL) 150 MG 24 hr tablet TAKE 1 TABLET BY MOUTH DAILY   calcium carbonate (CALCIUM 600) 600 MG TABS tablet Take 1 tablet (600 mg total) by mouth 2 (two) times daily with a meal.   Cholecalciferol (VITAMIN D3) 1000 units CAPS Take 25 mg by mouth daily.   [START ON 07/22/2023] diazepam (VALIUM) 5 MG tablet Take 1 tablet (5 mg total) by mouth every 12 (twelve) hours as needed. for anxiety   famotidine (PEPCID) 20 MG tablet TAKE  1 TABLET BY MOUTH DAILY AS  NEEDED FOR HEARTBURN   furosemide (LASIX) 20 MG tablet Take 1 tablet (20 mg total) by mouth daily.   ipratropium (ATROVENT HFA) 17 MCG/ACT inhaler Inhale 2 puffs into the lungs every 6 (six) hours as needed for wheezing.   Iron-Vitamin C 65-125 MG TABS Take by mouth.   Multiple Vitamins-Minerals (EQ COMPLETE MULTIVITAMIN-ADULT PO) Take by mouth.   nicotine polacrilex (NICORETTE) 4 MG gum Take 1 each (4 mg total) by mouth as needed for smoking cessation.   Omega-3 Fatty Acids (FISH OIL) 1000 MG CAPS Take 3 capsules by mouth daily.   rosuvastatin (CRESTOR) 20 MG tablet TAKE 1 TABLET BY MOUTH DAILY   sertraline (ZOLOFT) 100 MG tablet TAKE 1 AND 1/2 TABLETS BY MOUTH  DAILY   SPIRIVA HANDIHALER 18 MCG inhalation capsule INHALE THE CONTENTS OF 1 CAPSULE BY MOUTH VIA INHALATION DEVICE  ONCE DAILY AS DIRECTED   spironolactone (ALDACTONE) 25 MG tablet Take 0.5 tablets (12.5 mg total) by mouth daily.   [DISCONTINUED] saccharomyces boulardii (FLORASTOR) 250 MG capsule Take 1 capsule (250 mg total) by mouth 2 (two) times daily.   No facility-administered encounter medications on file as of 07/18/2023.    Review of Systems:  Review of Systems  Constitutional:  Negative for appetite change, chills, fatigue and fever.  HENT:  Negative for congestion, hearing loss, rhinorrhea and sore throat.   Eyes: Negative.   Respiratory:  Negative for cough, shortness of breath and wheezing.   Cardiovascular:  Negative for chest pain, palpitations and leg swelling.  Gastrointestinal:  Negative for abdominal pain, constipation, diarrhea, nausea and vomiting.  Genitourinary:  Negative for dysuria.  Musculoskeletal:  Negative for arthralgias, back pain and myalgias.  Skin:  Negative for color change, rash and wound.  Neurological:  Negative for dizziness, weakness and headaches.  Psychiatric/Behavioral:  Negative for behavioral problems. The patient is not nervous/anxious.     Health  Maintenance  Topic Date Due   COVID-19 Vaccine (3 - Moderna risk series) 03/19/2020   Zoster Vaccines- Shingrix (2 of 2) 12/17/2021   INFLUENZA VACCINE  06/02/2023   Lung Cancer Screening  09/28/2023   DTaP/Tdap/Td (2 - Td or Tdap) 01/17/2024   Cervical Cancer Screening (HPV/Pap Cotest)  01/31/2024   Medicare Annual Wellness (AWV)  02/21/2024   MAMMOGRAM  05/17/2024   Colonoscopy  07/07/2031   Hepatitis C Screening  Completed   HIV Screening  Completed   HPV VACCINES  Aged Out    Physical Exam: Vitals:   07/18/23 1145  BP: 121/88  Pulse: 87  Resp: 18  Temp: (!) 97.3 F (36.3 C)  SpO2: 93%  Weight: 198 lb 6.4 oz (90 kg)  Height:  5\' 5"  (1.651 m)   Body mass index is 33.02 kg/m. Physical Exam Constitutional:      General: She is not in acute distress.    Appearance: Normal appearance. She is obese.  HENT:     Head: Normocephalic and atraumatic.     Nose: Nose normal.     Mouth/Throat:     Mouth: Mucous membranes are moist.  Eyes:     Conjunctiva/sclera: Conjunctivae normal.  Cardiovascular:     Rate and Rhythm: Normal rate and regular rhythm.  Pulmonary:     Effort: Pulmonary effort is normal.     Breath sounds: Normal breath sounds.  Abdominal:     General: Bowel sounds are normal.     Palpations: Abdomen is soft.  Musculoskeletal:        General: Normal range of motion.     Cervical back: Normal range of motion.  Skin:    General: Skin is warm and dry.  Neurological:     General: No focal deficit present.     Mental Status: She is alert and oriented to person, place, and time.  Psychiatric:        Mood and Affect: Mood normal.        Behavior: Behavior normal.        Thought Content: Thought content normal.        Judgment: Judgment normal.     Labs reviewed: Basic Metabolic Panel: Recent Labs    08/23/22 1218  NA 142  K 4.6  CL 108  CO2 26  GLUCOSE 77  BUN 25  CREATININE 0.74  CALCIUM 9.3   Liver Function Tests: Recent Labs     08/23/22 1218 06/08/23 1523  AST 12 17  ALT 12 18  ALKPHOS  --  83  BILITOT 0.6 0.4  PROT 6.4 6.3  ALBUMIN  --  4.0   No results for input(s): "LIPASE", "AMYLASE" in the last 8760 hours. No results for input(s): "AMMONIA" in the last 8760 hours. CBC: No results for input(s): "WBC", "NEUTROABS", "HGB", "HCT", "MCV", "PLT" in the last 8760 hours. Lipid Panel: Recent Labs    06/08/23 1523  CHOL 126  HDL 46  LDLCALC 46  TRIG 217*  CHOLHDL 2.7   Lab Results  Component Value Date   HGBA1C 5.7 01/15/2022    Procedures since last visit: ECHOCARDIOGRAM COMPLETE  Result Date: 06/28/2023    ECHOCARDIOGRAM REPORT   Patient Name:   Tina Fitzgerald Date of Exam: 06/28/2023 Medical Rec #:  [id number]      Height:       65.0 in Accession #:    [id number]     Weight:       202.8 lb Date of Birth:  1971-08-24      BSA:          1.990 m Patient Age:    52 years       BP:           138/82 mmHg Patient Gender: F              HR:           80 bpm. Exam Location:  [address] Procedure: 2D Echo, Cardiac Doppler and Color Doppler Indications:    I07.1 Tricuspid regurgitation  History:        Patient has prior history of Echocardiogram examinations, most                 recent 03/16/2022. COPD, TR, Arrythmias:Cardiac  Arrest,                 Signs/Symptoms:Chest Pain; Risk Factors:Dyslipidemia, Current                 Smoker and H/o polysubstance abuse.  Sonographer:    Samule Ohm RDCS Referring Phys: [phone] [place]pe, JR DICK  Sonographer Comments: Technically difficult study due to poor echo windows. IMPRESSIONS  1. Left ventricular ejection fraction, by estimation, is 55 to 60%. The left ventricle has normal function. The left ventricle has no regional wall motion abnormalities. Left ventricular diastolic parameters are consistent with Grade I diastolic dysfunction (impaired relaxation).  2. Right ventricular systolic function is normal. The right ventricular size is moderately enlarged. There is mildly  elevated pulmonary artery systolic pressure. The estimated right ventricular systolic pressure is 42.6 mmHg.  3. Right atrial size was severely dilated.  4. The mitral valve is normal in structure. Trivial mitral valve regurgitation. No evidence of mitral stenosis.  5. Tricuspid valve regurgitation is severe.  6. The aortic valve is tricuspid. There is mild calcification of the aortic valve. Aortic valve regurgitation is not visualized. No aortic stenosis is present.  7. The inferior vena cava is normal in size with <50% respiratory variability, suggesting right atrial pressure of 8 mmHg. FINDINGS  Left Ventricle: Left ventricular ejection fraction, by estimation, is 55 to 60%. The left ventricle has normal function. The left ventricle has no regional wall motion abnormalities. The left ventricular internal cavity size was normal in size. There is  no left ventricular hypertrophy. Left ventricular diastolic parameters are consistent with Grade I diastolic dysfunction (impaired relaxation). Right Ventricle: The right ventricular size is moderately enlarged. No increase in right ventricular wall thickness. Right ventricular systolic function is normal. There is mildly elevated pulmonary artery systolic pressure. The tricuspid regurgitant velocity is 2.94 m/s, and with an assumed right atrial pressure of 8 mmHg, the estimated right ventricular systolic pressure is 42.6 mmHg. Left Atrium: Left atrial size was normal in size. Right Atrium: Right atrial size was severely dilated. Pericardium: There is no evidence of pericardial effusion. Mitral Valve: The mitral valve is normal in structure. Trivial mitral valve regurgitation. No evidence of mitral valve stenosis. Tricuspid Valve: The tricuspid valve is not well visualized. Tricuspid valve regurgitation is severe. No evidence of tricuspid stenosis. Aortic Valve: The aortic valve is tricuspid. There is mild calcification of the aortic valve. Aortic valve regurgitation is  not visualized. No aortic stenosis is present. Pulmonic Valve: The pulmonic valve was normal in structure. Pulmonic valve regurgitation is not visualized. No evidence of pulmonic stenosis. Aorta: The aortic root is normal in size and structure. Venous: The inferior vena cava is normal in size with less than 50% respiratory variability, suggesting right atrial pressure of 8 mmHg. IAS/Shunts: There is left bowing of the interatrial septum, suggestive of elevated right atrial pressure. No atrial level shunt detected by color flow Doppler.  LEFT VENTRICLE PLAX 2D LVIDd:         4.10 cm   Diastology LVIDs:         2.80 cm   LV e' medial:    11.90 cm/s LV PW:         1.10 cm   LV E/e' medial:  3.8 LV IVS:        1.10 cm   LV e' lateral:   20.20 cm/s LVOT diam:     2.00 cm   LV E/e' lateral: 2.2 LV SV:  40 LV SV Index:   20 LVOT Area:     3.14 cm  RIGHT VENTRICLE             IVC RV S prime:     13.10 cm/s  IVC diam: 1.80 cm TAPSE (M-mode): 1.6 cm RVSP:           37.6 mmHg LEFT ATRIUM             Index        RIGHT ATRIUM           Index LA diam:        3.80 cm 1.91 cm/m   RA Pressure: 3.00 mmHg LA Vol (A2C):   38.8 ml 19.50 ml/m  RA Area:     23.40 cm LA Vol (A4C):   30.3 ml 15.23 ml/m  RA Volume:   72.30 ml  36.33 ml/m LA Biplane Vol: 37.0 ml 18.59 ml/m  AORTIC VALVE LVOT Vmax:   70.80 cm/s LVOT Vmean:  47.300 cm/s LVOT VTI:    0.126 m  AORTA Ao Root diam: 3.20 cm Ao Asc diam:  2.85 cm MITRAL VALVE               TRICUSPID VALVE MV Area (PHT): 3.72 cm    TR Peak grad:   34.6 mmHg MV Decel Time: 204 msec    TR Vmax:        294.00 cm/s MV E velocity: 45.20 cm/s  Estimated RAP:  3.00 mmHg MV A velocity: 62.90 cm/s  RVSP:           37.6 mmHg MV E/A ratio:  0.72                            SHUNTS                            Systemic VTI:  0.13 m                            Systemic Diam: 2.00 cm Arvilla Meres MD Electronically signed by Arvilla Meres MD Signature Date/Time: 06/28/2023/5:52:39 PM    Final      Assessment/Plan      Labs/tests ordered:    Next appt:  07/20/2023

## 2023-07-20 ENCOUNTER — Ambulatory Visit: Payer: 59 | Admitting: Adult Health

## 2023-07-25 ENCOUNTER — Encounter: Payer: Self-pay | Admitting: Adult Health

## 2023-07-25 MED ORDER — ZOSTER VAC RECOMB ADJUVANTED 50 MCG/0.5ML IM SUSR: 0.5000 mL | Freq: Once | INTRAMUSCULAR | 0 refills | Status: AC

## 2023-07-25 NOTE — Telephone Encounter (Signed)
Pended Order and sent to [facility] for approval.

## 2023-07-27 ENCOUNTER — Other Ambulatory Visit: Payer: Self-pay | Admitting: Adult Health

## 2023-07-27 DIAGNOSIS — J449 Chronic obstructive pulmonary disease, unspecified: Secondary | ICD-10-CM

## 2023-07-27 DIAGNOSIS — G8929 Other chronic pain: Secondary | ICD-10-CM

## 2023-07-27 NOTE — Telephone Encounter (Signed)
Order for shingrix was sent to [facility], [facility] sent a fax saying the order needs to be sent to a local pharmacy as they do not provider shingrix vaccines for patient

## 2023-08-09 ENCOUNTER — Other Ambulatory Visit: Payer: Self-pay | Admitting: Adult Health

## 2023-08-09 DIAGNOSIS — F320 Major depressive disorder, single episode, mild: Secondary | ICD-10-CM

## 2023-08-13 ENCOUNTER — Other Ambulatory Visit: Payer: Self-pay | Admitting: Adult Health

## 2023-08-13 ENCOUNTER — Other Ambulatory Visit: Payer: Self-pay | Admitting: Nurse Practitioner

## 2023-08-13 DIAGNOSIS — F419 Anxiety disorder, unspecified: Secondary | ICD-10-CM

## 2023-08-18 ENCOUNTER — Encounter: Payer: Self-pay | Admitting: Family

## 2023-08-18 ENCOUNTER — Ambulatory Visit (INDEPENDENT_AMBULATORY_CARE_PROVIDER_SITE_OTHER): Payer: 59 | Admitting: Family

## 2023-08-18 VITALS — BP 118/80 | HR 96 | Temp 98.3°F | Resp 18 | Ht 65.0 in | Wt 196.6 lb

## 2023-08-18 DIAGNOSIS — J449 Chronic obstructive pulmonary disease, unspecified: Secondary | ICD-10-CM | POA: Diagnosis not present

## 2023-08-18 DIAGNOSIS — R7303 Prediabetes: Secondary | ICD-10-CM

## 2023-08-18 DIAGNOSIS — F419 Anxiety disorder, unspecified: Secondary | ICD-10-CM

## 2023-08-18 DIAGNOSIS — E785 Hyperlipidemia, unspecified: Secondary | ICD-10-CM | POA: Diagnosis not present

## 2023-08-18 DIAGNOSIS — Z720 Tobacco use: Secondary | ICD-10-CM | POA: Diagnosis not present

## 2023-08-18 MED ORDER — NICOTINE POLACRILEX 4 MG MT GUM
4.0000 mg | CHEWING_GUM | OROMUCOSAL | 0 refills | Status: DC | PRN
Start: 2023-08-18 — End: 2024-02-16

## 2023-08-18 NOTE — Progress Notes (Signed)
Provider: Carilyn Goodpasture Moya Duan FNP-C   Medina-Vargas, Margit Banda, NP  Patient Care Team: Gillis Santa, NP as PCP - General (Internal Medicine) Pricilla Riffle, MD as PCP - Cardiology (Cardiology) Kathleene Hazel, MD as Attending Physician (Cardiology) Purcell Nails, MD (Inactive) as Attending Physician (Cardiothoracic Surgery)  Extended Emergency Contact Information Primary Emergency Contact: [facility] Phone: [phone] Relation: Daughter Secondary Emergency Contact: Maurilio Lovely, [place] Home Phone: [phone] Mobile Phone: [phone] Relation: Brother  Code Status:  Full Code  Goals of care: Advanced Directive information    07/18/2023   11:46 AM  Advanced Directives  Does Patient Have a Medical Advance Directive? No  Would patient like information on creating a medical advance directive? No - Patient declined     Chief Complaint  Patient presents with   Medical Management of Chronic Issues    Patient presents today for a month follow-up   Quality Metric Gaps    Zoster, COVID#3    HPI:  Pt is a 52 y.o. female seen today for one month follow up appointment for medical management of chronic diseases.She was seen on 07/18/2023 by Lucius Conn - Vargus Monina,NP.  Previous visit notes were reviewed and clear follow-up visit today. Generalized anxiety - follows up with psychiatry   States did not get her nicorette from last prescription.  Smokes about 3 cigarettes/day.  He will drive like to restart Nicorette gum. He denies any new acute issues this visit  Past Medical History:  Diagnosis Date   Anxiety    ARF (acute renal failure) (HCC)    a. 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   Arthritis    "knees" (05/09/2013)   Asthma    Cardiac arrest (HCC)    a. 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   Cardiac tamponade    a. s/p subxiphoid  window 2007 in setting of TV endocarditis. b. 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 s/p closure in 2007.   Septic pulmonary embolism (HCC)    a. During admission for TV endocarditis.   Severe tricuspid regurgitation    a. By TEE/cath 04/2013 - for outpt referral to [facility].    Tobacco abuse    Past Surgical History:  Procedure Laterality Date   CARDIAC CATHETERIZATION     multiple/notes 06/15/2006 (05/09/2013)   DILATION AND CURETTAGE OF UTERUS     "couple of those years ago; one a couple years ago after I lost a pregnancy" (05/09/2013)   LEFT AND RIGHT HEART CATHETERIZATION WITH  CORONARY ANGIOGRAM N/A 05/08/2013   Procedure: LEFT AND RIGHT HEART CATHETERIZATION WITH CORONARY ANGIOGRAM;  Surgeon: Kathleene Hazel, MD;  Location: [facility] CATH LAB;  Service: Cardiovascular;  Laterality: N/A;   PATENT FORAMEN OVALE CLOSURE  04/22/2006   closed during tricuspid valve surgery   SUBXYPHOID PERICARDIAL WINDOW  05/07/2006   for large postoperative pericardial effusion    TEE WITHOUT CARDIOVERSION N/A 05/08/2013   Procedure: TRANSESOPHAGEAL ECHOCARDIOGRAM (TEE);  Surgeon: Lewayne Bunting, MD;  Location: [facility]  ENDOSCOPY;  Service: Cardiovascular;  Laterality: N/A;   TRICUSPID VALVE SURGERY  04/22/2006   debridement of septal leaflet for MSSA bacterial endocarditis with closure PFO    Allergies  Allergen Reactions   Ivp Dye [Iodinated Contrast Media] Shortness Of Breath   Amoxicillin Diarrhea and Other (See Comments)    " I get very sick with this medication"   Latex Rash    " I'm allergic to latex, and it breaks by skin out in an itchy rash"   Penicillins Rash    Allergies as of 08/18/2023       Reactions   Ivp Dye [iodinated Contrast Media] Shortness Of Breath   Amoxicillin Diarrhea, Other (See Comments)   " I get very sick with this medication"   Latex Rash   " I'm allergic to latex, and it breaks by skin out in an itchy rash"   Penicillins Rash        Medication List        Accurate as of August 18, 2023  2:36 PM. If you have any questions, ask your nurse or doctor.          buPROPion 150 MG 24 hr tablet Commonly known as: WELLBUTRIN XL TAKE 1 TABLET BY MOUTH DAILY   calcium carbonate 600 MG Tabs tablet Commonly known as: Calcium 600 Take 1 tablet (600 mg total) by mouth 2 (two) times daily with a meal.   diazepam 5 MG tablet Commonly known as: VALIUM TAKE 1 TABLET BY MOUTH EVERY 12  HOURS AS NEEDED FOR ANXIETY   EQ COMPLETE MULTIVITAMIN-ADULT PO Take by mouth.   famotidine 20 MG tablet Commonly known as: PEPCID TAKE 1 TABLET BY MOUTH DAILY AS  NEEDED FOR HEARTBURN   Fish Oil 1000 MG Caps Take 3 capsules by mouth daily.   furosemide 20 MG tablet Commonly known as: LASIX TAKE 1 TABLET BY MOUTH DAILY   ipratropium 17 MCG/ACT inhaler Commonly known as: ATROVENT HFA Inhale 2 puffs into the lungs every 6 (six) hours as needed for wheezing.   Iron-Vitamin C 65-125 MG Tabs Take by mouth.   nicotine polacrilex 4 MG gum Commonly known as: NICORETTE Take 1 each (4 mg total) by mouth as needed for smoking cessation.   rosuvastatin 20 MG tablet Commonly  known as: CRESTOR TAKE 1 TABLET BY MOUTH DAILY   sertraline 100 MG tablet Commonly known as: ZOLOFT TAKE 1 AND 1/2 TABLETS BY MOUTH  DAILY   Spiriva HandiHaler 18 MCG inhalation capsule Generic drug: tiotropium INHALE THE CONTENTS OF 1 CAPSULE BY MOUTH VIA INHALATION DEVICE  ONCE DAILY AS DIRECTED   spironolactone 25 MG tablet Commonly known as: ALDACTONE Take 0.5 tablets (12.5 mg total) by mouth daily.   Vitamin D3 1000 units Caps Take 25 mg by mouth daily.        Review of Systems  Constitutional:  Negative for appetite change, chills, fatigue, fever and unexpected weight change.  HENT:  Negative for congestion, dental problem, ear discharge, ear  pain, facial swelling, hearing loss, nosebleeds, postnasal drip, rhinorrhea, sinus pressure, sinus pain, sneezing, sore throat, tinnitus and trouble swallowing.   Eyes:  Negative for pain, discharge, redness, itching and visual disturbance.  Respiratory:  Negative for cough, chest tightness, shortness of breath and wheezing.   Cardiovascular:  Negative for chest pain, palpitations and leg swelling.  Gastrointestinal:  Negative for abdominal distention, abdominal pain, blood in stool, constipation, diarrhea, nausea and vomiting.  Musculoskeletal:  Negative for arthralgias, back pain, gait problem, joint swelling, myalgias, neck pain and neck stiffness.  Skin:  Negative for color change, pallor, rash and wound.  Neurological:  Negative for dizziness, weakness, light-headedness, numbness and headaches.  Psychiatric/Behavioral:  Negative for agitation, behavioral problems, confusion, hallucinations and sleep disturbance. The patient is not nervous/anxious.     Immunization History  Administered Date(s) Administered   Influenza Split 08/01/2013   Influenza, Seasonal, Injecte, Preservative Fre 07/20/2019   Influenza,inj,Quad PF,6+ Mos 08/22/2014, 10/22/2021, 09/17/2022   Influenza-Unspecified 07/18/2023   Moderna Sars-Covid-2 Vaccination  01/23/2020, 02/20/2020   PNEUMOCOCCAL CONJUGATE-20 06/01/2022   Pneumococcal Polysaccharide-23 04/17/2014   Tdap 01/16/2014   Zoster Recombinant(Shingrix) 10/22/2021   Pertinent  Health Maintenance Due  Topic Date Due   MAMMOGRAM  05/17/2024   Colonoscopy  07/07/2031   INFLUENZA VACCINE  Completed      11/16/2021    7:19 PM 06/15/2022    2:51 PM 06/15/2022    3:18 PM 04/11/2023   10:10 AM 07/18/2023    1:16 PM  Fall Risk  Falls in the past year?  1 1 0 0  Was there an injury with Fall?  1 1 0 0  Fall Risk Category Calculator  3 2 0 0  Fall Risk Category (Retired)  High Moderate    (RETIRED) Patient Fall Risk Level Low fall risk  Moderate fall risk    Patient at Risk for Falls Due to   Impaired vision No Fall Risks No Fall Risks  Fall risk Follow up   Falls evaluation completed;Education provided Falls evaluation completed Falls evaluation completed   Functional Status Survey:    Vitals:   08/18/23 1416  BP: 118/80  Pulse: 96  Resp: 18  Temp: 98.3 F (36.8 C)  SpO2: 97%  Weight: 196 lb 9.6 oz (89.2 kg)  Height: 5\' 5"  (1.651 m)   Body mass index is 32.72 kg/m. Physical Exam Vitals reviewed.  Constitutional:      General: She is not in acute distress.    Appearance: Normal appearance. She is obese. She is not ill-appearing or diaphoretic.  HENT:     Head: Normocephalic.     Nose: Nose normal. No congestion or rhinorrhea.     Mouth/Throat:     Mouth: Mucous membranes are moist.     Pharynx: Oropharynx is clear. No oropharyngeal exudate or posterior oropharyngeal erythema.  Eyes:     General: No scleral icterus.       Right eye: No discharge.        Left eye: No discharge.     Conjunctiva/sclera: Conjunctivae normal.     Pupils: Pupils are equal, round, and reactive to light.  Neck:     Vascular: No carotid bruit.  Cardiovascular:     Rate and Rhythm: Normal rate and regular rhythm.     Pulses: Normal pulses.     Heart sounds: Normal heart sounds. No murmur  heard.    No friction rub. No gallop.  Pulmonary:     Effort: Pulmonary effort is normal.  No respiratory distress.     Breath sounds: Normal breath sounds. No wheezing, rhonchi or rales.  Chest:     Chest wall: No tenderness.  Abdominal:     General: Bowel sounds are normal. There is no distension.     Palpations: Abdomen is soft. There is no mass.     Tenderness: There is no abdominal tenderness. There is no right CVA tenderness, left CVA tenderness, guarding or rebound.  Musculoskeletal:        General: No swelling or tenderness. Normal range of motion.     Cervical back: Normal range of motion. No rigidity or tenderness.     Right lower leg: No edema.     Left lower leg: No edema.  Lymphadenopathy:     Cervical: No cervical adenopathy.  Skin:    General: Skin is warm and dry.     Coloration: Skin is not pale.     Findings: No bruising, erythema, lesion or rash.  Neurological:     Mental Status: She is alert and oriented to person, place, and time.     Cranial Nerves: No cranial nerve deficit.     Sensory: No sensory deficit.     Motor: No weakness.     Coordination: Coordination normal.     Gait: Gait normal.  Psychiatric:        Mood and Affect: Mood normal.        Speech: Speech normal.        Behavior: Behavior normal.        Thought Content: Thought content normal.        Judgment: Judgment normal.     Labs reviewed: Recent Labs    08/23/22 1218 07/18/23 1431  NA 142 135  K 4.6 4.6  CL 108 102  CO2 26 25  GLUCOSE 77 83  BUN 25 20  CREATININE 0.74 0.89  CALCIUM 9.3 10.2   Recent Labs    08/23/22 1218 06/08/23 1523  AST 12 17  ALT 12 18  ALKPHOS  --  83  BILITOT 0.6 0.4  PROT 6.4 6.3  ALBUMIN  --  4.0   No results for input(s): "WBC", "NEUTROABS", "HGB", "HCT", "MCV", "PLT" in the last 8760 hours. Lab Results  Component Value Date   TSH 1.30 01/15/2022   Lab Results  Component Value Date   HGBA1C 5.7 01/15/2022   Lab Results  Component  Value Date   CHOL 126 06/08/2023   HDL 46 06/08/2023   LDLCALC 46 06/08/2023   TRIG 217 (H) 06/08/2023   CHOLHDL 2.7 06/08/2023    Significant Diagnostic Results in last 30 days:  No results found.  Assessment/Plan 1. Tobacco abuse Smoking cessation advised.  Will restart Nicorette gum as below side effects discussed. - nicotine polacrilex (NICORETTE) 4 MG gum; Take 1 each (4 mg total) by mouth as needed for smoking cessation.  Dispense: 270 tablet; Refill: 0  2. Hyperlipidemia, unspecified hyperlipidemia type Total cholesterol and LDL at goal but elevated triglycerides.  Dietary modification and exercise advised at length. - Lipid panel; Future  3. Prediabetes Lab Results  Component Value Date   HGBA1C 5.7 01/15/2022  Dietary modification and exercise advised - COMPLETE METABOLIC PANEL WITH GFR; Future - CBC with Differential/Platelet; Future  4. Chronic obstructive pulmonary disease, unspecified COPD type (HCC) Breathing stable -Continue on Spiriva and ipratropium - COMPLETE METABOLIC PANEL WITH GFR; Future - CBC with Differential/Platelet; Future  5. Anxiety Mood stable -Continue on sertraline losartan daily #8 dyslipidemia -  TSH; Future   Family/ staff Communication: Reviewed plan of care with patient verbalized understanding  Labs/tests ordered:  - COMPLETE METABOLIC PANEL WITH GFR; Future - CBC with Differential/Platelet; Future - TSH; Future - Lipid panel; Future  Next Appointment : Return in about 6 months (around 02/16/2024) for medical mangement of chronic issues., Fasting labs in 6 months prior to visit.   Caesar Bookman, NP

## 2023-09-02 ENCOUNTER — Telehealth: Payer: Self-pay

## 2023-09-02 ENCOUNTER — Other Ambulatory Visit: Payer: Self-pay | Admitting: Adult Health

## 2023-09-02 MED ORDER — IPRATROPIUM BROMIDE HFA 17 MCG/ACT IN AERS: 2.0000 | INHALATION_SPRAY | Freq: Four times a day (QID) | RESPIRATORY_TRACT | 3 refills | Status: DC | PRN

## 2023-09-02 NOTE — Telephone Encounter (Signed)
[facility] is calling because the patient is requesting a refill on Atrovent 17 mcg which was prescribed by a historical MD and also Prednisone pack 5mg  as well.

## 2023-11-12 ENCOUNTER — Other Ambulatory Visit: Payer: Self-pay | Admitting: Adult Health

## 2023-12-02 ENCOUNTER — Other Ambulatory Visit: Payer: Self-pay | Admitting: Adult Health

## 2023-12-08 ENCOUNTER — Other Ambulatory Visit: Payer: Self-pay

## 2023-12-08 DIAGNOSIS — F419 Anxiety disorder, unspecified: Secondary | ICD-10-CM

## 2023-12-08 MED ORDER — DIAZEPAM 5 MG PO TABS
5.0000 mg | ORAL_TABLET | Freq: Two times a day (BID) | ORAL | 1 refills | Status: DC | PRN
Start: 2023-12-08 — End: 2024-04-25

## 2023-12-08 NOTE — Telephone Encounter (Signed)
 Patient called to update her number. Discuss getting her lung cancer screening and medication refill.    Patient is requesting a refill of the following medications: Requested Prescriptions   Pending Prescriptions Disp Refills   diazepam  (VALIUM ) 5 MG tablet 60 tablet 1    Sig: Take 1 tablet (5 mg total) by mouth every 12 (twelve) hours as needed. for anxiety    Date of last refill:08/15/2023  Refill amount: 60 tablets 1 refill   Treatment agreement date:

## 2023-12-12 ENCOUNTER — Ambulatory Visit: Payer: 59

## 2023-12-19 ENCOUNTER — Telehealth: Payer: Self-pay

## 2023-12-19 NOTE — Telephone Encounter (Signed)
 Patient called to cancel appointment for today. I notified patient that her appointment was 02/16/2024. Patient states that she's sick and not feeling well. I offered patient video visit for today. Patient states that this is day 1 of her not feeling well and she will call back tomorrow if she's not feeling well and schedule either video visit or office visit.

## 2024-01-04 ENCOUNTER — Other Ambulatory Visit: Payer: Self-pay | Admitting: Adult Health

## 2024-01-04 DIAGNOSIS — F320 Major depressive disorder, single episode, mild: Secondary | ICD-10-CM

## 2024-02-05 ENCOUNTER — Other Ambulatory Visit: Payer: Self-pay | Admitting: Nurse Practitioner

## 2024-02-15 ENCOUNTER — Other Ambulatory Visit: Payer: Self-pay | Admitting: Nurse Practitioner

## 2024-02-16 ENCOUNTER — Ambulatory Visit: Payer: 59 | Admitting: Adult Health

## 2024-02-16 ENCOUNTER — Encounter: Payer: Self-pay | Admitting: Adult Health

## 2024-02-16 ENCOUNTER — Ambulatory Visit (INDEPENDENT_AMBULATORY_CARE_PROVIDER_SITE_OTHER): Payer: 59 | Admitting: Adult Health

## 2024-02-16 VITALS — BP 128/88 | HR 92 | Temp 97.1°F | Resp 18 | Ht 65.0 in | Wt 203.6 lb

## 2024-02-16 DIAGNOSIS — F17200 Nicotine dependence, unspecified, uncomplicated: Secondary | ICD-10-CM

## 2024-02-16 DIAGNOSIS — Z5181 Encounter for therapeutic drug level monitoring: Secondary | ICD-10-CM

## 2024-02-16 DIAGNOSIS — E781 Pure hyperglyceridemia: Secondary | ICD-10-CM

## 2024-02-16 DIAGNOSIS — E559 Vitamin D deficiency, unspecified: Secondary | ICD-10-CM

## 2024-02-16 DIAGNOSIS — R7303 Prediabetes: Secondary | ICD-10-CM | POA: Diagnosis not present

## 2024-02-16 DIAGNOSIS — I071 Rheumatic tricuspid insufficiency: Secondary | ICD-10-CM

## 2024-02-16 DIAGNOSIS — F329 Major depressive disorder, single episode, unspecified: Secondary | ICD-10-CM

## 2024-02-16 DIAGNOSIS — J439 Emphysema, unspecified: Secondary | ICD-10-CM

## 2024-02-16 DIAGNOSIS — Z6833 Body mass index (BMI) 33.0-33.9, adult: Secondary | ICD-10-CM

## 2024-02-16 DIAGNOSIS — F419 Anxiety disorder, unspecified: Secondary | ICD-10-CM | POA: Diagnosis not present

## 2024-02-16 DIAGNOSIS — Z122 Encounter for screening for malignant neoplasm of respiratory organs: Secondary | ICD-10-CM

## 2024-02-16 DIAGNOSIS — I509 Heart failure, unspecified: Secondary | ICD-10-CM | POA: Diagnosis not present

## 2024-02-16 DIAGNOSIS — D649 Anemia, unspecified: Secondary | ICD-10-CM | POA: Diagnosis not present

## 2024-02-16 DIAGNOSIS — K219 Gastro-esophageal reflux disease without esophagitis: Secondary | ICD-10-CM | POA: Diagnosis not present

## 2024-02-16 DIAGNOSIS — E66811 Obesity, class 1: Secondary | ICD-10-CM

## 2024-02-16 DIAGNOSIS — M8580 Other specified disorders of bone density and structure, unspecified site: Secondary | ICD-10-CM

## 2024-02-16 MED ORDER — NICOTINE POLACRILEX 4 MG MT GUM: 4.0000 mg | CHEWING_GUM | OROMUCOSAL | 0 refills | Status: AC | PRN

## 2024-02-16 NOTE — Progress Notes (Signed)
 [facility] clinic  Provider:  Kenard Gower DNP  Code Status:  Full Code  Goals of Care:     07/18/2023   11:46 AM  Advanced Directives  Does Patient Have a Medical Advance Directive? No  Would patient like information on creating a medical advance directive? No - Patient declined     Chief Complaint  Patient presents with   Medical Management of Chronic Issues    6 month f/u medical management / fasting labs on same day    Discussed the use of AI scribe software for clinical note transcription with the patient, who gave verbal consent to proceed.  HPI: Patient is a 53 y.o. female seen today for a 31-month follow up of chronic medical issues.  She has experienced weight gain, increasing from 196 lbs to 203 lbs since her last visit in October, which she attributes to menopause. Her diet is irregular, often eating only once a day, and she wants to lose weight.  She experiences significant fatigue, with energy levels fluctuating daily. On active days, she feels unable to do much the following day. She also reports occasional chest pains near her heart, which she associates with scar tissue from previous heart surgery. She forgot to take her heart medication one day, resulting in feeling unwell, highlighting the importance of adherence to her medication regimen.  Her current medications include Valium 5 mg every 12 hours as needed for anxiety, Wellbutrin XL 150 mg daily for depression, and spironolactone for heart-related issues. She also takes  calcium, vitamin D, Lasix, inhalers for COPD, and iron supplements. She has a history of severe tricuspid regurgitation and underwent heart surgery in the past. She was diagnosed with congestive heart failure in 2007 and has been on spironolactone and Lasix to manage fluid around her heart.  She has COPD and early-stage emphysema, managed with Spiriva and Atrovent inhalers. She reports occasional wheezing and coughing, with phlegm that is mostly  clear but can be yellowish when sick.  She has a history of acid reflux, managed with famotidine as needed. She also has osteopenia and takes calcium and vitamin D supplements.  She has a history of smoking but has significantly reduced her intake to one cigarette lasting almost a week. She has a family history of prediabetes and anemia, with her mother and daughter also affected.  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 Her daughter has struggled with similar issues but is reportedly reducing her use.  She has been diagnosed with prediabetes but is not currently on medication for it.     Past Medical History:  Diagnosis Date   Anxiety    ARF (acute renal failure) (HCC)    a. 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   Arthritis    "knees" (05/09/2013)   Asthma    Cardiac arrest (HCC)    a. 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   Cardiac tamponade    a. s/p subxiphoid window 2007 in setting of TV endocarditis. b. 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 s/p closure in 2007.   Septic pulmonary embolism (HCC)    a. During admission for TV endocarditis.   Severe tricuspid regurgitation    a. By TEE/cath 04/2013 - for outpt referral to [facility].    Tobacco abuse     Past Surgical History:  Procedure Laterality Date   CARDIAC CATHETERIZATION     multiple/notes 06/15/2006 (05/09/2013)   DILATION AND CURETTAGE OF UTERUS     "couple of those years ago; one a couple years ago after I lost a pregnancy" (05/09/2013)   LEFT AND RIGHT HEART CATHETERIZATION WITH CORONARY ANGIOGRAM N/A 05/08/2013   Procedure: LEFT AND RIGHT HEART CATHETERIZATION WITH CORONARY ANGIOGRAM;  Surgeon: Odie Benne, MD;  Location: [facility] CATH LAB;  Service: Cardiovascular;  Laterality: N/A;   PATENT FORAMEN OVALE CLOSURE  04/22/2006   closed during tricuspid valve surgery   SUBXYPHOID PERICARDIAL WINDOW  05/07/2006   for large postoperative pericardial effusion    TEE WITHOUT CARDIOVERSION N/A 05/08/2013   Procedure: TRANSESOPHAGEAL ECHOCARDIOGRAM (TEE);  Surgeon: Lenise Quince, MD;  Location: [facility] ENDOSCOPY;  Service: Cardiovascular;  Laterality: N/A;   TRICUSPID VALVE SURGERY  04/22/2006   debridement of septal leaflet for MSSA bacterial endocarditis with closure PFO    Allergies  Allergen Reactions   Ivp Dye [Iodinated Contrast Media] Shortness Of Breath   Amoxicillin Diarrhea and Other (See Comments)    " I get very sick with this medication"   Latex Rash    " I'm allergic to latex, and it breaks by skin out in an itchy rash"   Penicillins Rash    Outpatient Encounter Medications as of 02/16/2024  Medication Sig   buPROPion (WELLBUTRIN XL) 150 MG 24 hr tablet TAKE 1 TABLET BY MOUTH DAILY   calcium carbonate (CALCIUM 600) 600 MG TABS tablet Take 1 tablet (600 mg total) by mouth 2 (two) times daily with a meal.   Cholecalciferol (VITAMIN D3) 1000 units CAPS Take 25 mg by mouth daily.   diazepam (VALIUM) 5 MG tablet Take 1 tablet (5 mg total) by mouth every 12 (twelve)  hours as needed. for anxiety   famotidine (PEPCID) 20 MG tablet TAKE 1 TABLET BY MOUTH DAILY AS  NEEDED FOR HEARTBURN   furosemide (LASIX) 20 MG tablet TAKE 1 TABLET BY MOUTH DAILY   ipratropium (ATROVENT HFA) 17 MCG/ACT inhaler Inhale 2 puffs into the lungs every 6 (six) hours as needed for wheezing.   Iron-Vitamin C 65-125 MG TABS Take by mouth.   Multiple Vitamins-Minerals (EQ COMPLETE MULTIVITAMIN-ADULT PO) Take by mouth.   nicotine polacrilex (NICORETTE) 4 MG gum Take 1 each (4 mg total) by mouth as needed for smoking cessation.   Omega-3 Fatty Acids (FISH OIL) 1000 MG CAPS Take 3 capsules by mouth daily.   rosuvastatin (CRESTOR) 20 MG tablet TAKE 1 TABLET BY MOUTH DAILY   sertraline (ZOLOFT) 100 MG tablet TAKE 1 AND 1/2 TABLETS BY MOUTH  DAILY   SPIRIVA HANDIHALER 18  MCG inhalation capsule INHALE THE CONTENTS OF 1 CAPSULE BY MOUTH VIA INHALATION DEVICE  ONCE DAILY AS DIRECTED   spironolactone (ALDACTONE) 25 MG tablet TAKE ONE-HALF TABLET BY MOUTH  DAILY   No facility-administered encounter medications on file as of 02/16/2024.    Review of Systems:  Review of Systems  Constitutional:  Negative for appetite change, chills, fatigue and fever.  HENT:  Negative for congestion, hearing loss, rhinorrhea and sore throat.   Eyes: Negative.   Respiratory:  Positive for cough and wheezing. Negative for shortness of breath.   Cardiovascular:  Negative for chest pain, palpitations and leg swelling.  Gastrointestinal:  Negative for abdominal pain, constipation, diarrhea, nausea and vomiting.  Genitourinary:  Negative for dysuria.  Musculoskeletal:  Negative for arthralgias, back pain and myalgias.  Skin:  Negative for color change, rash and wound.  Neurological:  Negative for dizziness, weakness and headaches.  Psychiatric/Behavioral:  Negative for behavioral problems. The patient is not nervous/anxious.     Health Maintenance  Topic Date Due   Zoster Vaccines- Shingrix (2 of 2) 12/17/2021    Lung Cancer Screening  09/28/2023   DTaP/Tdap/Td (2 - Td or Tdap) 01/17/2024   Cervical Cancer Screening (HPV/Pap Cotest)  01/31/2024   Medicare Annual Wellness (AWV)  02/21/2024   COVID-19 Vaccine (3 - Moderna risk series) 11/01/2028 (Originally 03/19/2020)   MAMMOGRAM  05/17/2024   INFLUENZA VACCINE  06/01/2024   Colonoscopy  07/07/2031   Pneumococcal Vaccine 71-38 Years old  Completed   Hepatitis C Screening  Completed   HIV Screening  Completed   HPV VACCINES  Aged Out   Meningococcal B Vaccine  Aged Out    Physical Exam: Vitals:   02/16/24 1049  BP: 128/88  Pulse: 92  Resp: 18  Temp: (!) 97.1 F (36.2 C)  SpO2: 97%  Weight: 203 lb 9.6 oz (92.4 kg)  Height: 5\' 5"  (1.651 m)   Body mass index is 33.88 kg/m. Physical Exam Constitutional:      General: She is not in acute distress.    Appearance: She is obese.  HENT:     Head: Normocephalic and atraumatic.     Nose: Nose normal.     Mouth/Throat:     Mouth: Mucous membranes are moist.  Eyes:     Conjunctiva/sclera: Conjunctivae normal.  Cardiovascular:     Rate and Rhythm: Normal rate and regular rhythm.  Pulmonary:     Effort: Pulmonary effort is normal.     Breath sounds: Wheezing present.  Abdominal:     General: Bowel sounds are normal.     Palpations: Abdomen is soft.  Musculoskeletal:        General: Normal range of motion.     Cervical back: Normal range of motion.  Skin:    General: Skin is warm and dry.  Neurological:     General: No focal deficit present.     Mental Status: She is alert and oriented to person, place, and time.  Psychiatric:        Mood and Affect: Mood normal.        Behavior: Behavior normal.        Thought Content: Thought content normal.        Judgment: Judgment normal.     Labs reviewed: Basic Metabolic Panel: Recent Labs    07/18/23 1431  NA 135  K 4.6  CL 102  CO2 25  GLUCOSE 83  BUN 20  CREATININE 0.89  CALCIUM 10.2   Liver  Function Tests: Recent Labs     06/08/23 1523  AST 17  ALT 18  ALKPHOS 83  BILITOT 0.4  PROT 6.3  ALBUMIN 4.0   No results for input(s): "LIPASE", "AMYLASE" in the last 8760 hours. No results for input(s): "AMMONIA" in the last 8760 hours. CBC: No results for input(s): "WBC", "NEUTROABS", "HGB", "HCT", "MCV", "PLT" in the last 8760 hours. Lipid Panel: Recent Labs    06/08/23 1523  CHOL 126  HDL 46  LDLCALC 46  TRIG 217*  CHOLHDL 2.7   Lab Results  Component Value Date   HGBA1C 5.7 01/15/2022    Procedures since last visit: No results found.  Assessment/Plan  1. Anxiety (Primary) -  Diazepam 5 mg every 12 hours as needed effectively manages anxiety and stress-related insomnia. - Continue diazepam 5 mg every 12 hours as needed. - DRUG MONITORING, PANEL 6 WITH CONFIRMATION, URINE - TSH  2. Major depression, chronic -  On Wellbutrin XL 150 mg daily. PHQ-9 score 8, indicating mild depression. Declines psychiatry referral. - Continue Wellbutrin XL 150 mg daily. - TSH  3. Severe tricuspid regurgitation -  no recent chest pain, stable -  continue 12.5 mg daily and Lasix 20 mg daily -  follows up with cardiology - Complete Metabolic Panel with eGFR  4. Gastroesophageal reflux disease without esophagitis -  Managed with famotidine as needed, effectively controlling symptoms. - Continue famotidine 20 mg as needed.  5. Anemia, unspecified type -  Mild anemia managed with iron supplements. - Continue iron supplementation. - Iron, TIBC and Ferritin Panel  6. Prediabetes Lab Results  Component Value Date   HGBA1C 5.7 01/15/2022    -  diet-controlled - TSH - Hemoglobin A1C  7. Therapeutic drug monitoring - on Valium PRN - DRUG MONITORING, PANEL 6 WITH CONFIRMATION, URINE  8. Tobacco use disorder -  Significant reduction in smoking to one cigarette per week. Nicorette gum prescribed but not received. - Resend prescription for Nicorette gum to pharmacy. - Encourage continued smoking  cessation efforts. - nicotine polacrilex (NICORETTE) 4 MG gum; Take 1 each (4 mg total) by mouth as needed for smoking cessation.  Dispense: 270 tablet; Refill: 0  9. Class 1 obesity with body mass index (BMI) of 33.0 to 33.9 in adult, unspecified obesity type, unspecified whether serious comorbidity present -  counseled on diet and exercise  10. Pulmonary emphysema, unspecified emphysema type (HCC) -  Managed with Spiriva and Atrovent inhalers. Occasional wheezing and productive cough. - Continue Spiriva and Atrovent inhalers as prescribed. - CBC with Differential/Platelets - TSH  11. Pure hypertriglyceridemia Lab Results  Component Value Date   CHOL 126 06/08/2023   HDL 46 06/08/2023   LDLCALC 46 06/08/2023   TRIG 217 (H) 06/08/2023   CHOLHDL 2.7 06/08/2023    -  continue Rosuvastatin 20 mg daily - Lipid panel - CBC with Differential/Platelets - TSH  12. Screening for lung cancer - CT CHEST LUNG CA SCREEN LOW DOSE W/O CM; Future  13. Osteopenia, unspecified location -  continue Vitamin D3 and calcium supplementation -  fall precautions - Vitamin D, 25-hydroxy  14. Vitamin D deficiency -  continue Vitamin D3 supplementation - Vitamin D, 25-hydroxy    General Health Maintenance Due for several health maintenance screenings and vaccinations. - Ensure Pap smear is scheduled. - Schedule annual wellness visit.        Labs/tests ordered:  CBC, CMP, CT chest lung  cancer screen low dose WO CM, iron studies, lipid panel, tsh,  urine drug monitoring panel, A1C, vitamin D level   Return in about 6 months (around 08/17/2024).  Baylea Milburn Medina-Vargas, NP

## 2024-02-16 NOTE — Telephone Encounter (Signed)
 Noted.

## 2024-02-17 ENCOUNTER — Ambulatory Visit
Admission: RE | Admit: 2024-02-17 | Discharge: 2024-02-17 | Disposition: A | Discharge status: Home / Self Care | Source: Ambulatory Visit | Attending: Adult Health | Admitting: Adult Health

## 2024-02-17 DIAGNOSIS — Z122 Encounter for screening for malignant neoplasm of respiratory organs: Secondary | ICD-10-CM

## 2024-02-17 DIAGNOSIS — F17210 Nicotine dependence, cigarettes, uncomplicated: Secondary | ICD-10-CM | POA: Diagnosis not present

## 2024-02-17 LAB — COMPLETE METABOLIC PANEL WITHOUT GFR
AG Ratio: 1.5 (calc) (ref 1.0–2.5)
ALT: 18 U/L (ref 6–29)
AST: 19 U/L (ref 10–35)
Albumin: 4.6 g/dL (ref 3.6–5.1)
Alkaline phosphatase (APISO): 65 U/L (ref 37–153)
BUN: 24 mg/dL (ref 7–25)
CO2: 28 mmol/L (ref 20–32)
Calcium: 10.1 mg/dL (ref 8.6–10.4)
Chloride: 104 mmol/L (ref 98–110)
Creat: 0.87 mg/dL (ref 0.50–1.03)
Globulin: 3 g/dL (ref 1.9–3.7)
Glucose, Bld: 95 mg/dL (ref 65–139)
Potassium: 4.4 mmol/L (ref 3.5–5.3)
Sodium: 139 mmol/L (ref 135–146)
Total Bilirubin: 0.8 mg/dL (ref 0.2–1.2)
Total Protein: 7.6 g/dL (ref 6.1–8.1)

## 2024-02-17 LAB — LIPID PANEL
Cholesterol: 138 mg/dL (ref <200)
HDL: 49 mg/dL — ABNORMAL LOW (ref >=50)
LDL Cholesterol (Calc): 69 mg/dL
Non-HDL Cholesterol (Calc): 89 mg/dL (ref <130)
Total CHOL/HDL Ratio: 2.8 (calc) (ref <5.0)
Triglycerides: 112 mg/dL (ref <150)

## 2024-02-17 LAB — CBC WITH DIFFERENTIAL/PLATELET
Absolute Lymphocytes: 2192 {cells}/uL (ref 850–3900)
Absolute Monocytes: 465 {cells}/uL (ref 200–950)
Basophils Absolute: 71 {cells}/uL (ref 0–200)
Basophils Relative: 0.7 %
Eosinophils Absolute: 121 {cells}/uL (ref 15–500)
Eosinophils Relative: 1.2 %
HCT: 48.7 % — ABNORMAL HIGH (ref 35.0–45.0)
Hemoglobin: 16 g/dL — ABNORMAL HIGH (ref 11.7–15.5)
MCH: 31.1 pg (ref 27.0–33.0)
MCHC: 32.9 g/dL (ref 32.0–36.0)
MCV: 94.6 fL (ref 80.0–100.0)
MPV: 10.3 fL (ref 7.5–12.5)
Monocytes Relative: 4.6 %
Neutro Abs: 7252 {cells}/uL (ref 1500–7800)
Neutrophils Relative %: 71.8 %
Platelets: 248 10*3/uL (ref 140–400)
RBC: 5.15 10*6/uL — ABNORMAL HIGH (ref 3.80–5.10)
RDW: 13 % (ref 11.0–15.0)
Total Lymphocyte: 21.7 %
WBC: 10.1 10*3/uL (ref 3.8–10.8)

## 2024-02-17 LAB — IRON,TIBC AND FERRITIN PANEL
%SAT: 31 % (ref 16–45)
Ferritin: 116 ng/mL (ref 16–232)
Iron: 99 ug/dL (ref 45–160)
TIBC: 320 ug/dL (ref 250–450)

## 2024-02-17 LAB — HEMOGLOBIN A1C
Hgb A1c MFr Bld: 5.9 % — ABNORMAL HIGH (ref <5.7)
Mean Plasma Glucose: 123 mg/dL
eAG (mmol/L): 6.8 mmol/L

## 2024-02-17 LAB — VITAMIN D 25 HYDROXY (VIT D DEFICIENCY, FRACTURES): Vit D, 25-Hydroxy: 74 ng/mL (ref 30–100)

## 2024-02-17 LAB — TSH: TSH: 1.91 m[IU]/L

## 2024-02-17 NOTE — Progress Notes (Signed)
-   cholesterol, triglycerides and LDL normal -   no 5.9, prediabetic, no anemia - A1C 5.9, ranging as prediabetes, will need to exercise at least 150 minutes/week and low carb diet -  electrolytes, vitamin D  level  and liver enzymes all normal

## 2024-02-18 ENCOUNTER — Other Ambulatory Visit: Payer: Self-pay | Admitting: Adult Health

## 2024-02-18 LAB — DRUG MONITORING, PANEL 6 WITH CONFIRMATION, URINE
6 Acetylmorphine: NEGATIVE ng/mL (ref <10)
Alcohol Metabolites: NEGATIVE ng/mL (ref <500)
Alphahydroxyalprazolam: NEGATIVE ng/mL (ref <25)
Alphahydroxymidazolam: NEGATIVE ng/mL (ref <50)
Alphahydroxytriazolam: NEGATIVE ng/mL (ref <50)
Aminoclonazepam: NEGATIVE ng/mL (ref <25)
Amphetamines: NEGATIVE ng/mL (ref <500)
Barbiturates: NEGATIVE ng/mL (ref <300)
Benzodiazepines: POSITIVE ng/mL — AB (ref <100)
Cocaine Metabolite: NEGATIVE ng/mL (ref <150)
Creatinine: 42.1 mg/dL (ref >=20.0)
Hydroxyethylflurazepam: NEGATIVE ng/mL (ref <50)
Lorazepam: NEGATIVE ng/mL (ref <50)
Marijuana Metabolite: NEGATIVE ng/mL (ref <20)
Methadone Metabolite: NEGATIVE ng/mL (ref <100)
Nordiazepam: 51 ng/mL — ABNORMAL HIGH (ref <50)
Opiates: NEGATIVE ng/mL (ref <100)
Oxazepam: 201 ng/mL — ABNORMAL HIGH (ref <50)
Oxidant: NEGATIVE ug/mL (ref <200)
Oxycodone: NEGATIVE ng/mL (ref <100)
Phencyclidine: NEGATIVE ng/mL (ref <25)
Temazepam: 143 ng/mL — ABNORMAL HIGH (ref <50)
pH: 5.4 (ref 4.5–9.0)

## 2024-02-18 LAB — DM TEMPLATE

## 2024-02-23 ENCOUNTER — Ambulatory Visit: Payer: 59 | Admitting: Adult Health

## 2024-02-23 ENCOUNTER — Encounter: Payer: Self-pay | Admitting: Adult Health

## 2024-02-23 VITALS — BP 118/80 | HR 100 | Temp 97.6°F | Resp 20 | Ht 65.0 in | Wt 204.2 lb

## 2024-02-23 DIAGNOSIS — Z0000 Encounter for general adult medical examination without abnormal findings: Secondary | ICD-10-CM

## 2024-02-23 NOTE — Progress Notes (Signed)
 Subjective:   Tina Fitzgerald is a 53 y.o. female who presents for Medicare Annual (Subsequent) preventive examination.  Visit Complete: In person  Patient Medicare AWV questionnaire was completed by the patient on 02/23/2024; I have confirmed that all information answered by patient is correct and no changes since this date.  Cardiac Risk Factors include: dyslipidemia;obesity (BMI >30kg/m2);smoking/ tobacco exposure;sedentary lifestyle     Objective:    Today's Vitals   02/23/24 1522  BP: 118/80  Pulse: 100  Resp: 20  Temp: 97.6 F (36.4 C)  SpO2: 98%  Weight: 204 lb 3.2 oz (92.6 kg)  Height: 5\' 5"  (1.651 m)   Body mass index is 33.98 kg/m.     02/16/2024   11:03 AM 07/18/2023   11:46 AM 04/11/2023    8:22 AM 06/15/2022    3:17 PM 09/24/2019    7:13 PM 07/12/2015   11:59 PM 04/21/2014   12:57 PM  Advanced Directives  Does Patient Have a Medical Advance Directive? No No No No No No Patient does not have advance directive  Would patient like information on creating a medical advance directive? No - Patient declined No - Patient declined No - Patient declined No - Patient declined No - Patient declined No - patient declined information   Pre-existing out of facility DNR order (yellow form or pink MOST form)       No    Current Medications (verified) Outpatient Encounter Medications as of 02/23/2024  Medication Sig   ATROVENT  HFA 17 MCG/ACT inhaler USE 2 INHALATIONS BY MOUTH EVERY 6 HOURS AS NEEDED FOR WHEEZING   buPROPion  (WELLBUTRIN  XL) 150 MG 24 hr tablet TAKE 1 TABLET BY MOUTH DAILY   calcium  carbonate (CALCIUM  600) 600 MG TABS tablet Take 1 tablet (600 mg total) by mouth 2 (two) times daily with a meal.   Cholecalciferol (VITAMIN D3) 1000 units CAPS Take 25 mg by mouth daily.   diazepam  (VALIUM ) 5 MG tablet Take 1 tablet (5 mg total) by mouth every 12 (twelve) hours as needed. for anxiety   famotidine  (PEPCID ) 20 MG tablet TAKE 1 TABLET BY MOUTH DAILY AS  NEEDED FOR  HEARTBURN   furosemide  (LASIX ) 20 MG tablet TAKE 1 TABLET BY MOUTH DAILY   Iron-Vitamin C  65-125 MG TABS Take by mouth.   Multiple Vitamins-Minerals (EQ COMPLETE MULTIVITAMIN-ADULT PO) Take by mouth.   nicotine  polacrilex (NICORETTE ) 4 MG gum Take 1 each (4 mg total) by mouth as needed for smoking cessation.   Omega-3 Fatty Acids  (FISH OIL ) 1000 MG CAPS Take 3 capsules by mouth daily.   rosuvastatin  (CRESTOR ) 20 MG tablet TAKE 1 TABLET BY MOUTH DAILY   sertraline  (ZOLOFT ) 100 MG tablet TAKE 1 AND 1/2 TABLETS BY MOUTH  DAILY   SPIRIVA  HANDIHALER 18 MCG inhalation capsule INHALE THE CONTENTS OF 1 CAPSULE BY MOUTH VIA INHALATION DEVICE  ONCE DAILY AS DIRECTED   spironolactone  (ALDACTONE ) 25 MG tablet TAKE ONE-HALF TABLET BY MOUTH  DAILY   No facility-administered encounter medications on file as of 02/23/2024.    Allergies (verified) Ivp dye [iodinated contrast media], Amoxicillin , Latex, and Penicillins   History: Past Medical History:  Diagnosis Date   Anxiety    ARF (acute renal failure) (HCC)    a. 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   Arthritis    "knees" (05/09/2013)   Asthma    Cardiac arrest (HCC)    a. Asystolic arrest 2007 in setting of TV endocarditis, pericardial  tamponade.   Cardiac tamponade    a. s/p subxiphoid window 2007 in setting of TV endocarditis. b. 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   GERD (gastroesophageal reflux disease)    Hepatitis B carrier  (HCC)    Hepatitis C    History of bronchitis    History of drug abuse (HCC)    History of tricuspid valve disorder 04/16/2013   Hx: UTI (urinary tract infection)    Memory loss, short term    "since OHS in 2007" (05/09/2013)   PFO (patent foramen ovale)    a. s/p closure in 2007.   Septic pulmonary embolism (HCC)    a. During admission for TV endocarditis.   Severe tricuspid regurgitation    a. By TEE/cath 04/2013 - for outpt referral to [facility].    Tobacco abuse    Past Surgical History:  Procedure Laterality Date   CARDIAC CATHETERIZATION     multiple/notes 06/15/2006 (05/09/2013)   DILATION AND CURETTAGE OF UTERUS     "couple of those years ago; one a couple years ago after I lost a pregnancy" (05/09/2013)   LEFT AND RIGHT HEART CATHETERIZATION WITH CORONARY ANGIOGRAM N/A 05/08/2013   Procedure: LEFT AND RIGHT HEART CATHETERIZATION WITH CORONARY ANGIOGRAM;  Surgeon: Odie Benne, MD;  Location: [facility] CATH LAB;  Service: Cardiovascular;  Laterality: N/A;   PATENT FORAMEN OVALE CLOSURE  04/22/2006   closed during tricuspid valve surgery   SUBXYPHOID PERICARDIAL WINDOW  05/07/2006   for large postoperative pericardial effusion    TEE WITHOUT CARDIOVERSION N/A 05/08/2013   Procedure: TRANSESOPHAGEAL ECHOCARDIOGRAM (TEE);  Surgeon: Lenise Quince, MD;  Location: [facility] ENDOSCOPY;  Service: Cardiovascular;  Laterality: N/A;   TRICUSPID VALVE SURGERY  04/22/2006   debridement of septal leaflet for MSSA bacterial endocarditis with closure PFO   Family History  Problem Relation Age of Onset   Drug abuse Mother    Depression Mother    COPD Mother    Arthritis Mother    Hyperlipidemia Mother    Alcohol abuse Father    Arthritis Father    Drug abuse Sister        at age 63   Depression Sister    Hyperlipidemia Brother    Depression Daughter    Alcohol abuse Daughter    Anxiety disorder Daughter    Drug abuse Daughter    Depression Daughter    Anxiety disorder Daughter    Drug abuse  Daughter    Depression Daughter    Anxiety disorder Daughter    Drug abuse Daughter    Bipolar disorder Daughter    Schizophrenia Daughter    Drug abuse Son    Anxiety disorder Son    Depression Son    Anxiety disorder Son    Drug abuse Son    Depression Maternal Grandmother    Heart disease Maternal Grandmother    Alcohol abuse Maternal Grandfather    Alzheimer's disease Maternal Grandfather    Arthritis Paternal Grandmother    Early death Paternal Grandfather    Alcohol abuse Paternal Actor  Ovarian cancer Cousin    Cancer Neg Hx    Hypertension Neg Hx    Kidney disease Neg Hx    Learning disabilities Neg Hx    Stroke Neg Hx    Social History   Socioeconomic History   Marital status: Divorced    Spouse name: Not on file   Number of children: 6   Years of education: Not on file   Highest education level: Some college, no degree  Occupational History   Occupation: disabled  Tobacco Use   Smoking status: Some Days    Current packs/day: 0.75    Average packs/day: 0.8 packs/day for 30.0 years (22.5 ttl pk-yrs)    Types: Cigarettes   Smokeless tobacco: Never  Vaping Use   Vaping status: Never Used  Substance and Sexual Activity   Alcohol use: Yes    Alcohol/week: 7.0 standard drinks of alcohol    Types: 7 Cans of beer per week    Comment: at times has mixed drink   Drug use: Not Currently    Types: Marijuana, Heroin, "Crack" cocaine, Cocaine    Comment: 05/09/2013 "<1 joint/day; last heroin & crack was before heart OR in 2007"   Sexual activity: Not Currently  Other Topics Concern   Not on file  Social History Narrative   Not on file   Social Drivers of Health   Financial Resource Strain: High Risk (02/13/2024)   Overall Financial Resource Strain (CARDIA)    Difficulty of Paying Living Expenses: Very hard  Food Insecurity: Food Insecurity Present (02/13/2024)   Hunger Vital Sign    Worried About Running Out of Food in the Last Year: Often true    Ran  Out of Food in the Last Year: Often true  Transportation Needs: No Transportation Needs (02/13/2024)   PRAPARE - Administrator, Civil Service (Medical): No    Lack of Transportation (Non-Medical): No  Physical Activity: Inactive (02/13/2024)   Exercise Vital Sign    Days of Exercise per Week: 0 days    Minutes of Exercise per Session: 30 min  Stress: No Stress Concern Present (02/13/2024)   [employer] of Occupational Health - Occupational Stress Questionnaire    Feeling of Stress : Only a little  Social Connections: Moderately Isolated (02/13/2024)   Social Connection and Isolation Panel [NHANES]    Frequency of Communication with Friends and Family: Twice a week    Frequency of Social Gatherings with Friends and Family: Once a week    Attends Religious Services: More than 4 times per year    Active Member of [employer] or Organizations: No    Attends Engineer, structural: Not on file    Marital Status: Divorced    Tobacco Counseling Ready to quit: Not Answered Counseling given: Not Answered   Clinical Intake:  Pre-visit preparation completed: Yes  Pain : No/denies pain     BMI - recorded: 33.88 Nutritional Status: BMI > 30  Obese Nutritional Risks: None Diabetes: No (A1C 5.9 , prediabetes)  How often do you need to have someone help you when you read instructions, pamphlets, or other written materials from your doctor or pharmacy?: 1 - Never What is the last grade level you completed in school?: 3rd year college, psychology  Interpreter Needed?: No  Information entered by :: Marija Calamari Medina-Vargas NP   Activities of Daily Living    02/22/2024    7:57 PM  In your present state of health, do you have any difficulty performing  the following activities:  Hearing? 0  Vision? 0  Difficulty concentrating or making decisions? 1  Walking or climbing stairs? 0  Dressing or bathing? 0  Doing errands, shopping? 0  Preparing Food and eating ? N  Using  the Toilet? N  In the past six months, have you accidently leaked urine? Y  Do you have problems with loss of bowel control? Y  Managing your Medications? N  Managing your Finances? N  Housekeeping or managing your Housekeeping? Y    Patient Care Team: Medina-Vargas, Sid Dragon, NP as PCP - General (Internal Medicine) Elmyra Haggard, MD as PCP - Cardiology (Cardiology) Odie Benne, MD as Attending Physician (Cardiology) Gardenia Jump, MD (Inactive) as Attending Physician (Cardiothoracic Surgery)  Indicate any recent Medical Services you may have received from other than [facility] providers in the past year (date may be approximate).     Assessment:   This is a routine wellness examination for Tina Fitzgerald.  Hearing/Vision screen No results found.   Goals Addressed             This Visit's Progress    Exercise 3x per week (30 min per time)       - leg lifts and ROM of extremities       Depression Screen    02/23/2024    3:32 PM 02/16/2024   11:04 AM 07/18/2023    1:15 PM 04/11/2023   10:13 AM 04/11/2023   10:10 AM 06/15/2022    3:18 PM 06/15/2022    3:16 PM  PHQ 2/9 Scores  PHQ - 2 Score 3 0 0 0 0 0 0  PHQ- 9 Score 10 8 0 0       Fall Risk    02/23/2024    3:31 PM 02/22/2024    7:57 PM 02/16/2024   11:04 AM 07/18/2023    1:16 PM 04/11/2023   10:10 AM  Fall Risk   Falls in the past year? 0 0 0 0 0  Number falls in past yr: 0 0 0 0 0  Injury with Fall? 0 0 0 0 0  Risk for fall due to : No Fall Risks  No Fall Risks No Fall Risks No Fall Risks  Follow up Falls evaluation completed  Falls evaluation completed Falls evaluation completed Falls evaluation completed    MEDICARE RISK AT HOME: Medicare Risk at Home Any stairs in or around the home?: (Patient-Rptd) Yes If so, are there any without handrails?: (Patient-Rptd) Yes Home free of loose throw rugs in walkways, pet beds, electrical cords, etc?: (Patient-Rptd) Yes Adequate lighting in your home to reduce risk of  falls?: (Patient-Rptd) Yes Life alert?: (Patient-Rptd) No Use of a cane, walker or w/c?: (Patient-Rptd) No Grab bars in the bathroom?: (Patient-Rptd) No Shower chair or bench in shower?: (Patient-Rptd) Yes Elevated toilet seat or a handicapped toilet?: (Patient-Rptd) No  TIMED UP AND GO:  Was the test performed?  Yes  Length of time to ambulate 10 feet: <10 sec sec Gait steady and fast without use of assistive device    Cognitive Function:        02/23/2024    3:35 PM 06/15/2022    3:19 PM  6CIT Screen  What Year? 0 points 0 points  What month? 0 points 0 points  What time? 0 points 0 points  Count back from 20 0 points 0 points  Months in reverse 0 points 0 points  Repeat phrase 2 points 0 points  Total Score  2 points 0 points    Immunizations Immunization History  Administered Date(s) Administered   Influenza Split 08/01/2013   Influenza, Seasonal, Injecte, Preservative Fre 07/20/2019   Influenza,inj,Quad PF,6+ Mos 08/22/2014, 10/22/2021, 09/17/2022   Influenza-Unspecified 07/18/2023   Moderna Sars-Covid-2 Vaccination 01/23/2020, 02/20/2020   PNEUMOCOCCAL CONJUGATE-20 06/01/2022   Pneumococcal Polysaccharide-23 04/17/2014   Tdap 01/16/2014, 09/16/2023   Zoster Recombinant(Shingrix) 10/22/2021, 10/11/2023    TDAP status: Up to date  Flu Vaccine status: Up to date  Pneumococcal vaccine status: Up to date  Covid-19 vaccine status: Information provided on how to obtain vaccines.   Qualifies for Shingles Vaccine? Yes   Zostavax completed Yes   Shingrix Completed?: Yes  Screening Tests Health Maintenance  Topic Date Due   Lung Cancer Screening  09/28/2023   Cervical Cancer Screening (HPV/Pap Cotest)  01/31/2024   COVID-19 Vaccine (3 - Moderna risk series) 11/01/2028 (Originally 03/19/2020)   MAMMOGRAM  05/17/2024   INFLUENZA VACCINE  06/01/2024   Medicare Annual Wellness (AWV)  02/22/2025   Colonoscopy  07/07/2031   DTaP/Tdap/Td (3 - Td or Tdap) 09/15/2033    Pneumococcal Vaccine 62-83 Years old  Completed   Hepatitis C Screening  Completed   HIV Screening  Completed   Zoster Vaccines- Shingrix  Completed   HPV VACCINES  Aged Out   Meningococcal B Vaccine  Aged Out    Health Maintenance  Health Maintenance Due  Topic Date Due   Lung Cancer Screening  09/28/2023   Cervical Cancer Screening (HPV/Pap Cotest)  01/31/2024    Colorectal cancer screening: Type of screening: Colonoscopy. Completed 2023. Repeat every 5 years  Mammogram status: Completed 05/19/2023. Repeat every year  Bone Density status: Completed 10/12/22. Results reflect: Bone density results: OSTEOPENIA. Repeat every 2 years.  Lung Cancer Screening: (Low Dose CT Chest recommended if Age 10-80 years, 20 pack-year currently smoking OR have quit w/in 15years.) does qualify.   Lung Cancer Screening Referral: No  Additional Screening:  Hepatitis C Screening: does qualify; Completed 08/23/22  Vision Screening: Recommended annual ophthalmology exams for early detection of glaucoma and other disorders of the eye. Is the patient up to date with their annual eye exam?  Yes  Who is the provider or what is the name of the office in which the patient attends annual eye exams? [facility] If pt is not established with a provider, would they like to be referred to a provider to establish care? No .   Dental Screening: Recommended annual dental exams for proper oral hygiene  Diabetic Foot Exam: Diabetic Foot Exam: Overdue, Pt has been advised about the importance in completing this exam. Pt is scheduled for diabetic foot exam on next visit.  Community Resource Referral / Chronic Care Management: CRR required this visit?  No   CCM required this visit?  No     Plan:     I have personally reviewed and noted the following in the patient's chart:   Medical and social history Use of alcohol, tobacco or illicit drugs  Current medications and supplements including  opioid prescriptions. Patient is not currently taking opioid prescriptions. Functional ability and status Nutritional status Physical activity Advanced directives List of other physicians Hospitalizations, surgeries, and ER visits in previous 12 months Vitals Screenings to include cognitive, depression, and falls Referrals and appointments  In addition, I have reviewed and discussed with patient certain preventive protocols, quality metrics, and best practice recommendations. A written personalized care plan for preventive services as well as general preventive  health recommendations were provided to patient.     Lanee Chain Medina-Vargas, NP   02/23/2024   After Visit Summary: (In Person-Printed) AVS printed and given to the patient  Nurse Notes:  Needs to be done annually.

## 2024-02-23 NOTE — Patient Instructions (Addendum)
  Ms. Luebke , Thank you for taking time to come for your Medicare Wellness Visit. I appreciate your ongoing commitment to your health goals. Please review the following plan we discussed and let me know if I can assist you in the future.   These are the goals we discussed:  Goals      Exercise 3x per week (30 min per time)     - leg lifts and ROM of extremities     Quit smoking / using tobacco        This is a list of the screening recommended for you and due dates:  Health Maintenance  Topic Date Due   Screening for Lung Cancer  09/28/2023   Pap with HPV screening  01/31/2024   COVID-19 Vaccine (3 - Moderna risk series) 11/01/2028*   Mammogram  05/17/2024   Flu Shot  06/01/2024   Medicare Annual Wellness Visit  02/22/2025   Colon Cancer Screening  07/07/2031   DTaP/Tdap/Td vaccine (3 - Td or Tdap) 09/15/2033   Pneumococcal Vaccination  Completed   Hepatitis C Screening  Completed   HIV Screening  Completed   Zoster (Shingles) Vaccine  Completed   HPV Vaccine  Aged Out   Meningitis B Vaccine  Aged Out  *Topic was postponed. The date shown is not the original due date.

## 2024-02-24 NOTE — Telephone Encounter (Signed)
 Valium  is considered a benzodiazepine medication

## 2024-02-24 NOTE — Telephone Encounter (Signed)
 You take Valium  as needed and whatever you take will show in your urine. It is expected to have positive benzo in your urine since you take it. It is abnormal to have no benzo in your urine since we prescribe it to you. This confirms you are really taking it.

## 2024-03-17 ENCOUNTER — Ambulatory Visit: Payer: Self-pay | Admitting: Adult Health

## 2024-03-17 NOTE — Progress Notes (Signed)
-   CT chest lung negative for cancer

## 2024-04-24 ENCOUNTER — Other Ambulatory Visit: Payer: Self-pay | Admitting: Adult Health

## 2024-04-24 DIAGNOSIS — F419 Anxiety disorder, unspecified: Secondary | ICD-10-CM

## 2024-04-25 ENCOUNTER — Other Ambulatory Visit: Payer: Self-pay | Admitting: Internal Medicine

## 2024-04-25 NOTE — Telephone Encounter (Signed)
 Patient is requesting refill for controlled substance, patient has contracted signed from April needs rx refilled.

## 2024-05-02 ENCOUNTER — Ambulatory Visit: Payer: Self-pay

## 2024-05-02 NOTE — Telephone Encounter (Signed)
 Called and spoke with patient. Scheduled a Virtual appointment with Greig Cluster, NP for 05/03/2024

## 2024-05-02 NOTE — Telephone Encounter (Signed)
 Unable to reach patient after 3 attempts by E2C2 NT, routing to the provider for resolution per protocol.

## 2024-05-02 NOTE — Telephone Encounter (Signed)
 Patient called, left VM to return the call to the office to speak to NT.

## 2024-05-02 NOTE — Telephone Encounter (Signed)
 Copied from CRM [id number]. Topic: Clinical - Medical Advice >> May 02, 2024  1:30 PM Alfonso ORN wrote: Reason for CRM: patient's daughter and Sister and daughter and elderly mom live together , Patient and daughter  share rooms but separate beds , daughter  was on patient's bed her 53 year old daughter woke up this morning with symptoms of scabies . elderly mom having symptoms and have swelling face and refusing treatment  patient do not have any symptoms need advice , want to know if something patient can or use to  prevent getting the scabies  patient #  [phone]

## 2024-05-02 NOTE — Telephone Encounter (Signed)
 Attempt X1 to call pt regarding original call concerns.  No response noted.  Left discrete VM appropriately.

## 2024-05-03 ENCOUNTER — Encounter: Payer: Self-pay | Admitting: Orthopedic Surgery

## 2024-05-03 ENCOUNTER — Other Ambulatory Visit: Payer: Self-pay | Admitting: Adult Health

## 2024-05-03 ENCOUNTER — Ambulatory Visit: Admitting: Orthopedic Surgery

## 2024-05-03 ENCOUNTER — Telehealth (INDEPENDENT_AMBULATORY_CARE_PROVIDER_SITE_OTHER): Admitting: Orthopedic Surgery

## 2024-05-03 DIAGNOSIS — Z207 Contact with and (suspected) exposure to pediculosis, acariasis and other infestations: Secondary | ICD-10-CM

## 2024-05-03 MED ORDER — PERMETHRIN 5 % EX CREA
1.0000 | TOPICAL_CREAM | Freq: Once | CUTANEOUS | Status: DC
Start: 2024-05-03 — End: 2024-05-07

## 2024-05-03 NOTE — Progress Notes (Signed)
 This service is provided via telemedicine  No vital signs collected/recorded due to the encounter was a telemedicine visit.   Location of patient (ex: home, work):  home  Patient consents to a telephone visit:  yes   Location of the provider (ex: office, home):  office  Name of any referring provider:  None  Names of all persons participating in the telemedicine service and their role in the encounter:  Gil, Amy NP Evertt Pereyra CMA  Time spent on call:  12 mins

## 2024-05-03 NOTE — Telephone Encounter (Signed)
 Copied from CRM [id number]. Topic: Clinical - Prescription Issue >> May 03, 2024  5:34 PM Mercer PEDLAR wrote: Reason for CRM: Patient calling regarding prescription for permethrin (ELIMITE) 5 % cream which was prescribed during her virtual visit today 05/03/24. She stated that she contacted her pharmacy and they do not have the prescription, and I do not see a pharmacy where it was sent listed under the prescription.   Patient would like prescription sent to  [facility] [address] ([address]), [place] - [address] [place] ([place]) [place] [address] Phone: [phone] Fax: [phone]

## 2024-05-03 NOTE — Progress Notes (Signed)
 Careteam: Patient Care Team: Medina-Vargas, Jereld BROCKS, NP as PCP - General (Internal Medicine) Okey Vina GAILS, MD as PCP - Cardiology (Cardiology) Verlin Lonni BIRCH, MD as Attending Physician (Cardiology) Dusty Sudie DEL, MD (Inactive) as Attending Physician (Cardiothoracic Surgery)  Seen by: Greig Cluster, AGNP-C  PLACE OF SERVICE:  [facility]  Advanced Directive information    Allergies  Allergen Reactions   Ivp Dye [Iodinated Contrast Media] Shortness Of Breath   Amoxicillin  Diarrhea and Other (See Comments)     I get very sick with this medication   Latex Rash     I'm allergic to latex, and it breaks by skin out in an itchy rash   Penicillins Rash    Chief Complaint  Patient presents with   Video Visit    Patient wants to discuss possible scabies , pt  daughter has been diagnosed with it.      HPI: Patient is a 53 y.o. female seen today for acute visit via video visit due to scabies exposure.   Discussed the use of AI scribe software for clinical note transcription with the patient, who gave verbal consent to proceed.  History of Present Illness    A family member in her household was diagnosed with scabies approximately one week ago. Initially, the family member did not adhere to the treatment directions properly, necessitating a second application of the prescribed ointment, which was applied correctly yesterday.  She shares a room with exposed person. She is concerned she will get it as well. No symptoms at this time.    Review of Systems:  Review of Systems  Constitutional:  Negative for chills and fever.  Respiratory:  Negative for shortness of breath.   Cardiovascular:  Negative for chest pain.  Skin:  Negative for itching and rash.  Psychiatric/Behavioral:  Negative for depression. The patient is not nervous/anxious.     Past Medical History:  Diagnosis Date   Anxiety    ARF (acute renal failure) (HCC)    a. During admission for TV  endocarditis - req dialysis in 2007 temporarily.   Arthritis    knees (05/09/2013)   Asthma    Cardiac arrest (HCC)    a. 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   Cardiac tamponade    a. s/p subxiphoid window 2007 in setting of TV endocarditis. b. 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 s/p closure in 2007.   Septic pulmonary embolism (HCC)    a. During admission for TV endocarditis.   Severe tricuspid regurgitation    a. By TEE/cath 04/2013 - for outpt referral to [facility].  Tobacco abuse    Past Surgical History:  Procedure Laterality Date   CARDIAC CATHETERIZATION     multiple/notes 06/15/2006 (05/09/2013)   DILATION AND CURETTAGE OF UTERUS     couple of those years ago; one a couple years ago after I lost a pregnancy (05/09/2013)   LEFT AND RIGHT HEART CATHETERIZATION WITH CORONARY ANGIOGRAM N/A 05/08/2013   Procedure: LEFT AND RIGHT HEART CATHETERIZATION WITH CORONARY ANGIOGRAM;  Surgeon: Lonni JONETTA Cash, MD;  Location: [facility] CATH LAB;  Service: Cardiovascular;  Laterality: N/A;   PATENT FORAMEN OVALE CLOSURE  04/22/2006   closed during tricuspid  valve surgery   SUBXYPHOID PERICARDIAL WINDOW  05/07/2006   for large postoperative pericardial effusion    TEE WITHOUT CARDIOVERSION N/A 05/08/2013   Procedure: TRANSESOPHAGEAL ECHOCARDIOGRAM (TEE);  Surgeon: Redell GORMAN Shallow, MD;  Location: [facility] ENDOSCOPY;  Service: Cardiovascular;  Laterality: N/A;   TRICUSPID VALVE SURGERY  04/22/2006   debridement of septal leaflet for MSSA bacterial endocarditis with closure PFO   Social History:   reports that she has been smoking cigarettes. She has a 22.5 pack-year smoking history. She has never used smokeless tobacco. She reports that she does not currently use alcohol after a past usage of about 7.0 standard drinks of alcohol per week. She reports that she does not currently use drugs after having used the following drugs: Marijuana, Heroin, Crack cocaine, and Cocaine.  Family History  Problem Relation Age of Onset   Drug abuse Mother    Depression Mother    COPD Mother    Arthritis Mother    Hyperlipidemia Mother    Alcohol abuse Father    Arthritis Father    Drug abuse Sister        at age 79   Depression Sister    Hyperlipidemia Brother    Depression Daughter    Alcohol abuse Daughter    Anxiety disorder Daughter    Drug abuse Daughter    Depression Daughter    Anxiety disorder Daughter    Drug abuse Daughter    Depression Daughter    Anxiety disorder Daughter    Drug abuse Daughter    Bipolar disorder Daughter    Schizophrenia Daughter    Drug abuse Son    Anxiety disorder Son    Depression Son    Anxiety disorder Son    Drug abuse Son    Depression Maternal Grandmother    Heart disease Maternal Grandmother    Alcohol abuse Maternal Grandfather    Alzheimer's disease Maternal Grandfather    Arthritis Paternal Grandmother    Early death Paternal Grandfather    Alcohol abuse Paternal Grandfather    Ovarian cancer Cousin    Cancer Neg Hx    Hypertension Neg Hx    Kidney disease Neg Hx    Learning disabilities Neg Hx     Stroke Neg Hx     Medications: Patient's Medications  New Prescriptions   No medications on file  Previous Medications   ATROVENT  HFA 17 MCG/ACT INHALER    USE 2 INHALATIONS BY MOUTH EVERY 6 HOURS AS NEEDED FOR WHEEZING   BUPROPION  (WELLBUTRIN  XL) 150 MG 24 HR TABLET    TAKE 1 TABLET BY MOUTH DAILY   CALCIUM  CARBONATE (CALCIUM  600) 600 MG TABS TABLET    Take 1 tablet (600 mg total) by mouth 2 (two) times daily with a meal.   CHOLECALCIFEROL (VITAMIN D3) 1000 UNITS CAPS    Take 25 mg by mouth daily.   DIAZEPAM  (  VALIUM ) 5 MG TABLET    TAKE 1 TABLET BY MOUTH EVERY 12  HOURS AS NEEDED FOR ANXIETY   FAMOTIDINE  (PEPCID ) 20 MG TABLET    TAKE 1 TABLET BY MOUTH DAILY AS  NEEDED FOR HEARTBURN   FUROSEMIDE  (LASIX ) 20 MG TABLET    TAKE 1 TABLET BY MOUTH DAILY   IRON-VITAMIN C  65-125 MG TABS    Take by mouth.   MULTIPLE VITAMINS-MINERALS (EQ COMPLETE MULTIVITAMIN-ADULT PO)    Take by mouth.   NICOTINE  POLACRILEX (NICORETTE ) 4 MG GUM    Take 1 each (4 mg total) by mouth as needed for smoking cessation.   OMEGA-3 FATTY ACIDS  (FISH OIL ) 1000 MG CAPS    Take 3 capsules by mouth daily.   ROSUVASTATIN  (CRESTOR ) 20 MG TABLET    TAKE 1 TABLET BY MOUTH DAILY   SERTRALINE  (ZOLOFT ) 100 MG TABLET    TAKE 1 AND 1/2 TABLETS BY MOUTH  DAILY   SPIRIVA  HANDIHALER 18 MCG INHALATION CAPSULE    INHALE THE CONTENTS OF 1 CAPSULE BY MOUTH VIA INHALATION DEVICE  ONCE DAILY AS DIRECTED   SPIRONOLACTONE  (ALDACTONE ) 25 MG TABLET    TAKE ONE-HALF TABLET BY MOUTH  DAILY  Modified Medications   No medications on file  Discontinued Medications   No medications on file    Physical Exam:  There were no vitals filed for this visit. There is no height or weight on file to calculate BMI. Wt Readings from Last 3 Encounters:  02/23/24 204 lb 3.2 oz (92.6 kg)  02/16/24 203 lb 9.6 oz (92.4 kg)  08/18/23 196 lb 9.6 oz (89.2 kg)    Physical Exam Vitals reviewed: limited exam due to video visit.  Constitutional:      General: She  is not in acute distress. Neurological:     Mental Status: She is alert.     Labs reviewed: Basic Metabolic Panel: Recent Labs    07/18/23 1431 02/16/24 1137  NA 135 139  K 4.6 4.4  CL 102 104  CO2 25 28  GLUCOSE 83 95  BUN 20 24  CREATININE 0.89 0.87  CALCIUM  10.2 10.1  TSH  --  1.91   Liver Function Tests: Recent Labs    06/08/23 1523 02/16/24 1137  AST 17 19  ALT 18 18  ALKPHOS 83  --   BILITOT 0.4 0.8  PROT 6.3 7.6  ALBUMIN 4.0  --    No results for input(s): LIPASE, AMYLASE in the last 8760 hours. No results for input(s): AMMONIA in the last 8760 hours. CBC: Recent Labs    02/16/24 1137  WBC 10.1  NEUTROABS 7,252  HGB 16.0*  HCT 48.7*  MCV 94.6  PLT 248   Lipid Panel: Recent Labs    06/08/23 1523 02/16/24 1137  CHOL 126 138  HDL 46 49*  LDLCALC 46 69  TRIG 217* 112  CHOLHDL 2.7 2.8   TSH: Recent Labs    02/16/24 1137  TSH 1.91   A1C: Lab Results  Component Value Date   HGBA1C 5.9 (H) 02/16/2024     Assessment/Plan 1. Exposure to scabies (Primary) - daughter has had scabies x 1 week - no rash, mites or itching at this time - discussed washing linens and clothing in hot water - may also contain contaminated items in sealed trash bags x 1 week - may use permethrin if she develops scabies - permethrin (ELIMITE) 5 % cream; Apply 1 Application topically once for 1 dose.   Total time:  15 minutes. Greater than 50% of total time spent doing patient education regarding scabies including symptom/medication management.    Video visit  I connected with Madelin Fridge by virtual visit and verified that I am speaking with the correct person using two identifiers.  Location:[facility] Patient: Ieasha Boerema Provider:Monterius Rolf FORBES Cluster, NP    I discussed the limitations, risks, security and privacy concerns of performing an evaluation and management service by telephone and the availability of in person appointments. I also  discussed with the patient that there may be a patient responsible charge related to this service. The patient expressed understanding and agreed to proceed.   I discussed the assessment and treatment plan with the patient. The patient was provided an opportunity to ask questions and all were answered. The patient agreed with the plan and demonstrated an understanding of the instructions.   The patient was advised to call back or seek an in-person evaluation if the symptoms worsen or if the condition fails to improve as anticipated.  I provided 10 minutes of face-to-face time during this encounter.  Donnette Macmullen FORBES Cluster, NP  Avs printed and mailed   Next appt: Visit date not found  Carollynn Pennywell Cluster BODILY  [facility] [phone]

## 2024-05-07 MED ORDER — PERMETHRIN 5 % EX CREA: 1.0000 | TOPICAL_CREAM | Freq: Once | CUTANEOUS | 0 refills | Status: AC

## 2024-05-12 ENCOUNTER — Other Ambulatory Visit: Payer: Self-pay | Admitting: Adult Health

## 2024-05-21 ENCOUNTER — Other Ambulatory Visit: Payer: Self-pay | Admitting: Adult Health

## 2024-05-21 ENCOUNTER — Other Ambulatory Visit: Payer: Self-pay | Admitting: Internal Medicine

## 2024-05-21 DIAGNOSIS — J449 Chronic obstructive pulmonary disease, unspecified: Secondary | ICD-10-CM

## 2024-05-21 DIAGNOSIS — F419 Anxiety disorder, unspecified: Secondary | ICD-10-CM

## 2024-05-21 DIAGNOSIS — G8929 Other chronic pain: Secondary | ICD-10-CM

## 2024-05-21 NOTE — Telephone Encounter (Signed)
 Patient has request for diazepam  refill on 05/21/2024. Patient last refill was 04/25/24. Patient has contract on file dated 02/22/24. Patient has upcoming appointment 08/23/2024. Updated contract/Sign contract was added to appointment notes. Medication pend and sent to PCP (Medina-Vargas, Monina C, NP) for approval.

## 2024-06-22 ENCOUNTER — Other Ambulatory Visit: Payer: Self-pay | Admitting: Obstetrics & Gynecology

## 2024-06-22 DIAGNOSIS — Z1231 Encounter for screening mammogram for malignant neoplasm of breast: Secondary | ICD-10-CM

## 2024-07-19 ENCOUNTER — Ambulatory Visit

## 2024-07-20 ENCOUNTER — Ambulatory Visit

## 2024-07-25 ENCOUNTER — Other Ambulatory Visit: Payer: Self-pay | Admitting: Adult Health

## 2024-07-25 ENCOUNTER — Other Ambulatory Visit: Payer: Self-pay | Admitting: Nurse Practitioner

## 2024-07-25 DIAGNOSIS — F419 Anxiety disorder, unspecified: Secondary | ICD-10-CM

## 2024-07-25 MED ORDER — DIAZEPAM 5 MG PO TABS: 5.0000 mg | ORAL_TABLET | Freq: Two times a day (BID) | ORAL | 0 refills | Status: DC | PRN

## 2024-07-25 NOTE — Telephone Encounter (Signed)
 Patient has request for refill on 07/25/24. Patient last refill was 05/22/24. Patient has contract on file dated 02/16/24. Patient has upcoming appointment 08/23/24. Medication pend and sent to PCP (Medina-Vargas, Monina C, NP) for approval.

## 2024-07-25 NOTE — Telephone Encounter (Signed)
 Copied from CRM #[id number]. Topic: Clinical - Medication Refill >> Jul 25, 2024 11:44 AM Susanna ORN wrote: Medication: diazepam  (VALIUM ) 5 MG tablet  Has the patient contacted their pharmacy? Yes (Agent: If no, request that the patient contact the pharmacy for the refill. If patient does not wish to contact the pharmacy document the reason why and proceed with request.) (Agent: If yes, when and what did the pharmacy advise?)  This is the patient's preferred pharmacy:   [facility] - [place], [place] - [address] [place]  [address] Phone: [phone] Fax: [phone]  Is this the correct pharmacy for this prescription? Yes If no, delete pharmacy and type the correct one.   Has the prescription been filled recently? Yes  Is the patient out of the medication? Yes  Has the patient been seen for an appointment in the last year OR does the patient have an upcoming appointment? Yes  Can we respond through MyChart? Yes  Agent: Please be advised that Rx refills may take up to 3 business days. We ask that you follow-up with your pharmacy.

## 2024-07-25 NOTE — Telephone Encounter (Signed)
This is a duplicated encounter

## 2024-07-25 NOTE — Telephone Encounter (Signed)
 Copied from CRM [id number]. Topic: Clinical - Prescription Issue >> Jul 25, 2024 11:50 AM Susanna ORN wrote: Reason for CRM: Patient states she didn't realize that she was out of medication due to having a long weekend and she forgot. She's in need of diazepam  (VALIUM ) 5 MG tablet. Had a very difficult night without it and she contacted [employer] and they told her that they would provide her with an emergency rush at no cost to her but it's contingent upon her provider refilling the prescription. Wants to know what she could do to see if provider would respond to them by today? Patient states it's extremely urgent as she had a difficult time last night.

## 2024-07-25 NOTE — Telephone Encounter (Signed)
 Patient is requesting a refill of the following medications: Requested Prescriptions    No prescriptions requested or ordered in this encounter    Date of last refill: 05/22/24  Refill amount: 60  Treatment agreement date: No treatment agreement on file, notation made on pending appointment 08/23/24

## 2024-07-31 ENCOUNTER — Ambulatory Visit
Admission: RE | Admit: 2024-07-31 | Discharge: 2024-07-31 | Disposition: A | Discharge status: Home / Self Care | Source: Ambulatory Visit | Attending: Obstetrics & Gynecology | Admitting: Obstetrics & Gynecology

## 2024-07-31 DIAGNOSIS — Z1231 Encounter for screening mammogram for malignant neoplasm of breast: Secondary | ICD-10-CM | POA: Diagnosis not present

## 2024-08-03 ENCOUNTER — Other Ambulatory Visit: Payer: Self-pay | Admitting: Medical Genetics

## 2024-08-05 ENCOUNTER — Other Ambulatory Visit: Payer: Self-pay | Admitting: Nurse Practitioner

## 2024-08-05 ENCOUNTER — Other Ambulatory Visit: Payer: Self-pay | Admitting: Adult Health

## 2024-08-20 LAB — HM PAP SMEAR: HPV, high-risk: POSITIVE

## 2024-08-23 ENCOUNTER — Other Ambulatory Visit

## 2024-08-23 ENCOUNTER — Ambulatory Visit: Admitting: Adult Health

## 2024-08-23 ENCOUNTER — Encounter: Payer: Self-pay | Admitting: Adult Health

## 2024-08-23 VITALS — BP 124/82 | HR 82 | Temp 97.2°F | Resp 18 | Ht 65.0 in | Wt 196.6 lb

## 2024-08-23 DIAGNOSIS — K219 Gastro-esophageal reflux disease without esophagitis: Secondary | ICD-10-CM

## 2024-08-23 DIAGNOSIS — F419 Anxiety disorder, unspecified: Secondary | ICD-10-CM

## 2024-08-23 DIAGNOSIS — Z23 Encounter for immunization: Secondary | ICD-10-CM | POA: Diagnosis not present

## 2024-08-23 DIAGNOSIS — F321 Major depressive disorder, single episode, moderate: Secondary | ICD-10-CM | POA: Diagnosis not present

## 2024-08-23 DIAGNOSIS — I071 Rheumatic tricuspid insufficiency: Secondary | ICD-10-CM | POA: Diagnosis not present

## 2024-08-23 DIAGNOSIS — E785 Hyperlipidemia, unspecified: Secondary | ICD-10-CM

## 2024-08-23 DIAGNOSIS — M8580 Other specified disorders of bone density and structure, unspecified site: Secondary | ICD-10-CM

## 2024-08-23 DIAGNOSIS — Z720 Tobacco use: Secondary | ICD-10-CM

## 2024-08-23 DIAGNOSIS — F1911 Other psychoactive substance abuse, in remission: Secondary | ICD-10-CM | POA: Insufficient documentation

## 2024-08-23 DIAGNOSIS — J439 Emphysema, unspecified: Secondary | ICD-10-CM

## 2024-08-23 MED ORDER — DIAZEPAM 5 MG PO TABS: 5.0000 mg | ORAL_TABLET | Freq: Two times a day (BID) | ORAL | 0 refills | Status: DC | PRN

## 2024-08-23 NOTE — Patient Instructions (Signed)
   Needs to fast for next visit

## 2024-08-23 NOTE — Progress Notes (Signed)
 [facility]  Provider:  Jereld Serum DNP  Code Status:  Full Code  Goals of Care:     02/16/2024   11:03 AM  Advanced Directives  Does Patient Have a Medical Advance Directive? No  Would patient like information on creating a medical advance directive? No - Patient declined     Chief Complaint  Patient presents with   Medical Management of Chronic Issues    6 months    Discussed the use of AI scribe software for clinical note transcription with the patient, who gave verbal consent to proceed.  HPI: Patient is a 53 y.o. female seen today for a 60-month follow up of chronic medical issues.  She is currently on sertraline  150 mg daily and Wellbutrin  XL 150 mg daily for anxiety and depression. She uses diazepam  5 mg every 12 hours as needed for anxiety and is doing well with this regimen. Occasional stress related to caring for her mother can exacerbate her anxiety.  She has COPD and uses Spiriva  18 mcg inhalation daily and Atrovent  HFA two inhalations every six hours as needed. She currently smokes very little, stating that one cigarette lasts her about a week. She has a history of wheezing, and reports that her most recent CT scan for her lungs did not show any nodules. She also has aortic atherosclerosis.  She has severe tricuspid regurgitation and takes Lasix  20 mg daily and spironolactone  12.5 mg daily. She notes increased urination after taking these medications.  She was recently diagnosed as HPV positive and is concerned about the implications, especially given her family history of cervical cancer. She reports no symptoms such as genital warts or abnormal bleeding.  Her acid reflux has improved significantly with dietary changes, and she takes famotidine  20 mg as needed, approximately once a week.  She has hypertriglyceridemia and takes rosuvastatin  20 mg daily. Her cholesterol levels were normal as of February 16, 2024.  She has osteopenia and takes vitamin D  and  calcium , though she only takes one calcium  tablet daily due to cost constraints.  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    Past Medical History:  Diagnosis Date   Anxiety    ARF (acute renal failure)    a. 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   Arthritis    knees (05/09/2013)   Asthma    Cardiac arrest (HCC)    a. 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   Cardiac tamponade    a. s/p subxiphoid window 2007 in setting of TV endocarditis. b. 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 s/p closure in 2007.   Septic pulmonary embolism (HCC)    a. During admission for TV endocarditis.   Severe tricuspid regurgitation    a. By TEE/cath 04/2013 - for outpt referral to [facility].    Tobacco abuse     Past Surgical History:  Procedure Laterality Date   CARDIAC CATHETERIZATION     multiple/notes 06/15/2006 (05/09/2013)   DILATION AND CURETTAGE OF UTERUS     couple of those years ago; one a couple years ago after I lost a pregnancy (05/09/2013)   LEFT  AND RIGHT HEART CATHETERIZATION WITH CORONARY ANGIOGRAM N/A 05/08/2013   Procedure: LEFT AND RIGHT HEART CATHETERIZATION WITH CORONARY ANGIOGRAM;  Surgeon: Lonni JONETTA Cash, MD;  Location: [facility] CATH LAB;  Service: Cardiovascular;  Laterality: N/A;   PATENT FORAMEN OVALE CLOSURE  04/22/2006   closed during tricuspid valve surgery   SUBXYPHOID PERICARDIAL WINDOW  05/07/2006   for large postoperative pericardial effusion    TEE WITHOUT CARDIOVERSION N/A 05/08/2013   Procedure: TRANSESOPHAGEAL ECHOCARDIOGRAM (TEE);  Surgeon: Redell GORMAN Shallow, MD;  Location: [facility] ENDOSCOPY;  Service: Cardiovascular;  Laterality: N/A;   TRICUSPID VALVE SURGERY  04/22/2006   debridement of septal leaflet for MSSA bacterial endocarditis with closure PFO    Allergies  Allergen Reactions   Ivp Dye [Iodinated Contrast Media] Shortness Of Breath   Amoxicillin  Diarrhea and Other (See Comments)     I get very sick with this medication   Latex Rash     I'm allergic to latex, and it breaks by skin out in an itchy rash   Penicillins Rash    Outpatient Encounter Medications as of 08/23/2024  Medication Sig   ATROVENT  HFA 17 MCG/ACT inhaler USE 2 INHALATIONS BY MOUTH EVERY 6 HOURS AS NEEDED FOR WHEEZING   buPROPion  (WELLBUTRIN  XL) 150 MG 24 hr tablet TAKE 1 TABLET BY MOUTH DAILY   calcium  carbonate (CALCIUM  600) 600 MG TABS tablet Take 1 tablet (600 mg total) by mouth 2 (two) times daily with a meal.   Cholecalciferol (VITAMIN D3) 1000 units CAPS Take 25 mg by mouth daily.   famotidine  (PEPCID ) 20 MG tablet TAKE 1 TABLET BY MOUTH DAILY AS  NEEDED FOR HEARTBURN   furosemide  (LASIX ) 20 MG tablet TAKE 1 TABLET BY MOUTH DAILY   Iron-Vitamin C  65-125 MG TABS Take by mouth.   Multiple Vitamins-Minerals (EQ COMPLETE MULTIVITAMIN-ADULT PO) Take by mouth.   nicotine  polacrilex (NICORETTE ) 4 MG gum Take 1 each (4 mg total) by mouth as needed for smoking cessation.   Omega-3 Fatty Acids  (FISH OIL ) 1000 MG CAPS Take 3 capsules by  mouth daily.   rosuvastatin  (CRESTOR ) 20 MG tablet TAKE 1 TABLET BY MOUTH DAILY   sertraline  (ZOLOFT ) 100 MG tablet TAKE 1 AND 1/2 TABLETS BY MOUTH  DAILY   SPIRIVA  HANDIHALER 18 MCG inhalation capsule INHALE THE CONTENTS OF 1 CAPSULE BY MOUTH VIA INHALATION DEVICE  ONCE DAILY AS DIRECTED   spironolactone  (ALDACTONE ) 25 MG tablet TAKE ONE-HALF TABLET BY MOUTH  DAILY   [DISCONTINUED] diazepam  (VALIUM ) 5 MG tablet Take 1 tablet (5 mg total) by mouth every 12 (twelve) hours as needed. for anxiety   diazepam  (VALIUM ) 5 MG tablet Take 1 tablet (5 mg total) by mouth every 12 (twelve) hours as needed. for anxiety   No facility-administered encounter medications on file as of 08/23/2024.    Review of Systems:  Review of Systems  Constitutional:  Negative for appetite change, chills, fatigue and fever.  HENT:  Negative for congestion, hearing loss, rhinorrhea and sore throat.   Eyes: Negative.   Respiratory:  Negative for cough, shortness of breath and wheezing.   Cardiovascular:  Negative for chest pain, palpitations and leg swelling.  Gastrointestinal:  Negative for abdominal pain, constipation, diarrhea, nausea and vomiting.  Genitourinary:  Negative for dysuria.  Musculoskeletal:  Negative for arthralgias, back pain and myalgias.  Skin:  Negative for color change, rash and wound.  Neurological:  Negative for dizziness, weakness and headaches.  Psychiatric/Behavioral:  Negative for behavioral problems. The patient is not nervous/anxious.     Health Maintenance  Topic Date Due   Hepatitis B Vaccines 19-59 Average Risk (1 of 3 - 19+ 3-dose series) 01/26/1990   COVID-19 Vaccine (3 - Moderna risk series) 11/01/2028 (Originally 03/19/2020)   Lung Cancer Screening  02/16/2025   Medicare Annual Wellness (AWV)  02/22/2025   Mammogram  07/31/2025   Cervical Cancer Screening (HPV/Pap Cotest)  08/20/2029   Colonoscopy  07/07/2031   DTaP/Tdap/Td (3 - Td or Tdap) 09/15/2033   Pneumococcal Vaccine:  50+ Years  Completed   Influenza Vaccine  Completed   Hepatitis C Screening  Completed   HIV Screening  Completed   Zoster Vaccines- Shingrix  Completed   HPV VACCINES  Aged Out   Meningococcal B Vaccine  Aged Out    Physical Exam: Vitals:   08/23/24 1549  BP: 124/82  Pulse: 82  Resp: 18  Temp: (!) 97.2 F (36.2 C)  SpO2: 96%  Weight: 196 lb 9.6 oz (89.2 kg)  Height: 5' 5 (1.651 m)   Body mass index is 32.72 kg/m. Physical Exam Constitutional:      General: She is not in acute distress.    Appearance: She is obese.  HENT:     Head: Normocephalic and atraumatic.     Nose: Nose normal.     Mouth/Throat:     Mouth: Mucous membranes are moist.  Eyes:     Conjunctiva/sclera: Conjunctivae normal.  Cardiovascular:     Rate and Rhythm: Normal rate and regular rhythm.  Pulmonary:     Effort: Pulmonary effort is normal.     Breath sounds: Normal breath sounds.  Abdominal:     General: Bowel sounds are normal.     Palpations: Abdomen is soft.  Musculoskeletal:        General: Normal range of motion.     Cervical back: Normal range of motion.  Skin:    General: Skin is warm and dry.  Neurological:     General: No focal deficit present.     Mental Status: She is alert and oriented to person, place, and time.  Psychiatric:        Mood and Affect: Mood normal.        Behavior: Behavior normal.        Thought Content: Thought content normal.        Judgment: Judgment normal.     Labs reviewed: Basic Metabolic Panel: Recent Labs    02/16/24 1137  NA 139  K 4.4  CL 104  CO2 28  GLUCOSE 95  BUN 24  CREATININE 0.87  CALCIUM  10.1  TSH 1.91   Liver Function Tests: Recent Labs    02/16/24 1137  AST 19  ALT 18  BILITOT 0.8  PROT 7.6   No results for input(s): LIPASE, AMYLASE in the last 8760 hours. No results for input(s): AMMONIA in the last  8760 hours. CBC: Recent Labs    02/16/24 1137  WBC 10.1  NEUTROABS 7,252  HGB 16.0*  HCT 48.7*  MCV  94.6  PLT 248   Lipid Panel: Recent Labs    02/16/24 1137  CHOL 138  HDL 49*  LDLCALC 69  TRIG 887  CHOLHDL 2.8   Lab Results  Component Value Date   HGBA1C 5.9 (H) 02/16/2024    Procedures since last visit: MM 3D SCREENING MAMMOGRAM BILATERAL BREAST Result Date: 08/03/2024 CLINICAL DATA:  Screening. EXAM: DIGITAL SCREENING BILATERAL MAMMOGRAM WITH TOMOSYNTHESIS AND CAD TECHNIQUE: Bilateral screening digital craniocaudal and mediolateral oblique mammograms were obtained. Bilateral screening digital breast tomosynthesis was performed. The images were evaluated with computer-aided detection. COMPARISON:  Previous exam(s). ACR Breast Density Category b: There are scattered areas of fibroglandular density. FINDINGS: There are no findings suspicious for malignancy. IMPRESSION: No mammographic evidence of malignancy. A result letter of this screening mammogram will be mailed directly to the patient. RECOMMENDATION: Screening mammogram in one year. (Code:SM-B-01Y) BI-RADS CATEGORY  1: Negative. Electronically Signed   By: Corean Salter M.D.   On: 08/03/2024 08:21    Assessment/Plan  1. Chronic obstructive pulmonary disease with emphysema, unspecified emphysema type (HCC) -  well-managed with Spiriva  and Atrovent . Smoking reduced to one cigarette per week. CT scan shows emphysema, no nodules, normal lung runs, aortic atherosclerosis noted. - Continue Spiriva  18 mcg inhalation daily. - Continue Atrovent  HFA, 2 inhalations every 6 hours as needed. - Repeat CT scan in 12 months. - Increase intake of green leafy vegetables for aortic atherosclerosis.  2. Current moderate episode of major depressive disorder, unspecified whether recurrent (HCC) -  Well-managed with sertraline , Wellbutrin , and diazepam . Reports stable mood and good sleep. - Continue sertraline  150 mg daily. - Continue Wellbutrin  XL 150 mg daily.  3. Severe tricuspid regurgitation -  Managed with diuretics. Reports  increased urination from Lasix  and spironolactone . - Continue Lasix  20 mg daily. - Continue spironolactone  12.5 mg daily.  4. Gastroesophageal reflux disease without esophagitis -  Symptoms well-controlled with dietary modifications and famotidine . Reports significant improvement. - Continue famotidine  20 mg as needed. - Encourage dietary modifications to avoid triggers.  5. Dyslipidemia -  Well-controlled with rosuvastatin . Recent lipid panel normal. - Continue rosuvastatin  20 mg daily.  6. Anxiety -  - Continue diazepam  5 mg every 12 hours as needed. - diazepam  (VALIUM ) 5 MG tablet; Take 1 tablet (5 mg total) by mouth every 12 (twelve) hours as needed. for anxiety  Dispense: 60 tablet; Refill: 0  7. Tobacco use -  Significant reduction to one cigarette per week. -  counseled on cessation  8. Immunization due (Primary) - Flu vaccine trivalent PF, 6mos and older(Flulaval,Afluria,Fluarix,Fluzone)  9. Osteopenia -  Managed with vitamin D  and calcium  supplementation. Reduced calcium  intake due to cost. - Continue vitamin D  supplementation. - Encourage adequate calcium  intake.    Labs/tests ordered:  None   Return in about 6 months (around 02/21/2025).  Maureen Duesing Medina-Vargas, NP

## 2024-09-05 ENCOUNTER — Other Ambulatory Visit: Payer: Self-pay | Admitting: Adult Health

## 2024-09-05 ENCOUNTER — Other Ambulatory Visit: Payer: Self-pay | Admitting: Nurse Practitioner

## 2024-09-05 ENCOUNTER — Other Ambulatory Visit: Payer: Self-pay | Admitting: Internal Medicine

## 2024-09-05 DIAGNOSIS — F419 Anxiety disorder, unspecified: Secondary | ICD-10-CM

## 2024-09-05 MED ORDER — DIAZEPAM 5 MG PO TABS: 5.0000 mg | ORAL_TABLET | Freq: Two times a day (BID) | ORAL | 0 refills | Status: DC | PRN

## 2024-09-05 NOTE — Telephone Encounter (Signed)
 Medication was already signed last week. Medication needs to be refused.

## 2024-09-18 ENCOUNTER — Other Ambulatory Visit: Payer: Self-pay | Admitting: Adult Health

## 2024-09-18 DIAGNOSIS — F419 Anxiety disorder, unspecified: Secondary | ICD-10-CM

## 2024-09-18 NOTE — Telephone Encounter (Signed)
 A rx is in the computer dated 09/23/24 (date not approached yet) (sent to [facility])

## 2024-09-23 ENCOUNTER — Other Ambulatory Visit: Payer: Self-pay | Admitting: Adult Health

## 2024-10-03 ENCOUNTER — Other Ambulatory Visit: Payer: Self-pay | Admitting: Internal Medicine

## 2024-10-12 ENCOUNTER — Ambulatory Visit: Payer: Self-pay

## 2024-10-12 NOTE — Telephone Encounter (Signed)
 FYI Only or Action Required?: FYI only for provider: appointment scheduled on 10/15/24.  Patient was last seen in primary care on 08/23/2024 by Medina-Vargas, Jereld BROCKS, NP.  Called Nurse Triage reporting [place].  Symptoms began several days ago.  Interventions attempted: Nothing.  Symptoms are: unchanged.  Triage Disposition: See PCP When Office is Open (Within 3 Days)  Patient/caregiver understands and will follow disposition?: Yes   Copied from CRM #[id number]. Topic: Clinical - Red Word Triage >> Oct 12, 2024  4:08 PM Chiquita SQUIBB wrote: Red Word that prompted transfer to Nurse Triage: Patient is calling in with a lump under her ear lobe that appeared a couple days ago and it is painful to touch. Reason for Disposition  [1] Small swelling or lump AND [2] unexplained AND [3] present > 1 week  Answer Assessment - Initial Assessment Questions 1. APPEARANCE of SWELLING: What does it look like?     Lump to L jawline/ neck   2. SIZE: How large is the swelling? (e.g., inches, cm; or compare to size of pinhead, tip of pen, eraser, coin, pea, grape, ping pong ball)      Marble size  3. LOCATION: Where is the swelling located?     Jawline on left side   4. ONSET: When did the swelling start?     X1 week   5. COLOR: What color is it? Is there more than one color?     Flesh toned   6. PAIN: Is there any pain? If Yes, ask: How bad is the pain? (Scale 1-10; or mild, moderate, severe)       Only when pressure applied; asymptomatic   7. ITCH: Does it itch? If Yes, ask: How bad is the itch?      None   8. CAUSE: What do you think caused the swelling?     Unknown; hx of boil   9 OTHER SYMPTOMS: Do you have any other symptoms? (e.g., fever)     None  Protocols used: Skin Lump or Localized Swelling-A-AH

## 2024-10-15 ENCOUNTER — Telehealth: Payer: Self-pay

## 2024-10-15 ENCOUNTER — Ambulatory Visit: Admitting: Adult Health

## 2024-10-15 ENCOUNTER — Encounter: Payer: Self-pay | Admitting: Adult Health

## 2024-10-15 VITALS — BP 120/86 | HR 83 | Temp 97.4°F | Ht 65.0 in | Wt 198.0 lb

## 2024-10-15 DIAGNOSIS — K1120 Sialoadenitis, unspecified: Secondary | ICD-10-CM

## 2024-10-15 DIAGNOSIS — F419 Anxiety disorder, unspecified: Secondary | ICD-10-CM

## 2024-10-15 MED ORDER — DIAZEPAM 5 MG PO TABS: 5.0000 mg | ORAL_TABLET | Freq: Two times a day (BID) | ORAL | 0 refills | Status: DC | PRN

## 2024-10-15 MED ORDER — SULFAMETHOXAZOLE-TRIMETHOPRIM 800-160 MG PO TABS: 1.0000 | ORAL_TABLET | Freq: Two times a day (BID) | ORAL | 0 refills | Status: AC

## 2024-10-15 NOTE — Progress Notes (Signed)
 [facility] clinic  Provider:  Jereld Serum DNP  Code Status:  Full Code  Goals of Care:     02/16/2024   11:03 AM  Advanced Directives  Does Patient Have a Medical Advance Directive? No  Would patient like information on creating a medical advance directive? No - Patient declined     Chief Complaint  Patient presents with   Mass    Patient reports lump left side of neck/jaw line.     Discussed the use of AI scribe software for clinical note transcription with the patient, who gave verbal consent to proceed.  HPI: Patient is a 53 y.o. female seen today for an acute visit for left neck mass.  Two days ago, she developed a sore throat, followed by the appearance of a lump under her left jaw, initially described as feeling like a 'beam'. The lump has since increased in size and causes pain only when pressed.  She denies fever but notes her eyes feel warm, which she associates with the onset of a fever. No difficulty swallowing is reported. She experiences slight pain in her left ear, which began today and is described as 'very, very minor'.  She has been managing her symptoms with alternating doses of Tylenol  and ibuprofen , although she missed her scheduled dose today, which she believes might be contributing to her discomfort.  She is currently taking diazepam  for anxiety, with a regimen of one tablet during the day and two at night. Current contract states Diazepam  5 mg BID PRN only.  Socially, she has been permanently disabled since 2005 and has not worked since then. She is experiencing stress due to her mother's Alzheimer's disease and the process of finding a new place to live.       Past Medical History:  Diagnosis Date   Anxiety    ARF (acute renal failure)    a. 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   Arthritis    knees (05/09/2013)   Asthma    Cardiac arrest (HCC)    a. 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   Cardiac tamponade    a. s/p subxiphoid window 2007 in setting of TV endocarditis. b. 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 s/p closure in 2007.   Septic pulmonary embolism (HCC)    a. During admission for TV endocarditis.   Severe tricuspid regurgitation    a. By TEE/cath 04/2013 - for outpt referral to [facility].    Tobacco abuse     Past Surgical History:  Procedure Laterality Date   CARDIAC CATHETERIZATION  multiple/notes 06/15/2006 (05/09/2013)   DILATION AND CURETTAGE OF UTERUS     couple of those years ago; one a couple years ago after I lost a pregnancy (05/09/2013)   LEFT AND RIGHT HEART CATHETERIZATION WITH CORONARY ANGIOGRAM N/A 05/08/2013   Procedure: LEFT AND RIGHT HEART CATHETERIZATION WITH CORONARY ANGIOGRAM;  Surgeon: Lonni JONETTA Cash, MD;  Location: [facility] CATH LAB;  Service: Cardiovascular;  Laterality: N/A;   PATENT FORAMEN OVALE CLOSURE  04/22/2006   closed during tricuspid valve surgery   SUBXYPHOID PERICARDIAL WINDOW  05/07/2006   for large postoperative pericardial effusion    TEE WITHOUT CARDIOVERSION N/A 05/08/2013   Procedure: TRANSESOPHAGEAL  ECHOCARDIOGRAM (TEE);  Surgeon: Redell GORMAN Shallow, MD;  Location: [facility] ENDOSCOPY;  Service: Cardiovascular;  Laterality: N/A;   TRICUSPID VALVE SURGERY  04/22/2006   debridement of septal leaflet for MSSA bacterial endocarditis with closure PFO    Allergies[1]  Outpatient Encounter Medications as of 10/15/2024  Medication Sig   ATROVENT  HFA 17 MCG/ACT inhaler USE 2 INHALATIONS BY MOUTH EVERY 6 HOURS AS NEEDED FOR WHEEZING   buPROPion  (WELLBUTRIN  XL) 150 MG 24 hr tablet TAKE 1 TABLET BY MOUTH DAILY   calcium  carbonate (CALCIUM  600) 600 MG TABS tablet Take 1 tablet (600 mg total) by mouth 2 (two) times daily with a meal.   Cholecalciferol (VITAMIN D3) 1000 units CAPS Take 25 mg by mouth daily.   famotidine  (PEPCID ) 20 MG tablet TAKE 1 TABLET BY MOUTH DAILY AS  NEEDED FOR HEARTBURN   furosemide  (LASIX ) 20 MG tablet Take 1 tablet (20 mg total) by mouth daily. Please schedule overdue yearly appt for future refills [phone]. Thank you 1st attempt   Iron-Vitamin C  65-125 MG TABS Take by mouth.   mirabegron  ER (MYRBETRIQ ) 50 MG TB24 tablet Take 50 mg by mouth daily.   Multiple Vitamins-Minerals (EQ COMPLETE MULTIVITAMIN-ADULT PO) Take by mouth.   nicotine  polacrilex (NICORETTE ) 4 MG gum Take 1 each (4 mg total) by mouth as needed for smoking cessation.   Omega-3 Fatty Acids  (FISH OIL ) 1000 MG CAPS Take 3 capsules by mouth daily.   rosuvastatin  (CRESTOR ) 20 MG tablet TAKE 1 TABLET BY MOUTH DAILY   sertraline  (ZOLOFT ) 100 MG tablet TAKE 1 AND 1/2 TABLETS BY MOUTH  DAILY   SPIRIVA  HANDIHALER 18 MCG inhalation capsule INHALE THE CONTENTS OF 1 CAPSULE BY MOUTH VIA INHALATION DEVICE  ONCE DAILY AS DIRECTED   spironolactone  (ALDACTONE ) 25 MG tablet TAKE ONE-HALF TABLET BY MOUTH  DAILY   sulfamethoxazole -trimethoprim  (BACTRIM  DS) 800-160 MG tablet Take 1 tablet by mouth 2 (two) times daily for 10 days.   [DISCONTINUED] diazepam  (VALIUM ) 5 MG tablet Take 1 tablet (5 mg total) by mouth every 12 (twelve) hours  as needed. for anxiety   [START ON 10/23/2024] diazepam  (VALIUM ) 5 MG tablet Take 1 tablet (5 mg total) by mouth every 12 (twelve) hours as needed. for anxiety   No facility-administered encounter medications on file as of 10/15/2024.    Review of Systems:  Review of Systems  Constitutional:  Positive for appetite change. Negative for chills, fatigue and fever.  HENT:  Positive for ear pain and sore throat. Negative for congestion, hearing loss and rhinorrhea.        Left ear pain  Eyes: Negative.   Respiratory:  Negative for cough, shortness of breath and wheezing.   Cardiovascular:  Negative for chest pain, palpitations and leg swelling.  Gastrointestinal:  Negative for abdominal pain, constipation, diarrhea, nausea and vomiting.  Genitourinary:  Negative  for dysuria.  Musculoskeletal:  Negative for arthralgias, back pain and myalgias.  Skin:  Negative for color change, rash and wound.  Neurological:  Negative for dizziness, weakness and headaches.  Psychiatric/Behavioral:  Negative for behavioral problems. The patient is not nervous/anxious.     Health Maintenance  Topic Date Due   Hepatitis B Vaccines 19-59 Average Risk (1 of 3 - 19+ 3-dose series) 01/26/1990   COVID-19 Vaccine (3 - Moderna risk series) 11/01/2028 (Originally 03/19/2020)   Lung Cancer Screening  02/16/2025   Medicare Annual Wellness (AWV)  02/22/2025   Mammogram  07/31/2025   Cervical Cancer Screening (HPV/Pap Cotest)  08/20/2029   Colonoscopy  07/07/2031   DTaP/Tdap/Td (3 - Td or Tdap) 09/15/2033   Pneumococcal Vaccine: 50+ Years  Completed   Influenza Vaccine  Completed   Hepatitis C Screening  Completed   HIV Screening  Completed   Zoster Vaccines- Shingrix  Completed   HPV VACCINES  Aged Out   Meningococcal B Vaccine  Aged Out    Physical Exam: Vitals:   10/15/24 1535  BP: 120/86  Pulse: 83  Temp: (!) 97.4 F (36.3 C)  SpO2: 96%  Weight: 198 lb (89.8 kg)  Height: 5' 5 (1.651 m)   Body mass  index is 32.95 kg/m. Physical Exam Constitutional:      General: She is not in acute distress.    Appearance: She is obese.  HENT:     Head: Normocephalic and atraumatic.     Nose: Nose normal.     Mouth/Throat:     Mouth: Mucous membranes are moist.  Eyes:     Conjunctiva/sclera: Conjunctivae normal.  Neck:     Comments: -  enlarged lymph node on left neck, below the jaw, tender  Cardiovascular:     Rate and Rhythm: Normal rate and regular rhythm.  Pulmonary:     Effort: Pulmonary effort is normal.     Breath sounds: Normal breath sounds.  Abdominal:     General: Bowel sounds are normal.     Palpations: Abdomen is soft.  Musculoskeletal:        General: Normal range of motion.     Cervical back: Normal range of motion.  Skin:    General: Skin is warm and dry.  Neurological:     General: No focal deficit present.     Mental Status: She is alert and oriented to person, place, and time.  Psychiatric:        Mood and Affect: Mood normal.        Behavior: Behavior normal.        Thought Content: Thought content normal.        Judgment: Judgment normal.     Labs reviewed: Basic Metabolic Panel: Recent Labs    02/16/24 1137  NA 139  K 4.4  CL 104  CO2 28  GLUCOSE 95  BUN 24  CREATININE 0.87  CALCIUM  10.1  TSH 1.91   Liver Function Tests: Recent Labs    02/16/24 1137  AST 19  ALT 18  BILITOT 0.8  PROT 7.6   No results for input(s): LIPASE, AMYLASE in the last 8760 hours. No results for input(s): AMMONIA in the last 8760 hours. CBC: Recent Labs    02/16/24 1137  WBC 10.1  NEUTROABS 7,252  HGB 16.0*  HCT 48.7*  MCV 94.6  PLT 248   Lipid Panel: Recent Labs    02/16/24 1137  CHOL 138  HDL 49*  LDLCALC 69  TRIG  112  CHOLHDL 2.8   Lab Results  Component Value Date   HGBA1C 5.9 (H) 02/16/2024    Procedures since last visit: No results found.  Assessment/Plan  1. Infection of parotid gland (Primary) -  Left parotid gland  infection likely viral progressing to bacterial. No fever, but eyes warm. No swallowing difficulty. Allergic to penicillin and amoxicillin . - Prescribed Bactrim  DS every 12 hours for 10 days. - Advised warm salt water gargles. - Recommended warm compresses to affected area. - Instructed to increase fluid intake. - Advised follow-up if symptoms persist beyond two weeks. - sulfamethoxazole -trimethoprim  (BACTRIM  DS) 800-160 MG tablet; Take 1 tablet by mouth 2 (two) times daily for 10 days.  Dispense: 20 tablet; Refill: 0  2. Anxiety -  Stressors include caregiving and housing instability. - Refilled diazepam  prescription with 60 tablets for 30 days. - diazepam  (VALIUM ) 5 MG tablet; Take 1 tablet (5 mg total) by mouth every 12 (twelve) hours as needed. for anxiety  Dispense: 60 tablet; Refill: 0    Labs/tests ordered:   None   Return if symptoms worsen or fail to improve.  Treasure Ochs Medina-Vargas, NP    [1]  Allergies Allergen Reactions   Ivp Dye [Iodinated Contrast Media] Shortness Of Breath   Amoxicillin  Diarrhea and Other (See Comments)     I get very sick with this medication   Latex Rash     I'm allergic to latex, and it breaks by skin out in an itchy rash   Penicillins Rash

## 2024-10-15 NOTE — Telephone Encounter (Signed)
Patient will discuss at appointment today.

## 2024-10-15 NOTE — Telephone Encounter (Signed)
 She has an appointment today and can discuss concerns during visit.

## 2024-10-15 NOTE — Telephone Encounter (Signed)
 Copied from CRM #[id number]. Topic: Clinical - Prescription Issue >> Oct 15, 2024  9:32 AM Graeme ORN wrote: Reason for CRM: Patient called- states she has appt but wanted to reach out before appt - she [facility]. [facility] but it is not correct. She also need to check on status of Diazepam . Let her know I show it was sent last 11/23. Patient will discuss with provider. Thank You

## 2024-10-16 ENCOUNTER — Other Ambulatory Visit: Payer: Self-pay | Admitting: Adult Health

## 2024-10-16 ENCOUNTER — Telehealth: Payer: Self-pay

## 2024-10-16 DIAGNOSIS — F419 Anxiety disorder, unspecified: Secondary | ICD-10-CM

## 2024-10-16 MED ORDER — DIAZEPAM 5 MG PO TABS: 5.0000 mg | ORAL_TABLET | Freq: Two times a day (BID) | ORAL | 0 refills | Status: DC | PRN

## 2024-10-16 NOTE — Telephone Encounter (Signed)
 eRx for Diazepam  sent to [facility] at [facility].

## 2024-10-16 NOTE — Telephone Encounter (Signed)
 Copied from CRM #[id number]. Topic: Clinical - Prescription Issue >> Oct 16, 2024  1:30 PM Farrel B wrote: Reason for CRM: [id number] Sangiv diazepam  (VALIUM ) 5 MG tablet was sent to the  [facility] - [place] - [address] AT [facility] pharmacist states that they cannot fill this prescription due to the location of the pharmacy it has to be either a DO or MD which sends the prescription into the pharmacy. Please call and advise

## 2024-10-16 NOTE — Telephone Encounter (Signed)
 Can you pls call them what is wrong with the prescription?  Thanks.   Tina Fitzgerald

## 2024-10-19 ENCOUNTER — Ambulatory Visit: Payer: Self-pay

## 2024-10-19 ENCOUNTER — Other Ambulatory Visit: Payer: Self-pay | Admitting: Adult Health

## 2024-10-19 DIAGNOSIS — K1120 Sialoadenitis, unspecified: Secondary | ICD-10-CM

## 2024-10-19 MED ORDER — METRONIDAZOLE 500 MG PO TABS: 500.0000 mg | ORAL_TABLET | Freq: Three times a day (TID) | ORAL | 0 refills | Status: AC

## 2024-10-19 NOTE — Telephone Encounter (Signed)
 Patient has been scheduled for 10/23/2024 at 11:20. Advised to go to urgent care or ER if symptoms worsen. Patient agreed.

## 2024-10-19 NOTE — Telephone Encounter (Signed)
 I agree that if she doesn't feel better , then she needs to be seen on Monday, 07/23/24, in the clinic.

## 2024-10-19 NOTE — Telephone Encounter (Signed)
 Please advise FYI: Patient has only been on Bactrim  for 4 of the 10 day supply.

## 2024-10-19 NOTE — Telephone Encounter (Signed)
 FYI Only or Action Required?: Action required by provider: clinical question for provider and update on patient condition.  Patient was last seen in primary care on 10/15/2024 by Medina-Vargas, Jereld BROCKS, NP.  Called Nurse Triage reporting Advice Only.  Symptoms began today.  Interventions attempted: Prescription medications: bactrim .  Symptoms are: gradually worsening.  Triage Disposition: Call PCP When Office is Open  Patient/caregiver understands and will follow disposition?: Yes   Copied from CRM #[id number]. Topic: Clinical - Red Word Triage >> Oct 19, 2024 12:50 PM Mercer PEDLAR wrote: Red Word that prompted transfer to Nurse Triage: parotid gland infection which patient was seen for on 10/15/24 is getting worse. She stated that she was prescribed antibiotics and started to feel better after starting the meds but this morning she noticed that it is swollen and painful again.   Reason for Disposition  [1] Caller requesting NON-URGENT health information AND [2] PCP's office is the best resource  Answer Assessment - Initial Assessment Questions 1. REASON FOR CALL: What is the main reason for your call? or How can I best help you?     Pt called stating that she is taking her Bactrim  as directed from appt 12/15 but doesn't feel like infection is getting better. States that area has hardened and painful. Discussed tx regimen per office note:  1. Infection of parotid gland (Primary) -  Left parotid gland infection likely viral progressing to bacterial. No fever, but eyes warm. No swallowing difficulty. Allergic to penicillin and amoxicillin . - Prescribed Bactrim  DS every 12 hours for 10 days. - Advised warm salt water gargles. - Recommended warm compresses to affected area. - Instructed to increase fluid intake. - Advised follow-up if symptoms persist beyond two weeks. - sulfamethoxazole -trimethoprim  (BACTRIM  DS) 800-160 MG tablet; Take 1 tablet by mouth 2 (two) times daily for 10 days.   Dispense: 20 tablet; Refill: 0  Pt states she had forgot about other tx regimens and has only been taking bactrim . Pt states she would like to try heat and salt water gargles then will f/u with clinic Monday if symptoms not improved. Reassured her I would notify PCP of call and update. Pt voiced appreciation and understanding of tx regimen at this time.  Protocols used: Information Only Call - No Triage-A-AH

## 2024-10-23 ENCOUNTER — Ambulatory Visit: Admitting: Adult Health

## 2024-11-04 ENCOUNTER — Other Ambulatory Visit: Payer: Self-pay | Admitting: Adult Health

## 2024-11-04 DIAGNOSIS — F320 Major depressive disorder, single episode, mild: Secondary | ICD-10-CM

## 2024-11-09 ENCOUNTER — Ambulatory Visit: Payer: Self-pay

## 2024-11-09 NOTE — Telephone Encounter (Signed)
" °  Reason for Disposition  Health information question, no triage required and triager able to answer question  Answer Assessment - Initial Assessment Questions 1. REASON FOR CALL: What is the main reason for your call? or How can I best help you?  Pt asking if she can get the flu if she has had the flu shot and then had the flu 6 days ago. Another person in her home has the flu and is asking if she can get it again.   Advised that it can vary depending on variance/strain. Reviewed preventative measures including distance, masking, hand hygiene.  Protocols used: Information Only Call - No Triage-A-AH Copied from CRM [id number]. Topic: Clinical - Medical Advice >> Nov 09, 2024  2:03 PM Chiquita SQUIBB wrote: Reason for CRM: Patient is calling in stating she had a flu shot earlier this year and then caught the flu and it has spread throughout her house. Patient got over is recently but now another person has it and she is asking if she can get the flu again. Please advise patient "

## 2024-11-14 ENCOUNTER — Other Ambulatory Visit: Payer: Self-pay | Admitting: Adult Health

## 2024-11-14 ENCOUNTER — Other Ambulatory Visit: Payer: Self-pay | Admitting: Internal Medicine

## 2024-11-14 DIAGNOSIS — F419 Anxiety disorder, unspecified: Secondary | ICD-10-CM

## 2024-11-14 MED ORDER — DIAZEPAM 5 MG PO TABS: 5.0000 mg | ORAL_TABLET | Freq: Two times a day (BID) | ORAL | 0 refills | Status: AC | PRN

## 2024-11-14 NOTE — Telephone Encounter (Signed)
 Patient has request for refill on 11/15/23. Patient last refill was 10/23/24. Patient has contract on file dated 02/16/24. Patient has upcoming appointment 02/26/25. Medication pend and sent to PCP (Medina-Vargas, Monina C, NP) for approval.

## 2024-11-14 NOTE — Telephone Encounter (Signed)
 Sent eRx for diazepam  to start 11/23/24.

## 2024-11-14 NOTE — Telephone Encounter (Signed)
 Please see note on hold until 11/23/2024

## 2024-11-14 NOTE — Telephone Encounter (Signed)
 Closing encounter. This is a duplicate being addressed in another encounter

## 2024-11-14 NOTE — Telephone Encounter (Signed)
 Pt of Dr. Okey. Pt has had a CMP in 2025 but not the BMP with EGFR since 2024. Please address.

## 2024-11-14 NOTE — Telephone Encounter (Signed)
 Copied from CRM #[id number]. Topic: Clinical - Medication Refill >> Nov 14, 2024  8:33 AM Miquel SAILOR wrote: Medication: diazepam  (VALIUM ) 5 MG tablet   Has the patient contacted their pharmacy? Yes (Agent: If no, request that the patient contact the pharmacy for the refill. If patient does not wish to contact the pharmacy document the reason why and proceed with request.) (Agent: If yes, when and what did the pharmacy advise?)  This is the patient's preferred pharmacy:    [facility] [address] ([address]), [place] - [address] [place] ([place]) [place] [address] Phone: [phone] Fax: [phone]  Is this the correct pharmacy for this prescription? Yes If no, delete pharmacy and type the correct one.   Has the prescription been filled recently? Yes  Is the patient out of the medication? No still has 2 pills Needs this ASAP she can not sleep   Has the patient been seen for an appointment in the last year OR does the patient have an upcoming appointment? Yes  Can we respond through MyChart? Yes  Agent: Please be advised that Rx refills may take up to 3 business days. We ask that you follow-up with your pharmacy.

## 2024-12-18 ENCOUNTER — Encounter: Admitting: Family Medicine

## 2025-02-21 ENCOUNTER — Ambulatory Visit: Payer: Self-pay | Admitting: Adult Health

## 2025-02-22 ENCOUNTER — Ambulatory Visit: Admitting: Adult Health

## 2025-02-26 ENCOUNTER — Ambulatory Visit: Admitting: Adult Health
